# Patient Record
Sex: Female | Born: 1955 | Race: White | Hispanic: No | Smoking: Former smoker
Health system: Southern US, Community
[De-identification: ages and names within clinical notes are randomized; demographics above are authoritative.]

## PROBLEM LIST (undated history)

## (undated) DIAGNOSIS — E559 Vitamin D deficiency, unspecified: Secondary | ICD-10-CM

## (undated) DIAGNOSIS — Z9889 Other specified postprocedural states: Secondary | ICD-10-CM

## (undated) DIAGNOSIS — I447 Left bundle-branch block, unspecified: Secondary | ICD-10-CM

## (undated) DIAGNOSIS — Z95810 Presence of automatic (implantable) cardiac defibrillator: Secondary | ICD-10-CM

## (undated) DIAGNOSIS — I4891 Unspecified atrial fibrillation: Secondary | ICD-10-CM

## (undated) DIAGNOSIS — F419 Anxiety disorder, unspecified: Secondary | ICD-10-CM

## (undated) DIAGNOSIS — C73 Malignant neoplasm of thyroid gland: Secondary | ICD-10-CM

## (undated) DIAGNOSIS — G4710 Hypersomnia, unspecified: Secondary | ICD-10-CM

## (undated) DIAGNOSIS — R112 Nausea with vomiting, unspecified: Secondary | ICD-10-CM

## (undated) DIAGNOSIS — J449 Chronic obstructive pulmonary disease, unspecified: Secondary | ICD-10-CM

## (undated) DIAGNOSIS — E079 Disorder of thyroid, unspecified: Secondary | ICD-10-CM

## (undated) DIAGNOSIS — F32A Depression, unspecified: Secondary | ICD-10-CM

## (undated) DIAGNOSIS — E119 Type 2 diabetes mellitus without complications: Secondary | ICD-10-CM

## (undated) DIAGNOSIS — R7309 Other abnormal glucose: Secondary | ICD-10-CM

## (undated) DIAGNOSIS — E785 Hyperlipidemia, unspecified: Secondary | ICD-10-CM

## (undated) DIAGNOSIS — I10 Essential (primary) hypertension: Secondary | ICD-10-CM

## (undated) DIAGNOSIS — F329 Major depressive disorder, single episode, unspecified: Secondary | ICD-10-CM

## (undated) DIAGNOSIS — R569 Unspecified convulsions: Secondary | ICD-10-CM

## (undated) DIAGNOSIS — N63 Unspecified lump in unspecified breast: Secondary | ICD-10-CM

## (undated) DIAGNOSIS — I428 Other cardiomyopathies: Secondary | ICD-10-CM

## (undated) DIAGNOSIS — J45909 Unspecified asthma, uncomplicated: Secondary | ICD-10-CM

## (undated) DIAGNOSIS — Z950 Presence of cardiac pacemaker: Secondary | ICD-10-CM

## (undated) DIAGNOSIS — I456 Pre-excitation syndrome: Secondary | ICD-10-CM

## (undated) HISTORY — PX: ABDOMINAL HYSTERECTOMY: SHX81

## (undated) HISTORY — PX: CHOLECYSTECTOMY: SHX55

## (undated) HISTORY — DX: Anxiety disorder, unspecified: F41.9

## (undated) HISTORY — DX: Disorder of thyroid, unspecified: E07.9

## (undated) HISTORY — PX: COLONOSCOPY: SHX174

## (undated) HISTORY — DX: Depression, unspecified: F32.A

## (undated) HISTORY — DX: Other cardiomyopathies: I42.8

## (undated) HISTORY — DX: Left bundle-branch block, unspecified: I44.7

## (undated) HISTORY — DX: Unspecified lump in unspecified breast: N63.0

## (undated) HISTORY — DX: Major depressive disorder, single episode, unspecified: F32.9

## (undated) HISTORY — DX: Essential (primary) hypertension: I10

## (undated) HISTORY — DX: Hyperlipidemia, unspecified: E78.5

## (undated) HISTORY — DX: Vitamin D deficiency, unspecified: E55.9

## (undated) HISTORY — DX: Presence of automatic (implantable) cardiac defibrillator: Z95.810

## (undated) HISTORY — PX: JOINT REPLACEMENT: SHX530

## (undated) HISTORY — DX: Hypersomnia, unspecified: G47.10

## (undated) HISTORY — DX: Other abnormal glucose: R73.09

## (undated) HISTORY — PX: SHOULDER SURGERY: SHX246

## (undated) HISTORY — PX: CARDIAC VALVE REPLACEMENT: SHX585

## (undated) HISTORY — DX: Malignant neoplasm of thyroid gland: C73

## (undated) HISTORY — PX: PACEMAKER PLACEMENT: SHX43

## (undated) HISTORY — DX: Unspecified atrial fibrillation: I48.91

---

## 2011-04-23 ENCOUNTER — Encounter: Payer: Self-pay | Admitting: Cardiothoracic Surgery

## 2011-04-23 ENCOUNTER — Institutional Professional Consult (permissible substitution) (INDEPENDENT_AMBULATORY_CARE_PROVIDER_SITE_OTHER): Payer: Medicare Other | Admitting: Cardiothoracic Surgery

## 2011-04-23 VITALS — BP 110/70 | HR 70 | Resp 18 | Ht 68.0 in | Wt 224.0 lb

## 2011-04-23 DIAGNOSIS — F411 Generalized anxiety disorder: Secondary | ICD-10-CM

## 2011-04-23 DIAGNOSIS — F419 Anxiety disorder, unspecified: Secondary | ICD-10-CM | POA: Insufficient documentation

## 2011-04-23 DIAGNOSIS — C73 Malignant neoplasm of thyroid gland: Secondary | ICD-10-CM

## 2011-04-23 DIAGNOSIS — N63 Unspecified lump in unspecified breast: Secondary | ICD-10-CM

## 2011-04-23 DIAGNOSIS — G4710 Hypersomnia, unspecified: Secondary | ICD-10-CM

## 2011-04-23 DIAGNOSIS — I428 Other cardiomyopathies: Secondary | ICD-10-CM

## 2011-04-23 DIAGNOSIS — I4891 Unspecified atrial fibrillation: Secondary | ICD-10-CM | POA: Insufficient documentation

## 2011-04-23 DIAGNOSIS — F329 Major depressive disorder, single episode, unspecified: Secondary | ICD-10-CM

## 2011-04-23 DIAGNOSIS — E785 Hyperlipidemia, unspecified: Secondary | ICD-10-CM

## 2011-04-23 DIAGNOSIS — R7309 Other abnormal glucose: Secondary | ICD-10-CM | POA: Insufficient documentation

## 2011-04-23 DIAGNOSIS — Z95810 Presence of automatic (implantable) cardiac defibrillator: Secondary | ICD-10-CM | POA: Insufficient documentation

## 2011-04-23 DIAGNOSIS — I10 Essential (primary) hypertension: Secondary | ICD-10-CM

## 2011-04-23 DIAGNOSIS — F32A Depression, unspecified: Secondary | ICD-10-CM | POA: Insufficient documentation

## 2011-04-23 DIAGNOSIS — E559 Vitamin D deficiency, unspecified: Secondary | ICD-10-CM | POA: Insufficient documentation

## 2011-04-23 DIAGNOSIS — E079 Disorder of thyroid, unspecified: Secondary | ICD-10-CM

## 2011-04-23 DIAGNOSIS — I447 Left bundle-branch block, unspecified: Secondary | ICD-10-CM | POA: Insufficient documentation

## 2011-04-23 NOTE — Progress Notes (Signed)
[address]            [place] [address]          [phone]      SHIMIKA AMES [facility] Medical Record #[id number] Date of Birth: December 31, 1955  Referring: Sandy Salaam, MD Primary Care: No primary provider on file.  Chief Complaint:    Chief Complaint  Patient presents with  . 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 Subsequently in 1999 underwent mitral valve replacement with a 27 MS-601 St. Jude's mechanical valve serial #[id number]. She has developed a cardiomyopathy with ejection fraction 25-30%.  4 years ago and AICD pacer was placed, attempt to cannulate the coronary sinus was unsuccessful. Because of the patient's increasing evidence of heart failure and decrease in LV function cardiology had recommended ointment of epicardial left ventricular pacing lead for biventricular pacing.         Past Medical History  Diagnosis Date  . Anxiety   . Depression   . Hypertension   . Hyperlipidemia   . Thyroid disease   . Other abnormal glucose   . Automatic implantable cardiac defibrillator in situ     Lumax DR-T 09-20-2007 Dr.Akbary  . Atrial fibrillation   . Other left bundle branch block   . Other primary cardiomyopathies   . 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 Lump or mass in breast     at the 7 o'clock position of right breast(non-tender)  . Unspecified vitamin D deficiency   . Thyroid cancer     Past Surgical History  Procedure Date  . Cardiac valve replacement     st jude silzone mitray mechanical 24ms-601 ss# [id number]  . Colonoscopy     April 12, 2008  . Pacemaker placement     Family History  Problem Relation Age of Onset  . Lung cancer Father     expired 58  . Sarcoidosis Mother    espired 2013    History   Social History  . Marital Status: Legally Separated    Spouse Name: N/A    Number of Children: N/A  . Years of Education: N/A   Occupational History  . Disabled for past 4 years   Social History Main Topics  . Smoking status: Former Smoker -- 1.0 packs/day for 25 years    Types: Cigarettes    Quit date: 10/17/2009  . Smokeless tobacco: Not on file  . Alcohol Use: No  . Drug Use: No  . Sexually Active: Not on file   Other Topics Concern  . Not on file   Social History Narrative  . No narrative on file    History  Smoking status  . Former Smoker -- 1.0 packs/day for 25 years  . Types: Cigarettes  . Quit date: 10/17/2009  Smokeless tobacco  . Not on file    History  Alcohol Use No     Allergies  Allergen Reactions  . Codeine     Current Outpatient Prescriptions  Medication Sig Dispense Refill  . ALPRAZolam (XANAX) 1 MG tablet Take 1 mg by mouth daily.      Marland Kitchen  amiodarone (PACERONE) 200 MG tablet Take 200 mg by mouth daily.      . carvedilol (COREG) 25 MG tablet Take 1/2 tablet q am and one tablet q pm      . CLOPIDOGREL BISULFATE PO Take 75 mg by mouth daily.      . fenofibrate 160 MG tablet Take 160 mg by mouth daily.      Marland Kitchen FLUOXETINE HCL PO Take 40 mg by mouth daily.      . furosemide (LASIX) 40 MG tablet Take 40 mg by mouth daily as needed.      Marland Kitchen LEVOTHYROXINE SODIUM PO Take 200 mcg by mouth daily.      Marland Kitchen lisinopril (PRINIVIL,ZESTRIL) 40 MG tablet Take 40 mg by mouth daily.      Marland Kitchen oxyCODONE-acetaminophen (PERCOCET) 7.5-325 MG per tablet Take 1 tablet by mouth every 6 (six) hours as needed.      . Potassium Chloride Crys CR (POTASSIUM CHLORIDE CRYS ER PO) Take 20 mcg by mouth daily as needed.      . simvastatin (ZOCOR) 40 MG tablet Take 40 mg by mouth every evening.      . warfarin (COUMADIN) 2 MG tablet 1 tablet daily except 1/2 tablet on Sunday & Wednesday           Review of Systems:     Cardiac Review of Systems: Y or  N  Chest Pain [ n   ]  Resting SOB [ n  ] Exertional SOB  [ y ]  Pollyann Kennedy Milo.Brash  ]   Pedal Edema [ n  ]    Palpitations [place].Etienne  ] Syncope  [ n ]   Presyncope [n  ]  General Review of Systems: [Y] = yes [  ]=no Constitional: recent weight change [y gain  25 lbs  ]; anorexia [  ]; fatigue [  ]; nausea [  ]; night sweats [  ]; fever [  ]; or chills [  ];                                                                                                                                          Dental: poor dentition[ n ];   Eye : blurred vision [  ]; diplopia [   ]; vision changes [  ];  Amaurosis fugax[  ]; Resp: cough [  ];  wheezing[  ];  hemoptysis[  ]; shortness of breath[  ]; paroxysmal nocturnal dyspnea[  ]; dyspnea on exertion[  ]; or orthopnea[  ];  GI:  gallstones[  ], vomiting[  ];  dysphagia[  ]; melena[  ];  hematochezia [  ]; heartburn[  ];   Hx of  Colonoscopy[  ]; GU: kidney stones [  ]; hematuria[  ];   dysuria [  ];  nocturia[  ];  history of     obstruction [  ];  Skin: rash, swelling[  ];, hair loss[  ];  peripheral edema[n];  or itching[  ]; Musculosketetal: myalgias[  ];  joint swelling[  ];  joint erythema[  ];  joint pain[  ];  back pain[  ];  Heme/Lymph: bruising[  ];  bleeding[  ];  anemia[  ];  Neuro: TIA[  ];  headaches[  ];  stroke[n  ];  vertigo[n  ];  seizures[n  ];   paresthesias[  ];  difficulty walking[  ];  Psych:depression[  ]; anxiety[  ];  Endocrine: diabetes[  ];  thyroid dysfunction[  ];  Immunizations: Flu [ n ]; Pneumococcal[ n ];  Other:  Physical Exam: BP 110/70  Pulse 70  Resp 18  Ht 5\' 8"  (1.727 m)  Wt 224 lb (101.606 kg)  BMI 34.06 kg/m2  General appearance: alert, cooperative and appears older than stated age Neurologic: intact Heart: irregularly irregular rhythm Lungs: clear to auscultation bilaterally Abdomen: soft, non-tender; bowel sounds normal; no masses,  no organomegaly Extremities: extremities normal, atraumatic, no cyanosis or  edema and Homans sign is negative, no sign of DVT AICD device well-healed in the left infraclavicular subcutaneous pocket  Diagnostic Studies & Laboratory data:     Recent Radiology Findings:   No results found.    Recent Lab Findings: No results found for this basename: WBC, HGB, HCT, PLT, GLUCOSE, CHOL, TRIG, HDL, LDLDIRECT, LDLCALC, ALT, AST, NA, K, CL, CREATININE, BUN, CO2, TSH, INR, GLUF, HGBA1C      Assessment / Plan:     The patient is seen preoperatively to discuss open placement of left ventricular epicardial lead for biventricular pacing. She is discussed with cardiology the need to revise the AICD device and put an additional lead to achieve biventricular pacing after failure to place a coronary sinus lead. I discussed in detail the risks and options of this procedure with the patient and her sisters.With her mechanical valve she will need bridging with Lovenox prior to surgery. She will stop her Plavix today, already has a scheduled appointment in the Coumadin clinic tomorrow depending her pro time will decide when to start Lovenox. I have coordinated with cardiology to proceed with surgery on March 26 Tuesday.  The goals risks and alternatives of the planned surgical procedure left video-assisted thoracoscopy minithoracotomy placement of epicardial pacing leads and revision of AICD device  have been discussed with the patient in detail. The risks of the procedure including death, infection, stroke, myocardial infarction, bleeding, blood transfusion have all been discussed specifically.  I have quoted Fabian November a 2 % of perioperative mortality and a complication rate as high as 15 %. The patient's questions have been answered.AKIAH BAUCH is willing  to proceed with the planned procedure.       Delight Ovens MD  Beeper [phone] Office [phone] 04/23/2011 6:02 PM

## 2011-04-28 DIAGNOSIS — I428 Other cardiomyopathies: Secondary | ICD-10-CM

## 2011-05-14 ENCOUNTER — Ambulatory Visit: Payer: Managed Care, Other (non HMO) | Admitting: Cardiothoracic Surgery

## 2011-05-15 ENCOUNTER — Institutional Professional Consult (permissible substitution) (INDEPENDENT_AMBULATORY_CARE_PROVIDER_SITE_OTHER): Payer: Medicare Other | Admitting: Cardiothoracic Surgery

## 2011-05-15 DIAGNOSIS — Z789 Other specified health status: Secondary | ICD-10-CM

## 2011-05-15 DIAGNOSIS — Z87898 Personal history of other specified conditions: Secondary | ICD-10-CM

## 2011-05-15 NOTE — Progress Notes (Signed)
[address]            [place] [address]          [phone]      Ana Martin [facility] Medical Record #[id number] Date of Birth: [birth year]  Referring: Dr Karren Burly Primary Care: No primary provider on file.  Chief Complaint:   POST OP FOLLOW UP  History of Present Illness:     Patient returns today after after left mini thoracotomy for placement of epicardial pacing leads and revision of the AICD. The patient has been on Coumadin and Plavix chronically with an implanted mechanical St. Jude valve. She notes she saw Dr. Karren Burly, 2 days ago and her AICD device and thresholds were reported as excellent. Just since discharge she has felt fine without any symptoms of congestive heart failure currently under current treatment regimen. She comes to the office today after going to the Coumadin clinic. She was told her INR/PT was "perfect".     Past Medical History  Diagnosis Date  . Anxiety   . Depression   . Hypertension   . Hyperlipidemia   . Thyroid disease   . Other abnormal glucose   . Automatic implantable cardiac defibrillator in situ     Lumax DR-T 09-20-2007 Dr.Akbary  . Atrial fibrillation   . Other left bundle branch block   . Other primary cardiomyopathies   . 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 Lump or mass in breast     at the 7 o'clock position of right breast(non-tender)  . Unspecified vitamin D deficiency   . Thyroid cancer      History  Smoking status  . Former Smoker -- 1.0 packs/day for 25 years  . Types: Cigarettes  . Quit date: 10/17/2009  Smokeless tobacco  . Not on file    History  Alcohol Use No     Allergies  Allergen Reactions  . Codeine     Current Outpatient Prescriptions  Medication Sig Dispense Refill  . ALPRAZolam (XANAX) 1 MG tablet Take 1 mg by mouth daily.      Marland Kitchen amiodarone (PACERONE) 200 MG tablet Take 200 mg by mouth daily.      . carvedilol (COREG) 25  MG tablet Take 1/2 tablet q am and one tablet q pm      . CLOPIDOGREL BISULFATE PO Take 75 mg by mouth daily.      . fenofibrate 160 MG tablet Take 160 mg by mouth daily.      Marland Kitchen FLUOXETINE HCL PO Take 40 mg by mouth daily.      . furosemide (LASIX) 40 MG tablet Take 40 mg by mouth daily as needed.      Marland Kitchen LEVOTHYROXINE SODIUM PO Take 200 mcg by mouth daily.      Marland Kitchen lisinopril (PRINIVIL,ZESTRIL) 40 MG tablet Take 40 mg by mouth daily.      Marland Kitchen oxyCODONE-acetaminophen (PERCOCET) 7.5-325 MG per tablet Take 1 tablet by mouth every 6 (six) hours as needed.      . Potassium Chloride Crys CR (POTASSIUM CHLORIDE CRYS ER PO) Take 20 mcg by mouth daily as needed.      . simvastatin (ZOCOR) 40 MG tablet Take 40 mg by mouth every evening.      . warfarin (COUMADIN) 2 MG tablet 1 tablet daily except 1/2 tablet on Sunday & Wednesday  Physical Exam: There were no vitals taken for this visit.  General appearance: alert, cooperative, appears older than stated age and no distress Neurologic: intact Heart: Valve sounds were crisp, no murmur of mitral insufficiency. With irregular heart rhythm Lungs: clear to auscultation bilaterally and normal percussion bilaterally Abdomen: soft, non-tender; bowel sounds normal; no masses,  no organomegaly Wound: The left infraclavicular wound is with some bruising, the wound is intact without evidence of infection or drainage the swelling and edema around the device present initially postop is almost resolved the left thoracotomy incision   Diagnostic Studies & Laboratory data:     Recent Radiology Findings:   chest x-[facility] was done in [facility] and shows clear lung fields and good position of leads there is no evidence of effusion,    Recent Lab Findings: No results found for this basename: WBC,  HGB,  HCT,  PLT,  GLUCOSE,  CHOL,  TRIG,  HDL,  LDLDIRECT,  LDLCALC,  ALT,  AST,  NA,  K,  CL,  CREATININE,  BUN,  CO2,  TSH,  INR,  GLUF,  HGBA1C       Assessment / Plan:    Patient is stable status post mini thoracotomy and placement of epicardial raising leads and revision of AICD device She will return to see [place] when necessary as requested by Dr. Karren Burly.        Delight Ovens MD  Beeper [phone] Office [phone] 05/15/2011 4:22 PM

## 2012-12-23 DIAGNOSIS — R569 Unspecified convulsions: Secondary | ICD-10-CM | POA: Insufficient documentation

## 2012-12-23 DIAGNOSIS — Z9889 Other specified postprocedural states: Secondary | ICD-10-CM | POA: Insufficient documentation

## 2012-12-23 DIAGNOSIS — Z952 Presence of prosthetic heart valve: Secondary | ICD-10-CM | POA: Insufficient documentation

## 2012-12-23 DIAGNOSIS — S42309A Unspecified fracture of shaft of humerus, unspecified arm, initial encounter for closed fracture: Secondary | ICD-10-CM | POA: Insufficient documentation

## 2012-12-29 DIAGNOSIS — D62 Acute posthemorrhagic anemia: Secondary | ICD-10-CM | POA: Insufficient documentation

## 2015-01-24 DIAGNOSIS — Z7901 Long term (current) use of anticoagulants: Secondary | ICD-10-CM | POA: Insufficient documentation

## 2015-02-06 DIAGNOSIS — R918 Other nonspecific abnormal finding of lung field: Secondary | ICD-10-CM | POA: Insufficient documentation

## 2015-02-06 DIAGNOSIS — R748 Abnormal levels of other serum enzymes: Secondary | ICD-10-CM | POA: Insufficient documentation

## 2015-02-06 DIAGNOSIS — G4733 Obstructive sleep apnea (adult) (pediatric): Secondary | ICD-10-CM | POA: Insufficient documentation

## 2015-02-06 DIAGNOSIS — R740 Nonspecific elevation of levels of transaminase and lactic acid dehydrogenase [LDH]: Secondary | ICD-10-CM

## 2015-02-06 DIAGNOSIS — R7989 Other specified abnormal findings of blood chemistry: Secondary | ICD-10-CM | POA: Insufficient documentation

## 2015-02-06 DIAGNOSIS — J45909 Unspecified asthma, uncomplicated: Secondary | ICD-10-CM | POA: Insufficient documentation

## 2015-02-06 DIAGNOSIS — R7401 Elevation of levels of liver transaminase levels: Secondary | ICD-10-CM | POA: Insufficient documentation

## 2015-02-06 DIAGNOSIS — I059 Rheumatic mitral valve disease, unspecified: Secondary | ICD-10-CM | POA: Insufficient documentation

## 2015-02-20 DIAGNOSIS — E890 Postprocedural hypothyroidism: Secondary | ICD-10-CM | POA: Insufficient documentation

## 2015-04-28 DIAGNOSIS — F339 Major depressive disorder, recurrent, unspecified: Secondary | ICD-10-CM | POA: Insufficient documentation

## 2015-04-28 DIAGNOSIS — I272 Pulmonary hypertension, unspecified: Secondary | ICD-10-CM | POA: Insufficient documentation

## 2015-04-28 DIAGNOSIS — I509 Heart failure, unspecified: Secondary | ICD-10-CM | POA: Insufficient documentation

## 2015-08-20 DIAGNOSIS — Z8679 Personal history of other diseases of the circulatory system: Secondary | ICD-10-CM | POA: Insufficient documentation

## 2016-01-16 DIAGNOSIS — Z85850 Personal history of malignant neoplasm of thyroid: Secondary | ICD-10-CM | POA: Insufficient documentation

## 2016-01-16 DIAGNOSIS — Z135 Encounter for screening for eye and ear disorders: Secondary | ICD-10-CM | POA: Insufficient documentation

## 2016-01-16 DIAGNOSIS — R7303 Prediabetes: Secondary | ICD-10-CM | POA: Insufficient documentation

## 2016-02-07 DIAGNOSIS — Z5181 Encounter for therapeutic drug level monitoring: Secondary | ICD-10-CM | POA: Diagnosis not present

## 2016-02-07 DIAGNOSIS — I272 Pulmonary hypertension, unspecified: Secondary | ICD-10-CM | POA: Diagnosis not present

## 2016-02-07 DIAGNOSIS — Z952 Presence of prosthetic heart valve: Secondary | ICD-10-CM | POA: Diagnosis not present

## 2016-02-07 DIAGNOSIS — Z9889 Other specified postprocedural states: Secondary | ICD-10-CM | POA: Diagnosis not present

## 2016-02-07 DIAGNOSIS — R791 Abnormal coagulation profile: Secondary | ICD-10-CM | POA: Diagnosis not present

## 2016-02-07 DIAGNOSIS — I4892 Unspecified atrial flutter: Secondary | ICD-10-CM | POA: Diagnosis not present

## 2016-02-07 DIAGNOSIS — Z8679 Personal history of other diseases of the circulatory system: Secondary | ICD-10-CM | POA: Diagnosis not present

## 2016-02-07 DIAGNOSIS — I5022 Chronic systolic (congestive) heart failure: Secondary | ICD-10-CM | POA: Diagnosis not present

## 2016-02-07 DIAGNOSIS — Z95810 Presence of automatic (implantable) cardiac defibrillator: Secondary | ICD-10-CM | POA: Diagnosis not present

## 2016-02-07 DIAGNOSIS — G4733 Obstructive sleep apnea (adult) (pediatric): Secondary | ICD-10-CM | POA: Diagnosis not present

## 2016-02-07 DIAGNOSIS — I10 Essential (primary) hypertension: Secondary | ICD-10-CM | POA: Diagnosis not present

## 2016-02-07 DIAGNOSIS — Z7901 Long term (current) use of anticoagulants: Secondary | ICD-10-CM | POA: Diagnosis not present

## 2016-02-07 DIAGNOSIS — I059 Rheumatic mitral valve disease, unspecified: Secondary | ICD-10-CM | POA: Diagnosis not present

## 2016-02-07 DIAGNOSIS — Z4502 Encounter for adjustment and management of automatic implantable cardiac defibrillator: Secondary | ICD-10-CM | POA: Diagnosis not present

## 2016-02-11 DIAGNOSIS — Z5181 Encounter for therapeutic drug level monitoring: Secondary | ICD-10-CM | POA: Diagnosis not present

## 2016-02-11 DIAGNOSIS — Z952 Presence of prosthetic heart valve: Secondary | ICD-10-CM | POA: Diagnosis not present

## 2016-02-11 DIAGNOSIS — Z7901 Long term (current) use of anticoagulants: Secondary | ICD-10-CM | POA: Diagnosis not present

## 2016-02-11 DIAGNOSIS — I059 Rheumatic mitral valve disease, unspecified: Secondary | ICD-10-CM | POA: Diagnosis not present

## 2016-02-27 DIAGNOSIS — I059 Rheumatic mitral valve disease, unspecified: Secondary | ICD-10-CM | POA: Diagnosis not present

## 2016-02-27 DIAGNOSIS — Z952 Presence of prosthetic heart valve: Secondary | ICD-10-CM | POA: Diagnosis not present

## 2016-02-27 DIAGNOSIS — Z5181 Encounter for therapeutic drug level monitoring: Secondary | ICD-10-CM | POA: Diagnosis not present

## 2016-03-17 DIAGNOSIS — I059 Rheumatic mitral valve disease, unspecified: Secondary | ICD-10-CM | POA: Diagnosis not present

## 2016-03-17 DIAGNOSIS — Z5181 Encounter for therapeutic drug level monitoring: Secondary | ICD-10-CM | POA: Diagnosis not present

## 2016-03-17 DIAGNOSIS — Z952 Presence of prosthetic heart valve: Secondary | ICD-10-CM | POA: Diagnosis not present

## 2016-04-17 DIAGNOSIS — Z952 Presence of prosthetic heart valve: Secondary | ICD-10-CM | POA: Diagnosis not present

## 2016-04-17 DIAGNOSIS — Z5181 Encounter for therapeutic drug level monitoring: Secondary | ICD-10-CM | POA: Diagnosis not present

## 2016-04-17 DIAGNOSIS — Z7901 Long term (current) use of anticoagulants: Secondary | ICD-10-CM | POA: Diagnosis not present

## 2016-04-18 DIAGNOSIS — Z8679 Personal history of other diseases of the circulatory system: Secondary | ICD-10-CM | POA: Diagnosis not present

## 2016-04-18 DIAGNOSIS — I5022 Chronic systolic (congestive) heart failure: Secondary | ICD-10-CM | POA: Diagnosis not present

## 2016-04-18 DIAGNOSIS — I059 Rheumatic mitral valve disease, unspecified: Secondary | ICD-10-CM | POA: Diagnosis not present

## 2016-04-18 DIAGNOSIS — I471 Supraventricular tachycardia: Secondary | ICD-10-CM | POA: Diagnosis not present

## 2016-04-18 DIAGNOSIS — Z95810 Presence of automatic (implantable) cardiac defibrillator: Secondary | ICD-10-CM | POA: Diagnosis not present

## 2016-04-18 DIAGNOSIS — Z9889 Other specified postprocedural states: Secondary | ICD-10-CM | POA: Diagnosis not present

## 2016-04-18 DIAGNOSIS — Z7901 Long term (current) use of anticoagulants: Secondary | ICD-10-CM | POA: Diagnosis not present

## 2016-04-18 DIAGNOSIS — I4892 Unspecified atrial flutter: Secondary | ICD-10-CM | POA: Diagnosis not present

## 2016-04-18 DIAGNOSIS — I10 Essential (primary) hypertension: Secondary | ICD-10-CM | POA: Diagnosis not present

## 2016-04-24 DIAGNOSIS — I4892 Unspecified atrial flutter: Secondary | ICD-10-CM | POA: Diagnosis not present

## 2016-04-24 DIAGNOSIS — Z9889 Other specified postprocedural states: Secondary | ICD-10-CM | POA: Diagnosis not present

## 2016-04-24 DIAGNOSIS — Z95810 Presence of automatic (implantable) cardiac defibrillator: Secondary | ICD-10-CM | POA: Diagnosis not present

## 2016-04-24 DIAGNOSIS — I10 Essential (primary) hypertension: Secondary | ICD-10-CM | POA: Diagnosis not present

## 2016-04-24 DIAGNOSIS — Z7901 Long term (current) use of anticoagulants: Secondary | ICD-10-CM | POA: Diagnosis not present

## 2016-04-24 DIAGNOSIS — I5022 Chronic systolic (congestive) heart failure: Secondary | ICD-10-CM | POA: Diagnosis not present

## 2016-04-24 DIAGNOSIS — I4891 Unspecified atrial fibrillation: Secondary | ICD-10-CM | POA: Diagnosis not present

## 2016-04-24 DIAGNOSIS — Z8679 Personal history of other diseases of the circulatory system: Secondary | ICD-10-CM | POA: Diagnosis not present

## 2016-05-08 DIAGNOSIS — I10 Essential (primary) hypertension: Secondary | ICD-10-CM | POA: Diagnosis not present

## 2016-05-08 DIAGNOSIS — I059 Rheumatic mitral valve disease, unspecified: Secondary | ICD-10-CM | POA: Diagnosis not present

## 2016-05-08 DIAGNOSIS — Z5181 Encounter for therapeutic drug level monitoring: Secondary | ICD-10-CM | POA: Diagnosis not present

## 2016-05-08 DIAGNOSIS — I4891 Unspecified atrial fibrillation: Secondary | ICD-10-CM | POA: Diagnosis not present

## 2016-05-08 DIAGNOSIS — Z952 Presence of prosthetic heart valve: Secondary | ICD-10-CM | POA: Diagnosis not present

## 2016-05-08 DIAGNOSIS — I4892 Unspecified atrial flutter: Secondary | ICD-10-CM | POA: Diagnosis not present

## 2016-05-08 DIAGNOSIS — Z9889 Other specified postprocedural states: Secondary | ICD-10-CM | POA: Diagnosis not present

## 2016-05-08 DIAGNOSIS — Z7901 Long term (current) use of anticoagulants: Secondary | ICD-10-CM | POA: Diagnosis not present

## 2016-05-08 DIAGNOSIS — I5022 Chronic systolic (congestive) heart failure: Secondary | ICD-10-CM | POA: Diagnosis not present

## 2016-05-08 DIAGNOSIS — Z8679 Personal history of other diseases of the circulatory system: Secondary | ICD-10-CM | POA: Diagnosis not present

## 2016-05-08 DIAGNOSIS — Z95810 Presence of automatic (implantable) cardiac defibrillator: Secondary | ICD-10-CM | POA: Diagnosis not present

## 2016-06-19 DIAGNOSIS — H5213 Myopia, bilateral: Secondary | ICD-10-CM | POA: Diagnosis not present

## 2016-06-19 DIAGNOSIS — H52223 Regular astigmatism, bilateral: Secondary | ICD-10-CM | POA: Diagnosis not present

## 2016-06-19 DIAGNOSIS — H524 Presbyopia: Secondary | ICD-10-CM | POA: Diagnosis not present

## 2016-06-25 DIAGNOSIS — I059 Rheumatic mitral valve disease, unspecified: Secondary | ICD-10-CM | POA: Diagnosis not present

## 2016-06-25 DIAGNOSIS — Z952 Presence of prosthetic heart valve: Secondary | ICD-10-CM | POA: Diagnosis not present

## 2016-06-25 DIAGNOSIS — Z5181 Encounter for therapeutic drug level monitoring: Secondary | ICD-10-CM | POA: Diagnosis not present

## 2016-06-25 DIAGNOSIS — Z7901 Long term (current) use of anticoagulants: Secondary | ICD-10-CM | POA: Diagnosis not present

## 2016-07-16 DIAGNOSIS — F324 Major depressive disorder, single episode, in partial remission: Secondary | ICD-10-CM | POA: Diagnosis not present

## 2016-07-16 DIAGNOSIS — E669 Obesity, unspecified: Secondary | ICD-10-CM | POA: Insufficient documentation

## 2016-07-16 DIAGNOSIS — E785 Hyperlipidemia, unspecified: Secondary | ICD-10-CM | POA: Diagnosis not present

## 2016-07-16 DIAGNOSIS — Z1159 Encounter for screening for other viral diseases: Secondary | ICD-10-CM | POA: Diagnosis not present

## 2016-07-16 DIAGNOSIS — R7303 Prediabetes: Secondary | ICD-10-CM | POA: Diagnosis not present

## 2016-07-16 DIAGNOSIS — F411 Generalized anxiety disorder: Secondary | ICD-10-CM | POA: Diagnosis not present

## 2016-07-16 DIAGNOSIS — R41840 Attention and concentration deficit: Secondary | ICD-10-CM | POA: Diagnosis not present

## 2016-07-16 DIAGNOSIS — I10 Essential (primary) hypertension: Secondary | ICD-10-CM | POA: Diagnosis not present

## 2016-07-16 DIAGNOSIS — Z23 Encounter for immunization: Secondary | ICD-10-CM | POA: Diagnosis not present

## 2016-07-16 DIAGNOSIS — E039 Hypothyroidism, unspecified: Secondary | ICD-10-CM | POA: Diagnosis not present

## 2016-07-30 DIAGNOSIS — Z5181 Encounter for therapeutic drug level monitoring: Secondary | ICD-10-CM | POA: Diagnosis not present

## 2016-07-30 DIAGNOSIS — I059 Rheumatic mitral valve disease, unspecified: Secondary | ICD-10-CM | POA: Diagnosis not present

## 2016-07-30 DIAGNOSIS — Z952 Presence of prosthetic heart valve: Secondary | ICD-10-CM | POA: Diagnosis not present

## 2016-07-30 DIAGNOSIS — Z7901 Long term (current) use of anticoagulants: Secondary | ICD-10-CM | POA: Diagnosis not present

## 2016-08-29 DIAGNOSIS — I059 Rheumatic mitral valve disease, unspecified: Secondary | ICD-10-CM | POA: Diagnosis not present

## 2016-08-29 DIAGNOSIS — R791 Abnormal coagulation profile: Secondary | ICD-10-CM | POA: Diagnosis not present

## 2016-08-29 DIAGNOSIS — Z7901 Long term (current) use of anticoagulants: Secondary | ICD-10-CM | POA: Diagnosis not present

## 2016-08-29 DIAGNOSIS — Z5181 Encounter for therapeutic drug level monitoring: Secondary | ICD-10-CM | POA: Diagnosis not present

## 2016-09-17 DIAGNOSIS — I059 Rheumatic mitral valve disease, unspecified: Secondary | ICD-10-CM | POA: Diagnosis not present

## 2016-09-17 DIAGNOSIS — Z7901 Long term (current) use of anticoagulants: Secondary | ICD-10-CM | POA: Diagnosis not present

## 2016-09-17 DIAGNOSIS — Z952 Presence of prosthetic heart valve: Secondary | ICD-10-CM | POA: Diagnosis not present

## 2016-09-17 DIAGNOSIS — Z5181 Encounter for therapeutic drug level monitoring: Secondary | ICD-10-CM | POA: Diagnosis not present

## 2016-10-31 DIAGNOSIS — Z952 Presence of prosthetic heart valve: Secondary | ICD-10-CM | POA: Diagnosis not present

## 2016-10-31 DIAGNOSIS — Z5181 Encounter for therapeutic drug level monitoring: Secondary | ICD-10-CM | POA: Diagnosis not present

## 2016-10-31 DIAGNOSIS — I059 Rheumatic mitral valve disease, unspecified: Secondary | ICD-10-CM | POA: Diagnosis not present

## 2016-10-31 DIAGNOSIS — R791 Abnormal coagulation profile: Secondary | ICD-10-CM | POA: Diagnosis not present

## 2016-11-03 DIAGNOSIS — I10 Essential (primary) hypertension: Secondary | ICD-10-CM | POA: Diagnosis not present

## 2016-11-03 DIAGNOSIS — F411 Generalized anxiety disorder: Secondary | ICD-10-CM | POA: Diagnosis not present

## 2016-11-03 DIAGNOSIS — Z23 Encounter for immunization: Secondary | ICD-10-CM | POA: Diagnosis not present

## 2016-11-03 DIAGNOSIS — E039 Hypothyroidism, unspecified: Secondary | ICD-10-CM | POA: Diagnosis not present

## 2016-11-03 DIAGNOSIS — E785 Hyperlipidemia, unspecified: Secondary | ICD-10-CM | POA: Diagnosis not present

## 2016-12-02 DIAGNOSIS — Z7901 Long term (current) use of anticoagulants: Secondary | ICD-10-CM | POA: Diagnosis not present

## 2016-12-02 DIAGNOSIS — Z952 Presence of prosthetic heart valve: Secondary | ICD-10-CM | POA: Diagnosis not present

## 2016-12-02 DIAGNOSIS — I059 Rheumatic mitral valve disease, unspecified: Secondary | ICD-10-CM | POA: Diagnosis not present

## 2016-12-02 DIAGNOSIS — Z5181 Encounter for therapeutic drug level monitoring: Secondary | ICD-10-CM | POA: Diagnosis not present

## 2017-01-09 DIAGNOSIS — Z5181 Encounter for therapeutic drug level monitoring: Secondary | ICD-10-CM | POA: Diagnosis not present

## 2017-01-09 DIAGNOSIS — I059 Rheumatic mitral valve disease, unspecified: Secondary | ICD-10-CM | POA: Diagnosis not present

## 2017-01-09 DIAGNOSIS — Z952 Presence of prosthetic heart valve: Secondary | ICD-10-CM | POA: Diagnosis not present

## 2017-01-09 DIAGNOSIS — Z7901 Long term (current) use of anticoagulants: Secondary | ICD-10-CM | POA: Diagnosis not present

## 2017-01-29 ENCOUNTER — Other Ambulatory Visit: Payer: Self-pay | Admitting: Pharmacy Technician

## 2017-01-29 NOTE — Patient Outreach (Signed)
[place] [facility]) Care Management  01/29/2017  KAMYRAH FEESER 1955-07-13 [id number]  Incoming [employer] EMMI call in reference to medication adherence. HIPAA identifier's verified and verbal consent received. Patient states she takes all of her medications daily as prescribed and does not have any barriers that would affect her adherence. She has an appointment scheduled on 1/11 and will speak to her provider at that time about getting new prescriptions for 3 month supplies sent in to her pharmacy.   Doreene Burke, [place] [phone]

## 2017-02-06 DIAGNOSIS — Z5181 Encounter for therapeutic drug level monitoring: Secondary | ICD-10-CM | POA: Diagnosis not present

## 2017-02-06 DIAGNOSIS — Z7901 Long term (current) use of anticoagulants: Secondary | ICD-10-CM | POA: Diagnosis not present

## 2017-02-06 DIAGNOSIS — I059 Rheumatic mitral valve disease, unspecified: Secondary | ICD-10-CM | POA: Diagnosis not present

## 2017-02-06 DIAGNOSIS — Z952 Presence of prosthetic heart valve: Secondary | ICD-10-CM | POA: Diagnosis not present

## 2017-02-13 DIAGNOSIS — C44712 Basal cell carcinoma of skin of right lower limb, including hip: Secondary | ICD-10-CM | POA: Diagnosis not present

## 2017-02-13 DIAGNOSIS — I10 Essential (primary) hypertension: Secondary | ICD-10-CM | POA: Diagnosis not present

## 2017-02-13 DIAGNOSIS — C44792 Other specified malignant neoplasm of skin of right lower limb, including hip: Secondary | ICD-10-CM | POA: Diagnosis not present

## 2017-02-13 DIAGNOSIS — R7303 Prediabetes: Secondary | ICD-10-CM | POA: Diagnosis not present

## 2017-02-13 DIAGNOSIS — F324 Major depressive disorder, single episode, in partial remission: Secondary | ICD-10-CM | POA: Diagnosis not present

## 2017-02-13 DIAGNOSIS — E785 Hyperlipidemia, unspecified: Secondary | ICD-10-CM | POA: Diagnosis not present

## 2017-02-26 DIAGNOSIS — C44712 Basal cell carcinoma of skin of right lower limb, including hip: Secondary | ICD-10-CM | POA: Diagnosis not present

## 2017-02-26 DIAGNOSIS — I10 Essential (primary) hypertension: Secondary | ICD-10-CM | POA: Diagnosis not present

## 2017-02-26 DIAGNOSIS — N289 Disorder of kidney and ureter, unspecified: Secondary | ICD-10-CM | POA: Diagnosis not present

## 2017-02-26 DIAGNOSIS — J069 Acute upper respiratory infection, unspecified: Secondary | ICD-10-CM | POA: Diagnosis not present

## 2017-02-26 DIAGNOSIS — R748 Abnormal levels of other serum enzymes: Secondary | ICD-10-CM | POA: Diagnosis not present

## 2017-03-13 DIAGNOSIS — I5022 Chronic systolic (congestive) heart failure: Secondary | ICD-10-CM | POA: Diagnosis not present

## 2017-03-13 DIAGNOSIS — I059 Rheumatic mitral valve disease, unspecified: Secondary | ICD-10-CM | POA: Diagnosis not present

## 2017-03-13 DIAGNOSIS — Z7901 Long term (current) use of anticoagulants: Secondary | ICD-10-CM | POA: Diagnosis not present

## 2017-03-13 DIAGNOSIS — Z4502 Encounter for adjustment and management of automatic implantable cardiac defibrillator: Secondary | ICD-10-CM | POA: Diagnosis not present

## 2017-03-13 DIAGNOSIS — Z952 Presence of prosthetic heart valve: Secondary | ICD-10-CM | POA: Diagnosis not present

## 2017-03-13 DIAGNOSIS — Z5181 Encounter for therapeutic drug level monitoring: Secondary | ICD-10-CM | POA: Diagnosis not present

## 2017-04-14 DIAGNOSIS — Z7901 Long term (current) use of anticoagulants: Secondary | ICD-10-CM | POA: Diagnosis not present

## 2017-04-14 DIAGNOSIS — I110 Hypertensive heart disease with heart failure: Secondary | ICD-10-CM | POA: Diagnosis not present

## 2017-04-14 DIAGNOSIS — Z5181 Encounter for therapeutic drug level monitoring: Secondary | ICD-10-CM | POA: Diagnosis not present

## 2017-04-14 DIAGNOSIS — I5022 Chronic systolic (congestive) heart failure: Secondary | ICD-10-CM | POA: Diagnosis not present

## 2017-04-14 DIAGNOSIS — I059 Rheumatic mitral valve disease, unspecified: Secondary | ICD-10-CM | POA: Diagnosis not present

## 2017-04-14 DIAGNOSIS — Z952 Presence of prosthetic heart valve: Secondary | ICD-10-CM | POA: Diagnosis not present

## 2017-04-14 DIAGNOSIS — I480 Paroxysmal atrial fibrillation: Secondary | ICD-10-CM | POA: Diagnosis not present

## 2017-05-20 DIAGNOSIS — Z7901 Long term (current) use of anticoagulants: Secondary | ICD-10-CM | POA: Diagnosis not present

## 2017-05-20 DIAGNOSIS — Z952 Presence of prosthetic heart valve: Secondary | ICD-10-CM | POA: Diagnosis not present

## 2017-05-20 DIAGNOSIS — I059 Rheumatic mitral valve disease, unspecified: Secondary | ICD-10-CM | POA: Diagnosis not present

## 2017-05-20 DIAGNOSIS — Z5181 Encounter for therapeutic drug level monitoring: Secondary | ICD-10-CM | POA: Diagnosis not present

## 2017-06-11 DIAGNOSIS — I509 Heart failure, unspecified: Secondary | ICD-10-CM | POA: Diagnosis not present

## 2017-06-11 DIAGNOSIS — Z95810 Presence of automatic (implantable) cardiac defibrillator: Secondary | ICD-10-CM | POA: Diagnosis not present

## 2017-07-02 DIAGNOSIS — I059 Rheumatic mitral valve disease, unspecified: Secondary | ICD-10-CM | POA: Diagnosis not present

## 2017-07-02 DIAGNOSIS — Z952 Presence of prosthetic heart valve: Secondary | ICD-10-CM | POA: Diagnosis not present

## 2017-07-02 DIAGNOSIS — Z7901 Long term (current) use of anticoagulants: Secondary | ICD-10-CM | POA: Diagnosis not present

## 2017-07-02 DIAGNOSIS — I480 Paroxysmal atrial fibrillation: Secondary | ICD-10-CM | POA: Diagnosis not present

## 2017-07-21 DIAGNOSIS — R7303 Prediabetes: Secondary | ICD-10-CM | POA: Diagnosis not present

## 2017-07-21 DIAGNOSIS — I10 Essential (primary) hypertension: Secondary | ICD-10-CM | POA: Diagnosis not present

## 2017-07-21 DIAGNOSIS — F411 Generalized anxiety disorder: Secondary | ICD-10-CM | POA: Diagnosis not present

## 2017-07-21 DIAGNOSIS — F324 Major depressive disorder, single episode, in partial remission: Secondary | ICD-10-CM | POA: Diagnosis not present

## 2017-07-21 DIAGNOSIS — E039 Hypothyroidism, unspecified: Secondary | ICD-10-CM | POA: Diagnosis not present

## 2017-08-30 ENCOUNTER — Other Ambulatory Visit: Payer: Self-pay

## 2017-08-30 ENCOUNTER — Emergency Department (HOSPITAL_COMMUNITY): Payer: PPO

## 2017-08-30 ENCOUNTER — Encounter (HOSPITAL_COMMUNITY): Payer: Self-pay | Admitting: Emergency Medicine

## 2017-08-30 ENCOUNTER — Inpatient Hospital Stay (HOSPITAL_COMMUNITY)
Admission: EM | Admit: 2017-08-30 | Discharge: 2017-09-04 | DRG: 917 | Disposition: A | Discharge status: Other Acute Inpatient Hospital | Payer: PPO | Attending: Internal Medicine | Admitting: Internal Medicine

## 2017-08-30 DIAGNOSIS — Z885 Allergy status to narcotic agent status: Secondary | ICD-10-CM

## 2017-08-30 DIAGNOSIS — F419 Anxiety disorder, unspecified: Secondary | ICD-10-CM | POA: Diagnosis present

## 2017-08-30 DIAGNOSIS — T424X2D Poisoning by benzodiazepines, intentional self-harm, subsequent encounter: Secondary | ICD-10-CM | POA: Diagnosis not present

## 2017-08-30 DIAGNOSIS — G92 Toxic encephalopathy: Secondary | ICD-10-CM | POA: Diagnosis present

## 2017-08-30 DIAGNOSIS — M542 Cervicalgia: Secondary | ICD-10-CM | POA: Diagnosis not present

## 2017-08-30 DIAGNOSIS — I429 Cardiomyopathy, unspecified: Secondary | ICD-10-CM | POA: Diagnosis present

## 2017-08-30 DIAGNOSIS — T45511A Poisoning by anticoagulants, accidental (unintentional), initial encounter: Secondary | ICD-10-CM | POA: Diagnosis not present

## 2017-08-30 DIAGNOSIS — Z801 Family history of malignant neoplasm of trachea, bronchus and lung: Secondary | ICD-10-CM

## 2017-08-30 DIAGNOSIS — E039 Hypothyroidism, unspecified: Secondary | ICD-10-CM | POA: Diagnosis present

## 2017-08-30 DIAGNOSIS — I10 Essential (primary) hypertension: Secondary | ICD-10-CM | POA: Diagnosis present

## 2017-08-30 DIAGNOSIS — I447 Left bundle-branch block, unspecified: Secondary | ICD-10-CM | POA: Diagnosis present

## 2017-08-30 DIAGNOSIS — G4710 Hypersomnia, unspecified: Secondary | ICD-10-CM | POA: Diagnosis present

## 2017-08-30 DIAGNOSIS — T45515A Adverse effect of anticoagulants, initial encounter: Secondary | ICD-10-CM | POA: Diagnosis present

## 2017-08-30 DIAGNOSIS — Z7901 Long term (current) use of anticoagulants: Secondary | ICD-10-CM | POA: Diagnosis not present

## 2017-08-30 DIAGNOSIS — E559 Vitamin D deficiency, unspecified: Secondary | ICD-10-CM | POA: Diagnosis present

## 2017-08-30 DIAGNOSIS — I4891 Unspecified atrial fibrillation: Secondary | ICD-10-CM | POA: Diagnosis present

## 2017-08-30 DIAGNOSIS — S3991XA Unspecified injury of abdomen, initial encounter: Secondary | ICD-10-CM | POA: Diagnosis not present

## 2017-08-30 DIAGNOSIS — E785 Hyperlipidemia, unspecified: Secondary | ICD-10-CM | POA: Diagnosis present

## 2017-08-30 DIAGNOSIS — Z23 Encounter for immunization: Secondary | ICD-10-CM

## 2017-08-30 DIAGNOSIS — I361 Nonrheumatic tricuspid (valve) insufficiency: Secondary | ICD-10-CM | POA: Diagnosis not present

## 2017-08-30 DIAGNOSIS — Z6379 Other stressful life events affecting family and household: Secondary | ICD-10-CM | POA: Diagnosis not present

## 2017-08-30 DIAGNOSIS — I428 Other cardiomyopathies: Secondary | ICD-10-CM | POA: Diagnosis not present

## 2017-08-30 DIAGNOSIS — T424X4A Poisoning by benzodiazepines, undetermined, initial encounter: Secondary | ICD-10-CM | POA: Diagnosis not present

## 2017-08-30 DIAGNOSIS — Z915 Personal history of self-harm: Secondary | ICD-10-CM | POA: Diagnosis not present

## 2017-08-30 DIAGNOSIS — Z79890 Hormone replacement therapy (postmenopausal): Secondary | ICD-10-CM

## 2017-08-30 DIAGNOSIS — Z85850 Personal history of malignant neoplasm of thyroid: Secondary | ICD-10-CM | POA: Diagnosis not present

## 2017-08-30 DIAGNOSIS — D689 Coagulation defect, unspecified: Secondary | ICD-10-CM | POA: Diagnosis present

## 2017-08-30 DIAGNOSIS — J441 Chronic obstructive pulmonary disease with (acute) exacerbation: Secondary | ICD-10-CM | POA: Diagnosis present

## 2017-08-30 DIAGNOSIS — Z95810 Presence of automatic (implantable) cardiac defibrillator: Secondary | ICD-10-CM

## 2017-08-30 DIAGNOSIS — R791 Abnormal coagulation profile: Secondary | ICD-10-CM | POA: Diagnosis not present

## 2017-08-30 DIAGNOSIS — S20212A Contusion of left front wall of thorax, initial encounter: Secondary | ICD-10-CM | POA: Diagnosis not present

## 2017-08-30 DIAGNOSIS — F329 Major depressive disorder, single episode, unspecified: Secondary | ICD-10-CM | POA: Diagnosis present

## 2017-08-30 DIAGNOSIS — Z952 Presence of prosthetic heart valve: Secondary | ICD-10-CM | POA: Diagnosis not present

## 2017-08-30 DIAGNOSIS — R52 Pain, unspecified: Secondary | ICD-10-CM | POA: Diagnosis not present

## 2017-08-30 DIAGNOSIS — W19XXXA Unspecified fall, initial encounter: Secondary | ICD-10-CM | POA: Diagnosis not present

## 2017-08-30 DIAGNOSIS — R404 Transient alteration of awareness: Secondary | ICD-10-CM | POA: Diagnosis not present

## 2017-08-30 DIAGNOSIS — I456 Pre-excitation syndrome: Secondary | ICD-10-CM | POA: Diagnosis present

## 2017-08-30 DIAGNOSIS — J9601 Acute respiratory failure with hypoxia: Secondary | ICD-10-CM | POA: Diagnosis present

## 2017-08-30 DIAGNOSIS — G4700 Insomnia, unspecified: Secondary | ICD-10-CM | POA: Diagnosis not present

## 2017-08-30 DIAGNOSIS — W1830XA Fall on same level, unspecified, initial encounter: Secondary | ICD-10-CM | POA: Diagnosis present

## 2017-08-30 DIAGNOSIS — Y92009 Unspecified place in unspecified non-institutional (private) residence as the place of occurrence of the external cause: Secondary | ICD-10-CM

## 2017-08-30 DIAGNOSIS — T424X1A Poisoning by benzodiazepines, accidental (unintentional), initial encounter: Secondary | ICD-10-CM | POA: Diagnosis not present

## 2017-08-30 DIAGNOSIS — J449 Chronic obstructive pulmonary disease, unspecified: Secondary | ICD-10-CM | POA: Diagnosis present

## 2017-08-30 DIAGNOSIS — S20219A Contusion of unspecified front wall of thorax, initial encounter: Secondary | ICD-10-CM | POA: Diagnosis present

## 2017-08-30 DIAGNOSIS — M25512 Pain in left shoulder: Secondary | ICD-10-CM | POA: Diagnosis not present

## 2017-08-30 DIAGNOSIS — Z90710 Acquired absence of both cervix and uterus: Secondary | ICD-10-CM | POA: Diagnosis not present

## 2017-08-30 DIAGNOSIS — R4020 Unspecified coma: Secondary | ICD-10-CM | POA: Diagnosis not present

## 2017-08-30 DIAGNOSIS — F332 Major depressive disorder, recurrent severe without psychotic features: Secondary | ICD-10-CM | POA: Diagnosis not present

## 2017-08-30 DIAGNOSIS — S40022A Contusion of left upper arm, initial encounter: Secondary | ICD-10-CM | POA: Diagnosis present

## 2017-08-30 DIAGNOSIS — Z87891 Personal history of nicotine dependence: Secondary | ICD-10-CM | POA: Diagnosis not present

## 2017-08-30 DIAGNOSIS — R55 Syncope and collapse: Secondary | ICD-10-CM | POA: Diagnosis present

## 2017-08-30 DIAGNOSIS — Z79899 Other long term (current) drug therapy: Secondary | ICD-10-CM

## 2017-08-30 DIAGNOSIS — S42302D Unspecified fracture of shaft of humerus, left arm, subsequent encounter for fracture with routine healing: Secondary | ICD-10-CM | POA: Diagnosis not present

## 2017-08-30 DIAGNOSIS — T424X2A Poisoning by benzodiazepines, intentional self-harm, initial encounter: Secondary | ICD-10-CM | POA: Diagnosis present

## 2017-08-30 DIAGNOSIS — R4182 Altered mental status, unspecified: Secondary | ICD-10-CM | POA: Diagnosis present

## 2017-08-30 DIAGNOSIS — R0781 Pleurodynia: Secondary | ICD-10-CM | POA: Diagnosis present

## 2017-08-30 DIAGNOSIS — T1491XA Suicide attempt, initial encounter: Secondary | ICD-10-CM | POA: Diagnosis not present

## 2017-08-30 HISTORY — DX: Pre-excitation syndrome: I45.6

## 2017-08-30 LAB — COMPREHENSIVE METABOLIC PANEL
ALBUMIN: 3.8 g/dL (ref 3.5–5.0)
ALT: 22 U/L (ref 0–44)
AST: 51 U/L — AB (ref 15–41)
Alkaline Phosphatase: 27 U/L — ABNORMAL LOW (ref 38–126)
Anion gap: 8 (ref 5–15)
BUN: 11 mg/dL (ref 8–23)
CALCIUM: 9 mg/dL (ref 8.9–10.3)
CHLORIDE: 101 mmol/L (ref 98–111)
CO2: 29 mmol/L (ref 22–32)
Creatinine, Ser: 0.84 mg/dL (ref 0.44–1.00)
GFR calc Af Amer: >60 mL/min (ref >60)
GLUCOSE: 82 mg/dL (ref 70–99)
POTASSIUM: 4.1 mmol/L (ref 3.5–5.1)
SODIUM: 138 mmol/L (ref 135–145)
TOTAL PROTEIN: 7.2 g/dL (ref 6.5–8.1)
Total Bilirubin: 1.2 mg/dL (ref 0.3–1.2)

## 2017-08-30 LAB — URINALYSIS, ROUTINE W REFLEX MICROSCOPIC
Bilirubin Urine: NEGATIVE
GLUCOSE, UA: NEGATIVE mg/dL
Hgb urine dipstick: NEGATIVE
Ketones, ur: NEGATIVE mg/dL
Leukocytes, UA: NEGATIVE
NITRITE: NEGATIVE
PROTEIN: NEGATIVE mg/dL
Specific Gravity, Urine: 1.005 (ref 1.005–1.030)
pH: 6 (ref 5.0–8.0)

## 2017-08-30 LAB — CBC
HCT: 36 % (ref 36.0–46.0)
Hemoglobin: 11.1 g/dL — ABNORMAL LOW (ref 12.0–15.0)
MCH: 27.7 pg (ref 26.0–34.0)
MCHC: 30.8 g/dL (ref 30.0–36.0)
MCV: 89.8 fL (ref 78.0–100.0)
PLATELETS: 166 10*3/uL (ref 150–400)
RBC: 4.01 MIL/uL (ref 3.87–5.11)
RDW: 15.4 % (ref 11.5–15.5)
WBC: 5.4 10*3/uL (ref 4.0–10.5)

## 2017-08-30 LAB — BLOOD GAS, VENOUS
ACID-BASE EXCESS: 4.1 mmol/L — AB (ref 0.0–2.0)
Bicarbonate: 26.4 mmol/L (ref 20.0–28.0)
Drawn by: 105551
FIO2: 21
O2 SAT: 55 %
pCO2, Ven: 58.8 mmHg (ref 44.0–60.0)
pH, Ven: 7.324 (ref 7.250–7.430)
pO2, Ven: 34.7 mmHg (ref 32.0–45.0)

## 2017-08-30 LAB — RAPID URINE DRUG SCREEN, HOSP PERFORMED
Amphetamines: POSITIVE — AB
BARBITURATES: NOT DETECTED
Benzodiazepines: POSITIVE — AB
COCAINE: NOT DETECTED
OPIATES: NOT DETECTED
Tetrahydrocannabinol: NOT DETECTED

## 2017-08-30 LAB — ETHANOL

## 2017-08-30 LAB — PROTIME-INR: Prothrombin Time: >90 seconds — ABNORMAL HIGH (ref 11.4–15.2)

## 2017-08-30 MED ORDER — VITAMIN K1 10 MG/ML IJ SOLN
5.0000 mg | Freq: Once | INTRAMUSCULAR | Status: AC
  Administered 2017-08-31: 5 mg via SUBCUTANEOUS
  Filled 2017-08-30: qty 1

## 2017-08-30 MED ORDER — SODIUM CHLORIDE 0.9 % IV SOLN
10.0000 mL/h | Freq: Once | INTRAVENOUS | Status: AC
  Administered 2017-08-31: 10 mL/h via INTRAVENOUS

## 2017-08-30 MED ORDER — PHYTONADIONE 5 MG PO TABS: 2.5000 mg | ORAL_TABLET | Freq: Once | ORAL | Status: DC

## 2017-08-30 NOTE — ED Notes (Signed)
Date and time results received: 08/30/17 2332   Test: PT Critical Value: >90  Name of Provider Notified: Rancour, MD

## 2017-08-30 NOTE — ED Provider Notes (Signed)
[facility] EMERGENCY DEPARTMENT Provider Note   CSN: [id number] Arrival date & time: 08/30/17  2200     History   Chief Complaint Chief Complaint  Patient presents with  . Altered Mental Status    HPI Ana Martin is a 62 y.o. female.  HPI   Ana Martin is a 62 y.o. female with past medical history of atrial fibrillation, thyroid cancer, WPW, mechanical heart valve, hypertension and anxiety who presents to the Emergency Department by EMS for fall earlier today.  Patient states that she fell forward into her closet earlier today and complains of pain and bruising to her left arm, neck pain , upper chest wall and right groin pain.  Per EMS, they were contacted by family members who states that she took an unknown amount of Xanax today.  Upon arrival,  EMS states patient was arousable.  Patient currently takes Plavix and Coumadin due to a history of atrial fibrillation. Pt does have an ICD.      Past Medical History:  Diagnosis Date  . Anxiety   . Atrial fibrillation ([place])   . Automatic implantable cardiac defibrillator in situ    [place] DR-T 09-20-2007 Dr.Akbary  . Depression   . Hyperlipidemia   . 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 Hypertension   . Lump or mass in breast    at the 7 o'clock position of right breast(non-tender)  . Other abnormal glucose   . Other left bundle branch block   . Other primary cardiomyopathies   . Thyroid cancer ([place])   . Thyroid disease   . Unspecified vitamin D deficiency   . Wolff-Parkinson-White syndrome     Patient Active Problem List   Diagnosis Date Noted  . Anxiety   . Depression   . Hypertension   . Hyperlipidemia   . Thyroid disease   . Other abnormal glucose   . Automatic implantable cardiac defibrillator in situ   . Atrial fibrillation ([place])   . Other left bundle branch block   . Other primary cardiomyopathies   . Hypersomnia, unspecified   . Lump or mass in breast   .  Unspecified vitamin D deficiency   . Thyroid cancer [facility])     Past Surgical History:  Procedure Laterality Date  . ABDOMINAL HYSTERECTOMY    . CARDIAC VALVE REPLACEMENT     st jude silzone mitray mechanical [id number] ss# [id number]  . COLONOSCOPY     April 12, 2008  . PACEMAKER PLACEMENT       OB History   None      Home Medications    Prior to Admission medications   Medication Sig Start Date End Date Taking? Authorizing Provider  ALPRAZolam Duanne Moron) 1 MG tablet Take 1 mg by mouth daily.    [provider]  amiodarone (PACERONE) 200 MG tablet Take 200 mg by mouth daily.    [provider]  carvedilol (COREG) 25 MG tablet Take 1/2 tablet q am and one tablet q pm    [provider]  CLOPIDOGREL BISULFATE PO Take 75 mg by mouth daily.    [provider]  fenofibrate 160 MG tablet Take 160 mg by mouth daily.    [provider]  FLUOXETINE HCL PO Take 40 mg by mouth daily.    [provider]  furosemide (LASIX) 40 MG tablet Take 40 mg by mouth daily as needed.    [provider]  LEVOTHYROXINE SODIUM PO  Take 200 mcg by mouth daily.    [provider]  lisinopril (PRINIVIL,ZESTRIL) 40 MG tablet Take 40 mg by mouth daily.    [provider]  oxyCODONE-acetaminophen (PERCOCET) 7.5-325 MG per tablet Take 1 tablet by mouth every 6 (six) hours as needed.    [provider]  Potassium Chloride Crys CR (POTASSIUM CHLORIDE CRYS ER PO) Take 20 mcg by mouth daily as needed.    [provider]  simvastatin (ZOCOR) 40 MG tablet Take 40 mg by mouth every evening.    [provider]  warfarin (COUMADIN) 2 MG tablet 1 tablet daily except 1/2 tablet on Sunday & Wednesday    [provider]    Family History Family History  Problem Relation Age of Onset  . Lung cancer Father        expired 47  . Sarcoidosis Mother        espired 2013    Social History Social History   Tobacco  Use  . Smoking status: Former Smoker    Packs/day: 1.00    Years: 25.00    Pack years: 25.00    Types: Cigarettes    Last attempt to quit: 10/17/2009    Years since quitting: 7.8  Substance Use Topics  . Alcohol use: No  . Drug use: No     Allergies   Codeine   Review of Systems Review of Systems  Constitutional: Negative for chills and fever.  Eyes: Negative for visual disturbance.  Respiratory: Negative for chest tightness and shortness of breath.   Cardiovascular: Positive for chest pain. Negative for leg swelling.       Bruising to bilateral upper chest wall  Gastrointestinal: Negative for abdominal pain, nausea and vomiting.  Musculoskeletal: Positive for arthralgias and neck pain. Negative for back pain and joint swelling.  Skin: Positive for wound. Negative for color change.       Skin tear left forearm  Neurological: Negative for dizziness and headaches.  All other systems reviewed and are negative.    Physical Exam Updated Vital Signs BP 122/65 (BP Location: Left Arm)   Pulse (!) 50   Temp (!) 97.3 F (36.3 C) (Oral)   Resp 18   Wt 90.7 kg (200 lb)   SpO2 97%   BMI 30.41 kg/m   Physical Exam  Constitutional: She appears well-developed. No distress.  HENT:  Head: Atraumatic.  Mouth/Throat: Oropharynx is clear and moist.  Eyes: Pupils are equal, round, and reactive to light. Conjunctivae and EOM are normal.  Neck:  C-collar applied by EMS  Cardiovascular: Normal rate and intact distal pulses.  Paced rhythm with mechanical heart sounds.  Pulmonary/Chest: Effort normal and breath sounds normal. No respiratory distress. She exhibits tenderness (ttp and marked ecchymosis of the entire upper chest wall.  No hematoma or crepitus).  Abdominal: Soft. She exhibits no distension and no mass. There is no tenderness. There is no guarding.  Musculoskeletal: She exhibits tenderness. She exhibits no edema.  ttp of midline lumbar spine, ecchymosis present.  No bony  deformity.  Tenderness of the right groin, hip flexors and extensors intact.  Neg SLR bilaterally.    Neurological: She is alert. She has normal strength. No sensory deficit. GCS eye subscore is 4. GCS verbal subscore is 5. GCS motor subscore is 6.  Speech slurred and slow.  Follows commands well.  No pronator drift.  Oriented to person, place, but not time.  CN III-XII grossly intact  Skin: Skin is warm. Capillary  refill takes less than 2 seconds.  Skin tear of the mid left forearm.  Bleeding controlled.  No hematoma  Nursing note and vitals reviewed.    ED Treatments / Results  Labs (all labs ordered are listed, but only abnormal results are displayed) Labs Reviewed  RAPID URINE DRUG SCREEN, HOSP PERFORMED - Abnormal; Notable for the following components:      Result Value   Benzodiazepines POSITIVE (*)    Amphetamines POSITIVE (*)    All other components within normal limits  PROTIME-INR - Abnormal; Notable for the following components:   Prothrombin Time >90.0 (*)    INR >10.00 (*)    All other components within normal limits  CBC - Abnormal; Notable for the following components:   Hemoglobin 11.1 (*)    All other components within normal limits  COMPREHENSIVE METABOLIC PANEL - Abnormal; Notable for the following components:   AST 51 (*)    Alkaline Phosphatase 27 (*)    All other components within normal limits  BLOOD GAS, VENOUS - Abnormal; Notable for the following components:   Acid-Base Excess 4.1 (*)    All other components within normal limits  URINALYSIS, ROUTINE W REFLEX MICROSCOPIC  ETHANOL  PREPARE FRESH FROZEN PLASMA  TYPE AND SCREEN    EKG EKG Interpretation  Date/Time:  Sunday August 30 2017 22:12:55 EDT Ventricular Rate:  70 PR Interval:    QRS Duration: 149 QT Interval:  499 QTC Calculation: 539 R Axis:   -149 Text Interpretation:  Atrial-ventricular dual-paced rhythm No further analysis attempted due to paced rhythm Baseline wander in lead(s) V6  Confirmed by Pattricia Boss [phone]) on 08/30/2017 10:17:40 PM   Radiology Ct Abdomen Pelvis Wo Contrast  Result Date: 08/31/2017 CLINICAL DATA:  Found down, bruising to chest EXAM: CT CHEST, ABDOMEN AND PELVIS WITHOUT CONTRAST TECHNIQUE: Multidetector CT imaging of the chest, abdomen and pelvis was performed following the standard protocol without IV contrast. COMPARISON:  None. FINDINGS: CT CHEST FINDINGS Cardiovascular: Cardiomegaly.  No pericardial effusion. Prosthetic mitral valve.  Left chest ICD. No evidence of thoracic aortic aneurysm. Atherosclerotic calcifications of the aortic arch. Three vessel coronary atherosclerosis. Mediastinum/Nodes: No suspicious mediastinal or axillary lymphadenopathy. Thyroid is absent. Lungs/Pleura: Evaluation of the lung parenchyma is constrained by respiratory motion. Numerous pulmonary nodules bilaterally, most of which reflect calcified granulomata, benign. However, a dominant 7 mm noncalcified nodule is present in the medial left upper lobe (series 4/image 40). Additional 4 mm noncalcified nodule in the posterior left upper lobe (series 4/image 42). 8 x 5 mm nodule in the superior segment left lower lobe (series 4/image 61). Faint ground-glass centrilobular nodularity in the bilateral upper lobes. No focal consolidation. Trace right pleural effusion. No pneumothorax. Musculoskeletal: No fracture is seen.  Median sternotomy. CT ABDOMEN PELVIS FINDINGS Motion degraded images. Hepatobiliary: Unenhanced liver is grossly unremarkable. Status post cholecystectomy. No intrahepatic or extrahepatic ductal dilatation. Pancreas: Within normal limits. Spleen: Within normal limits. Adrenals/Urinary Tract: Adrenal glands are within normal limits. Kidneys are within normal limits. No renal, ureteral, or bladder calculi. No hydronephrosis. Bladder is within normal limits. Stomach/Bowel: Stomach is within normal limits. No evidence of bowel obstruction. Normal appendix (series 3/image  102). Mild sigmoid diverticulosis, without evidence of diverticulitis. Vascular/Lymphatic: No evidence of abdominal aortic aneurysm. Atherosclerotic calcifications of the abdominal aorta and branch vessels. No suspicious abdominopelvic lymphadenopathy. Reproductive: Status post hysterectomy. No adnexal masses. Other: No abdominopelvic ascites. No hemoperitoneum or free air. Musculoskeletal: Mild superior endplate changes at L1. No fracture is seen.  IMPRESSION: Motion degraded images. No evidence of traumatic injury to the abdomen/pelvis. Numerous bilateral pulmonary nodules, some of which reflect benign calcified granulomata, although dominant noncalcified nodules in the left lung measure up to 7 mm, poorly evaluated on the current motion-degraded study. Non-contrast chest CT at 3-6 months is recommended. If the nodules are stable at time of repeat CT, then future CT at 18-24 months (from today's scan) is considered optional for low-risk patients, but is recommended for high-risk patients. This recommendation follows the consensus statement: Guidelines for Management of Incidental Pulmonary Nodules Detected on CT Images: From the Fleischner Society 2017; Radiology 2017; 284:228-243. Electronically Signed   By: Julian Hy M.D.   On: 08/31/2017 00:23   Ct Head Wo Contrast  Result Date: 08/31/2017 CLINICAL DATA:  Found down EXAM: CT HEAD WITHOUT CONTRAST CT CERVICAL SPINE WITHOUT CONTRAST TECHNIQUE: Multidetector CT imaging of the head and cervical spine was performed following the standard protocol without intravenous contrast. Multiplanar CT image reconstructions of the cervical spine were also generated. COMPARISON:  None. FINDINGS: CT HEAD FINDINGS Brain: No evidence of acute infarction, hemorrhage, hydrocephalus, extra-axial collection or mass lesion/mass effect. Mild cortical atrophy. Vascular: Intracranial atherosclerosis. Skull: Normal. Negative for fracture or focal lesion. Sinuses/Orbits: The  visualized paranasal sinuses are essentially clear. The mastoid air cells are unopacified. Other: None. CT CERVICAL SPINE FINDINGS Alignment: Normal cervical lordosis. Skull base and vertebrae: No acute fracture. No primary bone lesion or focal pathologic process. Soft tissues and spinal canal: No prevertebral fluid or swelling. No visible canal hematoma. Disc levels: Vertebral body heights and intervertebral disc spaces are maintained. Spinal canal is patent. Upper chest: Visualized lung apices are clear. Other: Thyroid is absent. IMPRESSION: No evidence of acute intracranial abnormality. No evidence of traumatic injury to the cervical spine. Electronically Signed   By: Julian Hy M.D.   On: 08/31/2017 00:13   Ct Chest Wo Contrast  Result Date: 08/31/2017 CLINICAL DATA:  Found down, bruising to chest EXAM: CT CHEST, ABDOMEN AND PELVIS WITHOUT CONTRAST TECHNIQUE: Multidetector CT imaging of the chest, abdomen and pelvis was performed following the standard protocol without IV contrast. COMPARISON:  None. FINDINGS: CT CHEST FINDINGS Cardiovascular: Cardiomegaly.  No pericardial effusion. Prosthetic mitral valve.  Left chest ICD. No evidence of thoracic aortic aneurysm. Atherosclerotic calcifications of the aortic arch. Three vessel coronary atherosclerosis. Mediastinum/Nodes: No suspicious mediastinal or axillary lymphadenopathy. Thyroid is absent. Lungs/Pleura: Evaluation of the lung parenchyma is constrained by respiratory motion. Numerous pulmonary nodules bilaterally, most of which reflect calcified granulomata, benign. However, a dominant 7 mm noncalcified nodule is present in the medial left upper lobe (series 4/image 40). Additional 4 mm noncalcified nodule in the posterior left upper lobe (series 4/image 42). 8 x 5 mm nodule in the superior segment left lower lobe (series 4/image 61). Faint ground-glass centrilobular nodularity in the bilateral upper lobes. No focal consolidation. Trace right  pleural effusion. No pneumothorax. Musculoskeletal: No fracture is seen.  Median sternotomy. CT ABDOMEN PELVIS FINDINGS Motion degraded images. Hepatobiliary: Unenhanced liver is grossly unremarkable. Status post cholecystectomy. No intrahepatic or extrahepatic ductal dilatation. Pancreas: Within normal limits. Spleen: Within normal limits. Adrenals/Urinary Tract: Adrenal glands are within normal limits. Kidneys are within normal limits. No renal, ureteral, or bladder calculi. No hydronephrosis. Bladder is within normal limits. Stomach/Bowel: Stomach is within normal limits. No evidence of bowel obstruction. Normal appendix (series 3/image 102). Mild sigmoid diverticulosis, without evidence of diverticulitis. Vascular/Lymphatic: No evidence of abdominal aortic aneurysm. Atherosclerotic calcifications of the abdominal aorta  and branch vessels. No suspicious abdominopelvic lymphadenopathy. Reproductive: Status post hysterectomy. No adnexal masses. Other: No abdominopelvic ascites. No hemoperitoneum or free air. Musculoskeletal: Mild superior endplate changes at L1. No fracture is seen. IMPRESSION: Motion degraded images. No evidence of traumatic injury to the abdomen/pelvis. Numerous bilateral pulmonary nodules, some of which reflect benign calcified granulomata, although dominant noncalcified nodules in the left lung measure up to 7 mm, poorly evaluated on the current motion-degraded study. Non-contrast chest CT at 3-6 months is recommended. If the nodules are stable at time of repeat CT, then future CT at 18-24 months (from today's scan) is considered optional for low-risk patients, but is recommended for high-risk patients. This recommendation follows the consensus statement: Guidelines for Management of Incidental Pulmonary Nodules Detected on CT Images: From the Fleischner Society 2017; Radiology 2017; 284:228-243. Electronically Signed   By: Julian Hy M.D.   On: 08/31/2017 00:23   Ct Cervical Spine Wo  Contrast  Result Date: 08/31/2017 CLINICAL DATA:  Found down EXAM: CT HEAD WITHOUT CONTRAST CT CERVICAL SPINE WITHOUT CONTRAST TECHNIQUE: Multidetector CT imaging of the head and cervical spine was performed following the standard protocol without intravenous contrast. Multiplanar CT image reconstructions of the cervical spine were also generated. COMPARISON:  None. FINDINGS: CT HEAD FINDINGS Brain: No evidence of acute infarction, hemorrhage, hydrocephalus, extra-axial collection or mass lesion/mass effect. Mild cortical atrophy. Vascular: Intracranial atherosclerosis. Skull: Normal. Negative for fracture or focal lesion. Sinuses/Orbits: The visualized paranasal sinuses are essentially clear. The mastoid air cells are unopacified. Other: None. CT CERVICAL SPINE FINDINGS Alignment: Normal cervical lordosis. Skull base and vertebrae: No acute fracture. No primary bone lesion or focal pathologic process. Soft tissues and spinal canal: No prevertebral fluid or swelling. No visible canal hematoma. Disc levels: Vertebral body heights and intervertebral disc spaces are maintained. Spinal canal is patent. Upper chest: Visualized lung apices are clear. Other: Thyroid is absent. IMPRESSION: No evidence of acute intracranial abnormality. No evidence of traumatic injury to the cervical spine. Electronically Signed   By: Julian Hy M.D.   On: 08/31/2017 00:13   Dg Chest Portable 1 View  Result Date: 08/31/2017 CLINICAL DATA:  Found down EXAM: PORTABLE CHEST 1 VIEW COMPARISON:  CT chest dated 08/30/2016 FINDINGS: Increased interstitial markings. No frank interstitial edema. Known bilateral pulmonary nodules (most of which reflect calcified granulomata) are better evaluated on recent CT. No pleural effusion or pneumothorax. Cardiomegaly.  Prosthetic mitral valve.  Left chest ICD. Median sternotomy. IMPRESSION: No evidence of acute cardiopulmonary disease. Known bilateral pulmonary nodules are better visualized on  recent CT. Electronically Signed   By: Julian Hy M.D.   On: 08/31/2017 00:58   Dg Shoulder Left Portable  Result Date: 08/31/2017 CLINICAL DATA:  Fall EXAM: LEFT SHOULDER - 1 VIEW COMPARISON:  05/09/2015 FINDINGS: Old compression plate and screw fixation of a proximal humeral fracture. No evidence of hardware complication. No evidence of acute fracture or dislocation. Mild degenerative changes of the acromioclavicular joint. Visualized soft tissues are within normal limits. IMPRESSION: Status post ORIF of a left proximal humerus fracture, without evidence of complication. Electronically Signed   By: Julian Hy M.D.   On: 08/31/2017 00:59    Procedures Procedures (including critical care time)  LACERATION REPAIR Performed by: Ahmari Duerson Authorized by: Riely Baskett Consent: Verbal consent obtained. Risks and benefits: risks, benefits and alternatives were discussed Consent given by: patient Patient identity confirmed: provided demographic data Prepped and Draped in normal sterile fashion Wound explored  Laceration Location: left  forearm  Laceration Length: 5 cm  No Foreign Bodies seen or palpated  Anesthesia: none  Irrigation method: syringe Amount of cleaning: standard  Skin closure: steri-strips  Technique: topical application  Patient tolerance: Patient tolerated the procedure well with no immediate complications.   Medications Ordered in ED Medications  0.9 %  sodium chloride infusion (10 mL/hr Intravenous New Bag/Given 08/31/17 0025)  phytonadione (VITAMIN K) SQ injection 5 mg (5 mg Subcutaneous Given 08/31/17 0025)     Initial Impression / Assessment and Plan / ED Course  I have reviewed the triage vital signs and the nursing notes.  Pertinent labs & imaging results that were available during my care of the patient were reviewed by me and considered in my medical decision making (see chart for details).      Pt seen with Dr. Jeanell Sparrow.   EMS  contacted by family member, pt with unwitnessed fall and altered level of consciousness.  Pt admits to taking Xanax today.  Alert to person and place here, answers questions appropriately.  Will obtain labs and CT of head, C-spine, chest, abdomen and pelvis.   CRITICAL CARE Performed by: Penny Arrambide Total critical care time: 30 minutes Critical care time was exclusive of separately billable procedures and treating other patients. Critical care was necessary to treat or prevent imminent or life-threatening deterioration. Critical care was time spent personally by me on the following activities: development of treatment plan with patient and/or surrogate as well as nursing, discussions with consultants, evaluation of patient's response to treatment, examination of patient, obtaining history from patient or surrogate, ordering and performing treatments and interventions, ordering and review of laboratory studies, ordering and review of radiographic studies, pulse oximetry and re-evaluation of patient's condition.   CT scans reviewed, no fx's.  INR > 10 .  FFP and vit K ordered.  Will consult Dr. Darrick Meigs for admit.   Pt remains alert, continues to answer questions appropriately, vitals stable.    0110  Consulted Dr. Darrick Meigs who agrees to see pt in ED and admit.    Final Clinical Impressions(s) / ED Diagnoses   Final diagnoses:  Fall, initial encounter  Hematoma of chest wall, unspecified laterality, initial encounter  Coagulopathy ([place])  Benzodiazepine overdose of undetermined intent, initial encounter    ED Discharge Orders    None       Kem Parkinson, PA-C 08/31/17 0140    Pattricia Boss, MD 08/31/17 1325

## 2017-08-30 NOTE — ED Triage Notes (Signed)
Per EMS pt was found in the floor with an unknown down time, bruising to chest and arms. Altered per EMS, unknown amount of Xanax taken. Pt is able to state name, location, and DOB correctly. Thinks it is 08/29/17 and Monday. EDP at bedside.

## 2017-08-30 NOTE — ED Notes (Signed)
Per Pts family member present in the room, the Pts daughter and grandson recently moved in with her and have caused the Pt to be under extreme stress lately. The Pt's family member admitted the daughter and grandson have a prescription for Adderall. Also, Pt admitted to family member she took (X4) Xanax today.

## 2017-08-30 NOTE — ED Notes (Signed)
Patient transported to CT 

## 2017-08-30 NOTE — ED Notes (Signed)
Date and time results received: 08/30/17 2333   Test: INR Critical Value: >10  Name of Provider Notified: Rancour, MD

## 2017-08-31 ENCOUNTER — Emergency Department (HOSPITAL_COMMUNITY): Payer: PPO

## 2017-08-31 ENCOUNTER — Inpatient Hospital Stay (HOSPITAL_COMMUNITY): Payer: PPO

## 2017-08-31 ENCOUNTER — Encounter (HOSPITAL_COMMUNITY): Payer: Self-pay | Admitting: Family Medicine

## 2017-08-31 ENCOUNTER — Inpatient Hospital Stay (HOSPITAL_COMMUNITY)
Admit: 2017-08-31 | Discharge: 2017-08-31 | Disposition: A | Discharge status: Home / Self Care | Payer: PPO | Attending: Family Medicine | Admitting: Family Medicine

## 2017-08-31 DIAGNOSIS — I361 Nonrheumatic tricuspid (valve) insufficiency: Secondary | ICD-10-CM

## 2017-08-31 DIAGNOSIS — T424X2A Poisoning by benzodiazepines, intentional self-harm, initial encounter: Secondary | ICD-10-CM | POA: Diagnosis present

## 2017-08-31 DIAGNOSIS — Y92009 Unspecified place in unspecified non-institutional (private) residence as the place of occurrence of the external cause: Secondary | ICD-10-CM | POA: Diagnosis not present

## 2017-08-31 DIAGNOSIS — I429 Cardiomyopathy, unspecified: Secondary | ICD-10-CM | POA: Diagnosis present

## 2017-08-31 DIAGNOSIS — I456 Pre-excitation syndrome: Secondary | ICD-10-CM | POA: Diagnosis present

## 2017-08-31 DIAGNOSIS — T45511A Poisoning by anticoagulants, accidental (unintentional), initial encounter: Secondary | ICD-10-CM | POA: Diagnosis present

## 2017-08-31 DIAGNOSIS — R791 Abnormal coagulation profile: Secondary | ICD-10-CM | POA: Diagnosis present

## 2017-08-31 DIAGNOSIS — I447 Left bundle-branch block, unspecified: Secondary | ICD-10-CM | POA: Diagnosis present

## 2017-08-31 DIAGNOSIS — F329 Major depressive disorder, single episode, unspecified: Secondary | ICD-10-CM | POA: Diagnosis present

## 2017-08-31 DIAGNOSIS — W1830XA Fall on same level, unspecified, initial encounter: Secondary | ICD-10-CM | POA: Diagnosis present

## 2017-08-31 DIAGNOSIS — I4891 Unspecified atrial fibrillation: Secondary | ICD-10-CM | POA: Diagnosis present

## 2017-08-31 DIAGNOSIS — E559 Vitamin D deficiency, unspecified: Secondary | ICD-10-CM | POA: Diagnosis present

## 2017-08-31 DIAGNOSIS — Z90710 Acquired absence of both cervix and uterus: Secondary | ICD-10-CM | POA: Diagnosis not present

## 2017-08-31 DIAGNOSIS — D689 Coagulation defect, unspecified: Secondary | ICD-10-CM

## 2017-08-31 DIAGNOSIS — Z23 Encounter for immunization: Secondary | ICD-10-CM | POA: Diagnosis not present

## 2017-08-31 DIAGNOSIS — E785 Hyperlipidemia, unspecified: Secondary | ICD-10-CM | POA: Diagnosis present

## 2017-08-31 DIAGNOSIS — E039 Hypothyroidism, unspecified: Secondary | ICD-10-CM | POA: Diagnosis present

## 2017-08-31 DIAGNOSIS — Z952 Presence of prosthetic heart valve: Secondary | ICD-10-CM | POA: Diagnosis not present

## 2017-08-31 DIAGNOSIS — S40022A Contusion of left upper arm, initial encounter: Secondary | ICD-10-CM | POA: Diagnosis present

## 2017-08-31 DIAGNOSIS — R0781 Pleurodynia: Secondary | ICD-10-CM | POA: Diagnosis present

## 2017-08-31 DIAGNOSIS — Z87891 Personal history of nicotine dependence: Secondary | ICD-10-CM

## 2017-08-31 DIAGNOSIS — R4182 Altered mental status, unspecified: Secondary | ICD-10-CM | POA: Diagnosis present

## 2017-08-31 DIAGNOSIS — W19XXXA Unspecified fall, initial encounter: Secondary | ICD-10-CM

## 2017-08-31 DIAGNOSIS — I10 Essential (primary) hypertension: Secondary | ICD-10-CM | POA: Diagnosis present

## 2017-08-31 DIAGNOSIS — T45515A Adverse effect of anticoagulants, initial encounter: Secondary | ICD-10-CM | POA: Diagnosis present

## 2017-08-31 DIAGNOSIS — J9601 Acute respiratory failure with hypoxia: Secondary | ICD-10-CM | POA: Diagnosis present

## 2017-08-31 DIAGNOSIS — F419 Anxiety disorder, unspecified: Secondary | ICD-10-CM | POA: Diagnosis present

## 2017-08-31 DIAGNOSIS — R55 Syncope and collapse: Secondary | ICD-10-CM | POA: Diagnosis present

## 2017-08-31 DIAGNOSIS — J449 Chronic obstructive pulmonary disease, unspecified: Secondary | ICD-10-CM | POA: Diagnosis present

## 2017-08-31 DIAGNOSIS — G4710 Hypersomnia, unspecified: Secondary | ICD-10-CM | POA: Diagnosis present

## 2017-08-31 DIAGNOSIS — Z95810 Presence of automatic (implantable) cardiac defibrillator: Secondary | ICD-10-CM | POA: Diagnosis not present

## 2017-08-31 DIAGNOSIS — S20219A Contusion of unspecified front wall of thorax, initial encounter: Secondary | ICD-10-CM | POA: Diagnosis present

## 2017-08-31 DIAGNOSIS — J441 Chronic obstructive pulmonary disease with (acute) exacerbation: Secondary | ICD-10-CM | POA: Diagnosis present

## 2017-08-31 DIAGNOSIS — G92 Toxic encephalopathy: Secondary | ICD-10-CM | POA: Diagnosis present

## 2017-08-31 LAB — COMPREHENSIVE METABOLIC PANEL
ALK PHOS: 29 U/L — AB (ref 38–126)
ALT: 19 U/L (ref 0–44)
ANION GAP: 6 (ref 5–15)
AST: 42 U/L — ABNORMAL HIGH (ref 15–41)
Albumin: 3.7 g/dL (ref 3.5–5.0)
BUN: 10 mg/dL (ref 8–23)
CALCIUM: 8.8 mg/dL — AB (ref 8.9–10.3)
CO2: 30 mmol/L (ref 22–32)
CREATININE: 0.79 mg/dL (ref 0.44–1.00)
Chloride: 102 mmol/L (ref 98–111)
Glucose, Bld: 80 mg/dL (ref 70–99)
Potassium: 3.7 mmol/L (ref 3.5–5.1)
SODIUM: 138 mmol/L (ref 135–145)
Total Bilirubin: 1.1 mg/dL (ref 0.3–1.2)
Total Protein: 6.8 g/dL (ref 6.5–8.1)

## 2017-08-31 LAB — CBC
HCT: 32.9 % — ABNORMAL LOW (ref 36.0–46.0)
HEMOGLOBIN: 10 g/dL — AB (ref 12.0–15.0)
MCH: 27.3 pg (ref 26.0–34.0)
MCHC: 30.4 g/dL (ref 30.0–36.0)
MCV: 89.9 fL (ref 78.0–100.0)
PLATELETS: 155 10*3/uL (ref 150–400)
RBC: 3.66 MIL/uL — ABNORMAL LOW (ref 3.87–5.11)
RDW: 15.4 % (ref 11.5–15.5)
WBC: 5.3 10*3/uL (ref 4.0–10.5)

## 2017-08-31 LAB — PROTIME-INR
INR: 3.82
PROTHROMBIN TIME: 37.3 s — AB (ref 11.4–15.2)

## 2017-08-31 LAB — TYPE AND SCREEN
ABO/RH(D): O NEG
ANTIBODY SCREEN: NEGATIVE

## 2017-08-31 LAB — ECHOCARDIOGRAM COMPLETE
HEIGHTINCHES: 67 in
WEIGHTICAEL: 3264 [oz_av]

## 2017-08-31 MED ORDER — SODIUM CHLORIDE 0.9% FLUSH: 3.0000 mL | INTRAVENOUS | Status: DC | PRN

## 2017-08-31 MED ORDER — ALPRAZOLAM 1 MG PO TABS: 1.0000 mg | ORAL_TABLET | Freq: Every day | ORAL | Status: DC

## 2017-08-31 MED ORDER — FUROSEMIDE 40 MG PO TABS
40.0000 mg | ORAL_TABLET | Freq: Every day | ORAL | Status: DC
  Administered 2017-09-01 – 2017-09-04 (×4): 40 mg via ORAL
  Filled 2017-08-31 (×4): qty 1

## 2017-08-31 MED ORDER — LISINOPRIL 10 MG PO TABS
20.0000 mg | ORAL_TABLET | Freq: Every day | ORAL | Status: DC
  Administered 2017-09-01 – 2017-09-04 (×4): 20 mg via ORAL
  Filled 2017-08-31 (×4): qty 2

## 2017-08-31 MED ORDER — CARVEDILOL 12.5 MG PO TABS
12.5000 mg | ORAL_TABLET | Freq: Every day | ORAL | Status: DC
  Administered 2017-08-31 – 2017-09-04 (×5): 12.5 mg via ORAL
  Filled 2017-08-31 (×5): qty 1

## 2017-08-31 MED ORDER — AMIODARONE HCL 200 MG PO TABS
200.0000 mg | ORAL_TABLET | Freq: Every day | ORAL | Status: DC
  Administered 2017-08-31 – 2017-09-04 (×5): 200 mg via ORAL
  Filled 2017-08-31 (×5): qty 1

## 2017-08-31 MED ORDER — OXYCODONE HCL 5 MG PO TABS
5.0000 mg | ORAL_TABLET | ORAL | Status: DC | PRN
  Administered 2017-09-04 (×4): 5 mg via ORAL
  Filled 2017-08-31 (×5): qty 1

## 2017-08-31 MED ORDER — ALPRAZOLAM 0.5 MG PO TABS
0.5000 mg | ORAL_TABLET | Freq: Every day | ORAL | Status: DC
  Administered 2017-08-31 – 2017-09-02 (×3): 0.5 mg via ORAL
  Filled 2017-08-31 (×4): qty 1

## 2017-08-31 MED ORDER — LEVOTHYROXINE SODIUM 100 MCG PO TABS
200.0000 ug | ORAL_TABLET | Freq: Every day | ORAL | Status: DC
  Administered 2017-08-31 – 2017-09-04 (×5): 200 ug via ORAL
  Filled 2017-08-31 (×5): qty 2

## 2017-08-31 MED ORDER — SODIUM CHLORIDE 0.9% FLUSH
3.0000 mL | Freq: Two times a day (BID) | INTRAVENOUS | Status: DC
  Administered 2017-08-31 – 2017-09-04 (×10): 3 mL via INTRAVENOUS

## 2017-08-31 MED ORDER — ONDANSETRON HCL 4 MG PO TABS: 4.0000 mg | ORAL_TABLET | Freq: Four times a day (QID) | ORAL | Status: DC | PRN

## 2017-08-31 MED ORDER — ACETAMINOPHEN 650 MG RE SUPP: 650.0000 mg | Freq: Four times a day (QID) | RECTAL | Status: DC | PRN

## 2017-08-31 MED ORDER — POTASSIUM CHLORIDE CRYS ER 20 MEQ PO TBCR
20.0000 meq | EXTENDED_RELEASE_TABLET | Freq: Every day | ORAL | Status: DC
  Administered 2017-09-01 – 2017-09-04 (×4): 20 meq via ORAL
  Filled 2017-08-31 (×4): qty 1

## 2017-08-31 MED ORDER — CLOPIDOGREL BISULFATE 75 MG PO TABS
75.0000 mg | ORAL_TABLET | Freq: Every day | ORAL | Status: DC
  Administered 2017-08-31 – 2017-09-04 (×5): 75 mg via ORAL
  Filled 2017-08-31 (×5): qty 1

## 2017-08-31 MED ORDER — ACETAMINOPHEN 325 MG PO TABS
650.0000 mg | ORAL_TABLET | Freq: Four times a day (QID) | ORAL | Status: DC | PRN
  Administered 2017-08-31 – 2017-09-03 (×4): 650 mg via ORAL
  Filled 2017-08-31 (×4): qty 2

## 2017-08-31 MED ORDER — IPRATROPIUM-ALBUTEROL 0.5-2.5 (3) MG/3ML IN SOLN
3.0000 mL | Freq: Four times a day (QID) | RESPIRATORY_TRACT | Status: DC
  Administered 2017-08-31 – 2017-09-02 (×11): 3 mL via RESPIRATORY_TRACT
  Filled 2017-08-31 (×11): qty 3

## 2017-08-31 MED ORDER — ACETAMINOPHEN 325 MG PO TABS
650.0000 mg | ORAL_TABLET | Freq: Once | ORAL | Status: AC
  Administered 2017-08-31: 650 mg via ORAL
  Filled 2017-08-31: qty 2

## 2017-08-31 MED ORDER — MOMETASONE FURO-FORMOTEROL FUM 200-5 MCG/ACT IN AERO
INHALATION_SPRAY | RESPIRATORY_TRACT | Status: AC
  Filled 2017-08-31: qty 8.8

## 2017-08-31 MED ORDER — CARVEDILOL 12.5 MG PO TABS
25.0000 mg | ORAL_TABLET | Freq: Every day | ORAL | Status: DC
  Administered 2017-08-31 – 2017-09-04 (×5): 25 mg via ORAL
  Filled 2017-08-31 (×6): qty 2

## 2017-08-31 MED ORDER — FENOFIBRATE 160 MG PO TABS
160.0000 mg | ORAL_TABLET | Freq: Every day | ORAL | Status: DC
  Administered 2017-08-31 – 2017-09-04 (×5): 160 mg via ORAL
  Filled 2017-08-31 (×5): qty 1

## 2017-08-31 MED ORDER — SIMVASTATIN 20 MG PO TABS
40.0000 mg | ORAL_TABLET | Freq: Every evening | ORAL | Status: DC
  Administered 2017-08-31 – 2017-09-01 (×2): 40 mg via ORAL
  Filled 2017-08-31 (×2): qty 2

## 2017-08-31 MED ORDER — FLUOXETINE HCL 20 MG PO CAPS
60.0000 mg | ORAL_CAPSULE | Freq: Every day | ORAL | Status: DC
  Administered 2017-08-31 – 2017-09-03 (×4): 60 mg via ORAL
  Filled 2017-08-31 (×4): qty 3

## 2017-08-31 MED ORDER — ONDANSETRON HCL 4 MG/2ML IJ SOLN
4.0000 mg | Freq: Four times a day (QID) | INTRAMUSCULAR | Status: DC | PRN
  Administered 2017-09-04: 4 mg via INTRAVENOUS
  Filled 2017-08-31: qty 2

## 2017-08-31 MED ORDER — MOMETASONE FURO-FORMOTEROL FUM 200-5 MCG/ACT IN AERO
2.0000 | INHALATION_SPRAY | Freq: Two times a day (BID) | RESPIRATORY_TRACT | Status: DC
  Administered 2017-09-01 – 2017-09-04 (×7): 2 via RESPIRATORY_TRACT
  Filled 2017-08-31: qty 8.8

## 2017-08-31 MED ORDER — SODIUM CHLORIDE 0.9 % IV SOLN: 250.0000 mL | INTRAVENOUS | Status: DC | PRN

## 2017-08-31 NOTE — ED Provider Notes (Signed)
Patient presents after fall and syncope.  She has large hematoma to her chest.  She admits to taking about 4 Xanax at home today.  ABCs are intact.  GCS is 14.  Obtunded but arousable, protecting airway. PPM in place. Breath sounds diminished bilaterally. Extensive bruising to chest wall and breasts No C spine tenderness.  Traumatic imaging is negative.  Patient does have coagulopathy with INR greater than 10.  Vitamin K and FFP ordered.  Patient does have mechanical heart valve.  She is hemodynamic Lee stable.  There is no indication for emergent reversal with [facility].  We will hold Coumadin and give FFP and vitamin K to reverse INR slowly.  CRITICAL CARE Performed by: Ezequiel Essex Total critical care time: 35 minutes Critical care time was exclusive of separately billable procedures and treating other patients. Critical care was necessary to treat or prevent imminent or life-threatening deterioration. Critical care was time spent personally by me on the following activities: development of treatment plan with patient and/or surrogate as well as nursing, discussions with consultants, evaluation of patient's response to treatment, examination of patient, obtaining history from patient or surrogate, ordering and performing treatments and interventions, ordering and review of laboratory studies, ordering and review of radiographic studies, pulse oximetry and re-evaluation of patient's condition.    Ezequiel Essex, MD 08/31/17 [phone]

## 2017-08-31 NOTE — Progress Notes (Signed)
*  PRELIMINARY RESULTS* Echocardiogram 2D Echocardiogram has been performed.  Ana Martin 08/31/2017, 12:59 PM

## 2017-08-31 NOTE — Plan of Care (Signed)
  Problem: Acute Rehab PT Goals(only PT should resolve) Goal: Pt Will Go Supine/Side To Sit Outcome: Progressing Flowsheets (Taken 08/31/2017 1230) Pt will go Supine/Side to Sit: with min guard assist Goal: Patient Will Transfer Sit To/From Stand Outcome: Progressing Flowsheets (Taken 08/31/2017 1230) Patient will transfer sit to/from stand: with min guard assist Goal: Pt Will Transfer Bed To Chair/Chair To Bed Outcome: Progressing Flowsheets (Taken 08/31/2017 1230) Pt will Transfer Bed to Chair/Chair to Bed: min guard assist Goal: Pt Will Ambulate Outcome: Progressing Flowsheets (Taken 08/31/2017 1230) Pt will Ambulate: 25 feet;with minimal assist;with rolling walker   12:31 PM, 08/31/17 [name], MPT Physical Therapist with [facility] [phone] office [phone] mobile phone

## 2017-08-31 NOTE — Procedures (Signed)
ELECTROENCEPHALOGRAM REPORT   Patient: Ana Martin       Room #: A328 EEG No. ID: [id number] Age: 62 y.o.        Sex: female Referring Physician: Wynetta Emery Report Date:  08/31/2017        Interpreting Physician: Alexis Goodell  History: Ana Martin is an 62 y.o. female with a history of seizure admitted after a fall with question of syncope  Medications:  Xanax, Pacerone, Coreg, Plavix, Fenofibrate, Synthroid  Conditions of Recording:  This is a 21 channel routine scalp EEG performed with bipolar and monopolar montages arranged in accordance to the international 10/20 system of electrode placement. One channel was dedicated to EKG recording.  The patient is in the drowsy and asleep states.  Description:  Despite attempts to alert the patient, she did not achieve full wakefulness during the recording therefore a waking background activity could not be evaluated.   With drowse the background is slow and poorly organized.  It consists of irregular, low voltage theta and beta activity.   The patient goes in to a light sleep with symmetrical sleep spindles, vertex central sharp transients and irregular slow activity.  No epileptiform activity is noted.   Hyperventilation and intermittent photic stimulation were not performed.   IMPRESSION: This is a normal drowsy and asleep electroencephalogram.  No epileptiform activity is noted.     Alexis Goodell, MD Neurology [phone] 08/31/2017, 3:32 PM

## 2017-08-31 NOTE — Evaluation (Addendum)
Physical Therapy Evaluation Patient Details Name: Ana Martin MRN: [id number] DOB: [birth year] Today's Date: 08/31/2017   History of Present Illness  Ana Martin  is a 62 y.o. female, with history of atrial fibrillation, WPW syndrome, AICD, hypertension on chronic anticoagulation with Coumadin, mechanical heart valve, hypertension, thyroid cancer, anxiety who came to ED after patient fell at home.  Patient says that she fell into her closet and complains of pain in the arm chest groin.  She denies passing out but does not remember falling.  Patient says that she woke up on the floor.    Clinical Impression  Patient demonstrates slow labored movement for sitting up, requires frequent verbal/tactile cueing for safety due to lethargy, limited to a few steps to transfer to [facility] to urinate, unable to ambulate beyond bedside due to weakness, poor standing balance and lethargy.  Patient put back to bed.  Patient will benefit from continued physical therapy in hospital and recommended venue below to increase strength, balance, endurance for safe ADLs and gait.    Follow Up Recommendations SNF;Supervision/Assistance - 24 hour;Supervision for mobility/OOB    Equipment Recommendations  Rolling walker with 5" wheels    Recommendations for Other Services       Precautions / Restrictions Precautions Precautions: Fall Restrictions Weight Bearing Restrictions: No      Mobility  Bed Mobility Overal bed mobility: Needs Assistance Bed Mobility: Supine to Sit     Supine to sit: Min assist;Mod assist Sit to supine: Min assist      Transfers Overall transfer level: Needs assistance Equipment used: Rolling walker (2 wheeled) Transfers: Sit to/from [facility] Sit to Stand: Min assist;Mod assist Stand pivot transfers: Mod assist          Ambulation/Gait Ambulation/Gait assistance: Mod assist;Max assist Gait Distance (Feet): 3 Feet Assistive device: Rolling walker  (2 wheeled) Gait Pattern/deviations: Decreased step length - right;Decreased step length - left;Decreased stride length Gait velocity: slow   General Gait Details: limited to 3-4 unsteady labored steps at bedside due to weakness and lethargy  Stairs            Wheelchair Mobility    Modified Rankin (Stroke Patients Only)       Balance Overall balance assessment: Needs assistance Sitting-balance support: Feet supported;No upper extremity supported Sitting balance-Leahy Scale: Fair     Standing balance support: No upper extremity supported;During functional activity Standing balance-Leahy Scale: Poor Standing balance comment: fair/poor with RW                             Pertinent Vitals/Pain Pain Assessment: Faces Faces Pain Scale: Hurts little more Pain Location: all over body, mostly left shoulder Pain Descriptors / Indicators: Aching;Sore;Discomfort Pain Intervention(s): Limited activity within patient's tolerance;Monitored during session    Home Living Family/patient expects to be discharged to:: Private residence Living Arrangements: Children Available Help at Discharge: Family Type of Home: Apartment Home Access: Level entry     Home Layout: Multi-level Home Equipment: Cane - single point;Bedside commode      Prior Function Level of Independence: Independent               Hand Dominance        Extremity/Trunk Assessment   Upper Extremity Assessment Upper Extremity Assessment: Generalized weakness    Lower Extremity Assessment Lower Extremity Assessment: Generalized weakness    Cervical / Trunk Assessment Cervical / Trunk Assessment: Normal  Communication   Communication: No  difficulties  Cognition Arousal/Alertness: Awake/alert;Lethargic Behavior During Therapy: WFL for tasks assessed/performed Overall Cognitive Status: No family/caregiver present to determine baseline cognitive functioning                                  General Comments: slightly lethargic      General Comments      Exercises     Assessment/Plan    PT Assessment Patient needs continued PT services  PT Problem List Decreased strength;Decreased activity tolerance;Decreased balance;Decreased mobility       PT Treatment Interventions Gait training;Stair training;Functional mobility training;Therapeutic activities;Therapeutic exercise;Patient/family education    PT Goals (Current goals can be found in the Care Plan section)  Acute Rehab PT Goals Patient Stated Goal: return home PT Goal Formulation: With patient Time For Goal Achievement: 09/17/17 Potential to Achieve Goals: Good    Frequency Min 3X/week   Barriers to discharge        Co-evaluation               AM-PAC PT "6 Clicks" Daily Activity  Outcome Measure Difficulty turning over in bed (including adjusting bedclothes, sheets and blankets)?: A Little Difficulty moving from lying on back to sitting on the side of the bed? : A Lot Difficulty sitting down on and standing up from a chair with arms (e.g., wheelchair, bedside commode, etc,.)?: A Lot Help needed moving to and from a bed to chair (including a wheelchair)?: A Lot Help needed walking in hospital room?: A Lot Help needed climbing 3-5 steps with a railing? : A Lot 6 Click Score: 13    End of Session Equipment Utilized During Treatment: Oxygen Activity Tolerance: Patient limited by fatigue;Patient limited by lethargy;Patient limited by pain Patient left: in bed;with call bell/phone within reach;with bed alarm set Nurse Communication: Mobility status PT Visit Diagnosis: Unsteadiness on feet (R26.81);Other abnormalities of gait and mobility (R26.89);Muscle weakness (generalized) (M62.81)    Time: 0930-1004 PT Time Calculation (min) (ACUTE ONLY): 34 min   Charges:   PT Evaluation $PT Eval Moderate Complexity: 1 Mod PT Treatments $Therapeutic Activity: 23-37 mins        12:29  PM, 08/31/17 [name], MPT Physical Therapist with [facility] 336 [phone] office [phone] mobile phone

## 2017-08-31 NOTE — Progress Notes (Signed)
Patient unable to answer admission questions will have day shift follow up with patient's family in am.

## 2017-08-31 NOTE — H&P (Addendum)
TRH H&P    Patient Demographics:    Ana Martin, is a 62 y.o. female  MRN: [id number]  DOB - Oct 29, 1955  Admit Date - 08/30/2017  Referring MD/NP/PA: Dr. Wyvonnia Dusky  Outpatient Primary MD for the patient is Kristopher Glee., MD  Patient coming from: Home  Chief complaint-    HPI:    Ana Martin  is a 62 y.o. female, with history of atrial fibrillation, WPW syndrome, AICD, hypertension on chronic anticoagulation with Coumadin, mechanical heart valve, hypertension, thyroid cancer, anxiety who came to ED after patient fell at home.  Patient says that she fell into her closet and complains of pain in the arm chest groin.  She denies passing out but does not remember falling.  Patient says that she woke up on the floor. She denies nausea vomiting or diarrhea. Denies shortness of breath.  In the ED, uri drug screen was positive for amphetamines and benzodiazepines.  Patient says that she does not take amphetamines and her son is on Adderall.  INR was elevated greater than 10.  2 units of FFP and vitamin K 5 mg IV x1 were ordered.  She denies dysuria Denies abdominal pain.   Review of systems:      All other systems reviewed and are negative.   With Past History of the following :    Past Medical History:  Diagnosis Date  . Anxiety   . Atrial fibrillation ([place])   . Automatic implantable cardiac defibrillator in situ    Lumax DR-T 09-20-2007 Dr.Akbary  . Depression   . Hyperlipidemia   . 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 Hypertension   . Lump or mass in breast    at the 7 o'clock position of right breast(non-tender)  . Other abnormal glucose   . Other left bundle branch block   . Other primary cardiomyopathies   . Thyroid cancer ([place])   . Thyroid disease   . Unspecified vitamin D deficiency   . Wolff-Parkinson-White syndrome       Past Surgical History:    Procedure Laterality Date  . ABDOMINAL HYSTERECTOMY    . CARDIAC VALVE REPLACEMENT     st jude silzone mitray mechanical [id number] ss# [id number]  . COLONOSCOPY     April 12, 2008  . PACEMAKER PLACEMENT        Social History:      Social History   Tobacco Use  . Smoking status: Former Smoker    Packs/day: 1.00    Years: 25.00    Pack years: 25.00    Types: Cigarettes    Last attempt to quit: 10/17/2009    Years since quitting: 7.8  Substance Use Topics  . Alcohol use: No       Family History :     Family History  Problem Relation Age of Onset  . Lung cancer Father        expired 2  . Sarcoidosis Mother        espired 2013  Home Medications:   Prior to Admission medications   Medication Sig Start Date End Date Taking? Authorizing Provider  ALPRAZolam Duanne Moron) 1 MG tablet Take 1 mg by mouth daily.    [provider]  amiodarone (PACERONE) 200 MG tablet Take 200 mg by mouth daily.    [provider]  carvedilol (COREG) 25 MG tablet Take 1/2 tablet q am and one tablet q pm    [provider]  CLOPIDOGREL BISULFATE PO Take 75 mg by mouth daily.    [provider]  fenofibrate 160 MG tablet Take 160 mg by mouth daily.    [provider]  FLUOXETINE HCL PO Take 40 mg by mouth daily.    [provider]  furosemide (LASIX) 40 MG tablet Take 40 mg by mouth daily as needed.    [provider]  LEVOTHYROXINE SODIUM PO Take 200 mcg by mouth daily.    [provider]  lisinopril (PRINIVIL,ZESTRIL) 40 MG tablet Take 40 mg by mouth daily.    [provider]  oxyCODONE-acetaminophen (PERCOCET) 7.5-325 MG per tablet Take 1 tablet by mouth every 6 (six) hours as needed.    [provider]  Potassium Chloride Crys CR (POTASSIUM CHLORIDE CRYS ER PO) Take 20 mcg by mouth daily as needed.    [provider]  simvastatin (ZOCOR) 40 MG tablet Take 40 mg by mouth every evening.     [provider]  warfarin (COUMADIN) 2 MG tablet 1 tablet daily except 1/2 tablet on Sunday & Wednesday    [provider]     Allergies:     Allergies  Allergen Reactions  . Codeine      Physical Exam:   Vitals  Blood pressure 126/61, pulse 70, temperature 97.6 F (36.4 C), temperature source Oral, resp. rate 14, weight 90.7 kg (200 lb), SpO2 100 %.  1.  General: Appears in no acute distress  2. Psychiatric:  Intact judgement and  insight, awake alert, oriented x 3.  3. Neurologic: No focal neurological deficits, all cranial nerves intact.Strength 5/5 all 4 extremities, sensation intact all 4 extremities, plantars down going.  4. Eyes :  anicteric sclerae, moist conjunctivae with no lid lag. PERRLA.  5. ENMT:  Oropharynx clear with moist mucous membranes and good dentition  6. Neck:  supple, no cervical lymphadenopathy appriciated, No thyromegaly  7. Respiratory : Normal respiratory effort, good air movement bilaterally,clear to  auscultation bilaterally  8. Cardiovascular : RRR, no gallops, rubs or murmurs, no leg edema  9. Gastrointestinal:  Positive bowel sounds, abdomen soft, non-tender to palpation,no hepatosplenomegaly, no rigidity or guarding       10 . Skin:  Multiple areas of ecchymosis noted on the chest wall  11.Musculoskeletal:  Good muscle tone,  joints appear normal , no effusions,  normal range of motion    Data Review:    CBC Recent Labs  Lab 08/30/17 2245  WBC 5.4  HGB 11.1*  HCT 36.0  PLT 166  MCV 89.8  MCH 27.7  MCHC 30.8  RDW 15.4   ------------------------------------------------------------------------------------------------------------------  Chemistries  Recent Labs  Lab 08/30/17 2245  NA 138  K 4.1  CL 101  CO2 29  GLUCOSE 82  BUN 11  CREATININE 0.84  CALCIUM 9.0  AST 51*  ALT 22  ALKPHOS 27*  BILITOT 1.2    ------------------------------------------------------------------------------------------------------------------  ------------------------------------------------------------------------------------------------------------------ GFR: CrCl cannot be calculated (Unknown ideal weight.). Liver Function Tests: Recent Labs  Lab 08/30/17 2245  AST 51*  ALT 22  ALKPHOS 27*  BILITOT 1.2  PROT 7.2  ALBUMIN 3.8   No results for input(s): LIPASE, AMYLASE in the last 168 hours. No results for input(s): AMMONIA in the last 168 hours. Coagulation Profile: Recent Labs  Lab 08/30/17 2245  INR >10.00*    --------------------------------------------------------------------------------------------------------------- Urine analysis:    Component Value Date/Time   COLORURINE YELLOW 08/30/2017 2245   APPEARANCEUR CLEAR 08/30/2017 2245   LABSPEC 1.005 08/30/2017 2245   PHURINE 6.0 08/30/2017 2245   GLUCOSEU NEGATIVE 08/30/2017 2245   HGBUR NEGATIVE 08/30/2017 2245   BILIRUBINUR NEGATIVE 08/30/2017 2245   KETONESUR NEGATIVE 08/30/2017 2245   PROTEINUR NEGATIVE 08/30/2017 2245   NITRITE NEGATIVE 08/30/2017 2245   LEUKOCYTESUR NEGATIVE 08/30/2017 2245      Imaging Results:    Ct Abdomen Pelvis Wo Contrast  Result Date: 08/31/2017 CLINICAL DATA:  Found down, bruising to chest EXAM: CT CHEST, ABDOMEN AND PELVIS WITHOUT CONTRAST TECHNIQUE: Multidetector CT imaging of the chest, abdomen and pelvis was performed following the standard protocol without IV contrast. COMPARISON:  None. FINDINGS: CT CHEST FINDINGS Cardiovascular: Cardiomegaly.  No pericardial effusion. Prosthetic mitral valve.  Left chest ICD. No evidence of thoracic aortic aneurysm. Atherosclerotic calcifications of the aortic arch. Three vessel coronary atherosclerosis. Mediastinum/Nodes: No suspicious mediastinal or axillary lymphadenopathy. Thyroid is absent. Lungs/Pleura: Evaluation of the lung parenchyma is constrained by  respiratory motion. Numerous pulmonary nodules bilaterally, most of which reflect calcified granulomata, benign. However, a dominant 7 mm noncalcified nodule is present in the medial left upper lobe (series 4/image 40). Additional 4 mm noncalcified nodule in the posterior left upper lobe (series 4/image 42). 8 x 5 mm nodule in the superior segment left lower lobe (series 4/image 61). Faint ground-glass centrilobular nodularity in the bilateral upper lobes. No focal consolidation. Trace right pleural effusion. No pneumothorax. Musculoskeletal: No fracture is seen.  Median sternotomy. CT ABDOMEN PELVIS FINDINGS Motion degraded images. Hepatobiliary: Unenhanced liver is grossly unremarkable. Status post cholecystectomy. No intrahepatic or extrahepatic ductal dilatation. Pancreas: Within normal limits. Spleen: Within normal limits. Adrenals/Urinary Tract: Adrenal glands are within normal limits. Kidneys are within normal limits. No renal, ureteral, or bladder calculi. No hydronephrosis. Bladder is within normal limits. Stomach/Bowel: Stomach is within normal limits. No evidence of bowel obstruction. Normal appendix (series 3/image 102). Mild sigmoid diverticulosis, without evidence of diverticulitis. Vascular/Lymphatic: No evidence of abdominal aortic aneurysm. Atherosclerotic calcifications of the abdominal aorta and branch vessels. No suspicious abdominopelvic lymphadenopathy. Reproductive: Status post hysterectomy. No adnexal masses. Other: No abdominopelvic ascites. No hemoperitoneum or free air. Musculoskeletal: Mild superior endplate changes at L1. No fracture is seen. IMPRESSION: Motion degraded images. No evidence of traumatic injury to the abdomen/pelvis. Numerous bilateral pulmonary nodules, some of which reflect benign calcified granulomata, although dominant noncalcified nodules in the left lung measure up to 7 mm, poorly evaluated on the current motion-degraded study. Non-contrast chest CT at 3-6 months is  recommended. If the nodules are stable at time of repeat CT, then future CT at 18-24 months (from today's scan) is considered optional for low-risk patients, but is recommended for high-risk patients. This recommendation follows the consensus statement: Guidelines for Management of Incidental Pulmonary Nodules Detected on CT Images: From the Fleischner Society 2017; Radiology 2017; 284:228-243. Electronically Signed   By: Julian Hy M.D.   On: 08/31/2017 00:23   Ct Head Wo Contrast  Result Date: 08/31/2017 CLINICAL DATA:  Found down EXAM: CT HEAD WITHOUT CONTRAST CT CERVICAL SPINE WITHOUT CONTRAST TECHNIQUE: Multidetector CT imaging of  the head and cervical spine was performed following the standard protocol without intravenous contrast. Multiplanar CT image reconstructions of the cervical spine were also generated. COMPARISON:  None. FINDINGS: CT HEAD FINDINGS Brain: No evidence of acute infarction, hemorrhage, hydrocephalus, extra-axial collection or mass lesion/mass effect. Mild cortical atrophy. Vascular: Intracranial atherosclerosis. Skull: Normal. Negative for fracture or focal lesion. Sinuses/Orbits: The visualized paranasal sinuses are essentially clear. The mastoid air cells are unopacified. Other: None. CT CERVICAL SPINE FINDINGS Alignment: Normal cervical lordosis. Skull base and vertebrae: No acute fracture. No primary bone lesion or focal pathologic process. Soft tissues and spinal canal: No prevertebral fluid or swelling. No visible canal hematoma. Disc levels: Vertebral body heights and intervertebral disc spaces are maintained. Spinal canal is patent. Upper chest: Visualized lung apices are clear. Other: Thyroid is absent. IMPRESSION: No evidence of acute intracranial abnormality. No evidence of traumatic injury to the cervical spine. Electronically Signed   By: Julian Hy M.D.   On: 08/31/2017 00:13   Ct Chest Wo Contrast  Result Date: 08/31/2017 CLINICAL DATA:  Found down,  bruising to chest EXAM: CT CHEST, ABDOMEN AND PELVIS WITHOUT CONTRAST TECHNIQUE: Multidetector CT imaging of the chest, abdomen and pelvis was performed following the standard protocol without IV contrast. COMPARISON:  None. FINDINGS: CT CHEST FINDINGS Cardiovascular: Cardiomegaly.  No pericardial effusion. Prosthetic mitral valve.  Left chest ICD. No evidence of thoracic aortic aneurysm. Atherosclerotic calcifications of the aortic arch. Three vessel coronary atherosclerosis. Mediastinum/Nodes: No suspicious mediastinal or axillary lymphadenopathy. Thyroid is absent. Lungs/Pleura: Evaluation of the lung parenchyma is constrained by respiratory motion. Numerous pulmonary nodules bilaterally, most of which reflect calcified granulomata, benign. However, a dominant 7 mm noncalcified nodule is present in the medial left upper lobe (series 4/image 40). Additional 4 mm noncalcified nodule in the posterior left upper lobe (series 4/image 42). 8 x 5 mm nodule in the superior segment left lower lobe (series 4/image 61). Faint ground-glass centrilobular nodularity in the bilateral upper lobes. No focal consolidation. Trace right pleural effusion. No pneumothorax. Musculoskeletal: No fracture is seen.  Median sternotomy. CT ABDOMEN PELVIS FINDINGS Motion degraded images. Hepatobiliary: Unenhanced liver is grossly unremarkable. Status post cholecystectomy. No intrahepatic or extrahepatic ductal dilatation. Pancreas: Within normal limits. Spleen: Within normal limits. Adrenals/Urinary Tract: Adrenal glands are within normal limits. Kidneys are within normal limits. No renal, ureteral, or bladder calculi. No hydronephrosis. Bladder is within normal limits. Stomach/Bowel: Stomach is within normal limits. No evidence of bowel obstruction. Normal appendix (series 3/image 102). Mild sigmoid diverticulosis, without evidence of diverticulitis. Vascular/Lymphatic: No evidence of abdominal aortic aneurysm. Atherosclerotic calcifications  of the abdominal aorta and branch vessels. No suspicious abdominopelvic lymphadenopathy. Reproductive: Status post hysterectomy. No adnexal masses. Other: No abdominopelvic ascites. No hemoperitoneum or free air. Musculoskeletal: Mild superior endplate changes at L1. No fracture is seen. IMPRESSION: Motion degraded images. No evidence of traumatic injury to the abdomen/pelvis. Numerous bilateral pulmonary nodules, some of which reflect benign calcified granulomata, although dominant noncalcified nodules in the left lung measure up to 7 mm, poorly evaluated on the current motion-degraded study. Non-contrast chest CT at 3-6 months is recommended. If the nodules are stable at time of repeat CT, then future CT at 18-24 months (from today's scan) is considered optional for low-risk patients, but is recommended for high-risk patients. This recommendation follows the consensus statement: Guidelines for Management of Incidental Pulmonary Nodules Detected on CT Images: From the Fleischner Society 2017; Radiology 2017; 284:228-243. Electronically Signed   By: Henderson Newcomer.D.  On: 08/31/2017 00:23   Ct Cervical Spine Wo Contrast  Result Date: 08/31/2017 CLINICAL DATA:  Found down EXAM: CT HEAD WITHOUT CONTRAST CT CERVICAL SPINE WITHOUT CONTRAST TECHNIQUE: Multidetector CT imaging of the head and cervical spine was performed following the standard protocol without intravenous contrast. Multiplanar CT image reconstructions of the cervical spine were also generated. COMPARISON:  None. FINDINGS: CT HEAD FINDINGS Brain: No evidence of acute infarction, hemorrhage, hydrocephalus, extra-axial collection or mass lesion/mass effect. Mild cortical atrophy. Vascular: Intracranial atherosclerosis. Skull: Normal. Negative for fracture or focal lesion. Sinuses/Orbits: The visualized paranasal sinuses are essentially clear. The mastoid air cells are unopacified. Other: None. CT CERVICAL SPINE FINDINGS Alignment: Normal cervical  lordosis. Skull base and vertebrae: No acute fracture. No primary bone lesion or focal pathologic process. Soft tissues and spinal canal: No prevertebral fluid or swelling. No visible canal hematoma. Disc levels: Vertebral body heights and intervertebral disc spaces are maintained. Spinal canal is patent. Upper chest: Visualized lung apices are clear. Other: Thyroid is absent. IMPRESSION: No evidence of acute intracranial abnormality. No evidence of traumatic injury to the cervical spine. Electronically Signed   By: Julian Hy M.D.   On: 08/31/2017 00:13   Dg Chest Portable 1 View  Result Date: 08/31/2017 CLINICAL DATA:  Found down EXAM: PORTABLE CHEST 1 VIEW COMPARISON:  CT chest dated 08/30/2016 FINDINGS: Increased interstitial markings. No frank interstitial edema. Known bilateral pulmonary nodules (most of which reflect calcified granulomata) are better evaluated on recent CT. No pleural effusion or pneumothorax. Cardiomegaly.  Prosthetic mitral valve.  Left chest ICD. Median sternotomy. IMPRESSION: No evidence of acute cardiopulmonary disease. Known bilateral pulmonary nodules are better visualized on recent CT. Electronically Signed   By: Julian Hy M.D.   On: 08/31/2017 00:58   Dg Shoulder Left Portable  Result Date: 08/31/2017 CLINICAL DATA:  Fall EXAM: LEFT SHOULDER - 1 VIEW COMPARISON:  05/09/2015 FINDINGS: Old compression plate and screw fixation of a proximal humeral fracture. No evidence of hardware complication. No evidence of acute fracture or dislocation. Mild degenerative changes of the acromioclavicular joint. Visualized soft tissues are within normal limits. IMPRESSION: Status post ORIF of a left proximal humerus fracture, without evidence of complication. Electronically Signed   By: Julian Hy M.D.   On: 08/31/2017 00:59    My personal review of EKG: Paced rhythm   Assessment & Plan:    Active Problems:   Elevated INR   1. Coagulopathy/elevated INR-patient  takes Coumadin, INR significantly elevated.  Will hold Coumadin.  Patient received 2 units of FFP in the ED, vitamin K 5 mg IV x1.  Will recheck INR in a.m. 2. Somnolence-patient was very somnolent in the ED, urine toxin was positive for amphetamines and benzodiazepines.  Patient says that she does not take amphetamines her son is on Adderall.  Will monitor closely. 3. ?  Syncope-will monitor patient on telemetry, EKG shows paced rhythm.  Will obtain echocardiogram in a.m. 4. Hypertension-continue Coreg, will hold lisinopril at this time 5. Hypothyroidism-continue Synthroid 6. Mechanical heart valve-patient has St. Jude's mechanical heart valve, Coumadin is on hold.  Continue Plavix. 7. Bilateral pulmonary nodules-seen on CT chest, will need outpatient CT chest in 3 to 6 months   DVT Prophylaxis-SCDs  AM Labs Ordered, also please review Full Orders  Family Communication: Admission, patients condition and plan of care including tests being ordered have been discussed with the patient  who indicate understanding and agree with the plan and Code Status.  Code Status: Full code  Admission status: Observation  Time spent in minutes : 60 minutes   Oswald Hillock M.D on 08/31/2017 at 2:01 AM  Between 7am to 7pm - Pager - [phone]. After 7pm go to [email or web address] - password [facility]  [facility] - Office  [phone]

## 2017-08-31 NOTE — Progress Notes (Signed)
ANTICOAGULATION CONSULT NOTE - Initial Consult  Pharmacy Consult for Coumadin Indication: mechanical mitral valve  Allergies  Allergen Reactions  . Codeine     Patient Measurements: Height: 5\' 7"  (170.2 cm) Weight: 204 lb (92.5 kg) IBW/kg (Calculated) : 61.6   Vital Signs: Temp: 98 F (36.7 C) (07/29 0515) Temp Source: Oral (07/29 0515) BP: 121/67 (07/29 0515) Pulse Rate: 69 (07/29 0515)  Labs: Recent Labs    08/30/17 2245 08/31/17 0605  HGB 11.1* 10.0*  HCT 36.0 32.9*  PLT 166 155  LABPROT >90.0* 37.3*  INR >10.00* 3.82  CREATININE 0.84 0.79    Estimated Creatinine Clearance: 84.1 mL/min (by C-G formula based on SCr of 0.79 mg/dL).   Medical History: Past Medical History:  Diagnosis Date  . Anxiety   . Atrial fibrillation ([place])   . Automatic implantable cardiac defibrillator in situ    Lumax DR-T 09-20-2007 Dr.Akbary  . Depression   . Hyperlipidemia   . 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 Hypertension   . Lump or mass in breast    at the 7 o'clock position of right breast(non-tender)  . Other abnormal glucose   . Other left bundle branch block   . Other primary cardiomyopathies   . Thyroid cancer ([place])   . Thyroid disease   . Unspecified vitamin D deficiency   . Wolff-Parkinson-White syndrome     Medications:  Medications Prior to Admission  Medication Sig Dispense Refill Last Dose  . budesonide-formoterol (SYMBICORT) 160-4.5 MCG/ACT inhaler Inhale 2 puffs into the lungs 2 (two) times daily.     Marland Kitchen albuterol (PROAIR HFA) 108 (90 Base) MCG/ACT inhaler Inhale 1 puff into the lungs every 4 (four) hours as needed.     . ALPRAZolam (XANAX) 1 MG tablet Take 1 mg by mouth daily.   Taking  . amiodarone (PACERONE) 200 MG tablet Take 200 mg by mouth daily.   Taking  . carvedilol (COREG) 25 MG tablet Take 12.5-25 mg by mouth 2 (two) times daily with a meal. Take 12.5mg  in the morning and 25mg  in the evening   Taking  . clopidogrel  (PLAVIX) 75 MG tablet Take 75 mg by mouth daily.    Taking  . fenofibrate 160 MG tablet Take 160 mg by mouth daily.   Taking  . FLUoxetine (PROZAC) 20 MG capsule Take 60 mg by mouth daily.    Taking  . furosemide (LASIX) 40 MG tablet Take 40 mg by mouth daily as needed.   Taking  . levothyroxine (SYNTHROID, LEVOTHROID) 200 MCG tablet Take 200 mcg by mouth daily.    Taking  . lisinopril (PRINIVIL,ZESTRIL) 40 MG tablet Take 40 mg by mouth daily.   Taking  . oxyCODONE-acetaminophen (PERCOCET) 7.5-325 MG per tablet Take 1 tablet by mouth every 6 (six) hours as needed.   Taking  . potassium chloride SA (K-DUR,KLOR-CON) 20 MEQ tablet Take 20 mEq by mouth daily as needed.    Taking  . simvastatin (ZOCOR) 40 MG tablet Take 40 mg by mouth every evening.   Taking  . warfarin (COUMADIN) 2 MG tablet Take 2 mg by mouth as directed. 1 tablet daily except 1/2 tablet on Sunday & Wednesday    Taking    Assessment: 62 y.o. female, with history of atrial fibrillation, WPW syndrome, AICD, hypertension on chronic anticoagulation with Coumadin, mechanical heart valve, hypertension, thyroid cancer, anxiety who came to ED after patient fell at home. INR significantly elevated in ED  And  Vit. K 5mg  IV given. Patients INR is slowly coming down. Still slightly elevated at 3.8. Will still continue to see effect of vitamin K and INR will come down to therapeutic range.  Goal of Therapy:  INR 2.5-3.5 Monitor platelets by anticoagulation protocol: Yes   Plan:  No Coumadin today Daily PT-INR Monitor for S/S of bleeding  Isac Sarna, BS Vena [place], BCPS Clinical Pharmacist Pager [phone] 08/31/2017,10:24 AM

## 2017-08-31 NOTE — Progress Notes (Signed)
EEG completed, results pending. 
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
## 2017-09-01 DIAGNOSIS — R791 Abnormal coagulation profile: Secondary | ICD-10-CM

## 2017-09-01 DIAGNOSIS — T45511A Poisoning by anticoagulants, accidental (unintentional), initial encounter: Secondary | ICD-10-CM

## 2017-09-01 DIAGNOSIS — I456 Pre-excitation syndrome: Secondary | ICD-10-CM

## 2017-09-01 DIAGNOSIS — J441 Chronic obstructive pulmonary disease with (acute) exacerbation: Secondary | ICD-10-CM

## 2017-09-01 LAB — HIV ANTIBODY (ROUTINE TESTING W REFLEX): HIV SCREEN 4TH GENERATION: NONREACTIVE

## 2017-09-01 LAB — CBC
HEMATOCRIT: 32 % — AB (ref 36.0–46.0)
HEMOGLOBIN: 9.8 g/dL — AB (ref 12.0–15.0)
MCH: 27.5 pg (ref 26.0–34.0)
MCHC: 30.6 g/dL (ref 30.0–36.0)
MCV: 89.6 fL (ref 78.0–100.0)
Platelets: 160 10*3/uL (ref 150–400)
RBC: 3.57 MIL/uL — AB (ref 3.87–5.11)
RDW: 15.2 % (ref 11.5–15.5)
WBC: 6.4 10*3/uL (ref 4.0–10.5)

## 2017-09-01 LAB — COMPREHENSIVE METABOLIC PANEL
ALK PHOS: 31 U/L — AB (ref 38–126)
ALT: 17 U/L (ref 0–44)
ANION GAP: 7 (ref 5–15)
AST: 32 U/L (ref 15–41)
Albumin: 3.8 g/dL (ref 3.5–5.0)
BILIRUBIN TOTAL: 1.2 mg/dL (ref 0.3–1.2)
BUN: 13 mg/dL (ref 8–23)
CALCIUM: 8.7 mg/dL — AB (ref 8.9–10.3)
CO2: 29 mmol/L (ref 22–32)
CREATININE: 0.86 mg/dL (ref 0.44–1.00)
Chloride: 101 mmol/L (ref 98–111)
GFR calc Af Amer: >60 mL/min (ref >60)
GFR calc non Af Amer: >60 mL/min (ref >60)
Glucose, Bld: 101 mg/dL — ABNORMAL HIGH (ref 70–99)
Potassium: 4.1 mmol/L (ref 3.5–5.1)
Sodium: 137 mmol/L (ref 135–145)
TOTAL PROTEIN: 7.1 g/dL (ref 6.5–8.1)

## 2017-09-01 LAB — BPAM FFP
BLOOD PRODUCT EXPIRATION DATE: 201908032359
Blood Product Expiration Date: 201908032359
ISSUE DATE / TIME: 201907290133
ISSUE DATE / TIME: 201907290327
UNIT TYPE AND RH: 5100
Unit Type and Rh: 5100

## 2017-09-01 LAB — PROTIME-INR
INR: 2.03
Prothrombin Time: 22.8 seconds — ABNORMAL HIGH (ref 11.4–15.2)

## 2017-09-01 LAB — PREPARE FRESH FROZEN PLASMA
UNIT DIVISION: 0
Unit division: 0

## 2017-09-01 LAB — MAGNESIUM: Magnesium: 1.7 mg/dL (ref 1.7–2.4)

## 2017-09-01 MED ORDER — WARFARIN - PHARMACIST DOSING INPATIENT
Freq: Every day | Status: DC
  Administered 2017-09-02 – 2017-09-04 (×3)

## 2017-09-01 MED ORDER — ORAL CARE MOUTH RINSE
15.0000 mL | Freq: Two times a day (BID) | OROMUCOSAL | Status: DC
  Administered 2017-09-01 – 2017-09-04 (×6): 15 mL via OROMUCOSAL

## 2017-09-01 MED ORDER — GUAIFENESIN ER 600 MG PO TB12
1200.0000 mg | ORAL_TABLET | Freq: Two times a day (BID) | ORAL | Status: DC
  Administered 2017-09-01 – 2017-09-04 (×6): 1200 mg via ORAL
  Filled 2017-09-01 (×6): qty 2

## 2017-09-01 MED ORDER — WARFARIN SODIUM 2 MG PO TABS
2.0000 mg | ORAL_TABLET | Freq: Once | ORAL | Status: AC
Start: 2017-09-01 — End: 2017-09-01
  Administered 2017-09-01: 2 mg via ORAL
  Filled 2017-09-01: qty 1

## 2017-09-01 MED ORDER — METHYLPREDNISOLONE SODIUM SUCC 125 MG IJ SOLR
60.0000 mg | Freq: Two times a day (BID) | INTRAMUSCULAR | Status: DC
  Administered 2017-09-01 – 2017-09-04 (×6): 60 mg via INTRAVENOUS
  Filled 2017-09-01 (×6): qty 2

## 2017-09-01 MED ORDER — PNEUMOCOCCAL VAC POLYVALENT 25 MCG/0.5ML IJ INJ
0.5000 mL | INJECTION | INTRAMUSCULAR | Status: AC
  Administered 2017-09-02: 0.5 mL via INTRAMUSCULAR
  Filled 2017-09-01: qty 0.5

## 2017-09-01 MED ORDER — ALBUTEROL SULFATE (2.5 MG/3ML) 0.083% IN NEBU: 2.5000 mg | INHALATION_SOLUTION | RESPIRATORY_TRACT | Status: DC | PRN

## 2017-09-01 NOTE — Clinical Social Work Note (Signed)
Clinical Social Work Assessment  Patient Details  Name: Ana Martin MRN: [id number] Date of Birth: 1955/07/19  Date of referral:  09/01/17               Reason for consult:  Facility Placement, Abuse/Neglect                Permission sought to share information with:    Permission granted to share information::     Name::        Agency::     Relationship::  Sister Ana Martin [phone] was at bedside  Contact Information:     Housing/Transportation Living arrangements for the past 2 months:  Apartment Source of Information:  Patient, Siblings Patient Interpreter Needed:  None Criminal Activity/Legal Involvement Pertinent to Current Situation/Hospitalization:  No - Comment as needed Significant Relationships:  Adult Children, Significant Other, Other Family Members Lives with:  Minor Children(Grandchildren ages 52 and 71) Do you feel safe going back to the place where you live?  Yes Need for family participation in patient care:  Yes (Comment)  Care giving concerns:  Patient is fully independent at baseline.    Social Worker assessment / plan:  Patient states that she will be going to her sisters home at [address] [place], [place] for rehab. Patient stated that she took too many Xanax due to being depressed for the past month. Her depression was triggered by her daughter moving her from [facility] and creating issues for the family. She stated that she has had kinship care of her 31 year old grandson for a year now and her 62 year old grandson since the first week in July. Both children were placed with their grandmother by [facility] DSS/CPS due to biological mother's substance abuse issues.  Ana Martin stated that she attempted to have the children stay with her until patient is able to return home but they have refused and are staying with their biological mother at [place].  Patient stated that her daughter showed up at her home this weekend stating that she was  homeless and this created an even more stressful environment. Patient denied any form of physical abuse that caused the bruising. Patient denied taking amphetaphines. She states that she does not know why she would test positive for them. Ana Martin stated that patient may have accidentally taken the pills due to being under the influence of her own medications.  She stated that patient is meticulous in giving her grandson his medication daily and keeping up with the medication for him.   LCSW discussed that if the children did not go with patient and her sister, CPS would have to be contacted in regards to them being with their biological mother.   LCSW left a message requesting return contact for [facility] CPS to make a report.  LCSW notified Case management that patient would be going home with her sister for a few weeks and requested HHPT.   LCSW will follow up with CPS to make report if contact is not returned.   Employment status:  Disabled (Comment on whether or not currently receiving Disability) Insurance information:  Programmer, applications PT Recommendations:  24 Hour Supervision, [place] / Referral to community resources:  CPS (Comment Required: [place], Name & Number of worker spoken with)  Patient/Family's Response to care:  Patient plans on going home with HHP at her sister's home.   Patient/Family's Understanding of and Emotional Response to Diagnosis, Current Treatment, and Prognosis:  Patient understands her diagnosis, treatment and prognosis and plans on going home with her sister with HHPT.   Emotional Assessment Appearance:  Appears stated age Attitude/Demeanor/Rapport:    Affect (typically observed):  Accepting Orientation:  Oriented to Self, Oriented to Place, Oriented to  Time, Oriented to Situation Alcohol / Substance use:  Not Applicable Psych involvement (Current and /or in the community):  No (Comment)  Discharge Needs  Concerns to be  addressed:  Discharge Planning Concerns Readmission within the last 30 days:  No Current discharge risk:  None Barriers to Discharge:  No Barriers Identified   Ana Gully, LCSW 09/01/2017, 4:17 PM

## 2017-09-01 NOTE — ACP (Advance Care Planning) (Signed)
Aided Ana Martin with her Advance Directives. I gave her requested copies and placed a copy in her chart to be scanned in.

## 2017-09-01 NOTE — Progress Notes (Signed)
**Note De-Identified Capricia Serda Obfuscation** Inspiratory flow measured for adequate MDI medication delivery.  Test WNL

## 2017-09-01 NOTE — Progress Notes (Signed)
PROGRESS NOTE    Ana Martin  HYW:[id number] DOB: 1955-04-22 DOA: 08/30/2017 PCP: Kristopher Glee., MD    Brief Narrative:  62 year old female with a history of WPW syndrome, AICD, mechanical mitral valve, on chronic anticoagulation, presents to the emergency room after having a fall.  Patient was noted to be somnolent.  She is chronically on benzodiazepines.  Urine drug screen positive for benzodiazepines and amphetamines.  She was admitted for further management.   Assessment & Plan:   Active Problems:   Elevated INR   Syncope   Anticoagulant overdosage   Wolff-Parkinson-White syndrome   COPD (chronic obstructive pulmonary disease) (HCC)   Pleuritic chest pain   Former smoker   1. Somnolence.  Urine tox screen was positive for amphetamines and benzodiazepines.  Patient reports that she may have taken extra Xanax due to social stressors.  She should likely have behavioral health follow-up.  Denies any suicidal ideations at this time. 2. Coagulopathy/elevated INR.  Related to Coumadin.  She received vitamin K and FFP.  Coumadin is since trending down.  Pharmacy is monitoring. 3. History of mitral mechanical heart valve.  She is anticoagulated with Coumadin. 4. Hypertension.  Continue on Coreg.  Blood pressure currently stable.  Lisinopril has been held. 5. Hypothyroidism.  Continue on Synthroid 6. Acute respiratory failure.  Currently requiring 4 L of oxygen.  Initial chest x-ray/chest CT did not show any acute findings.  She does have a history of COPD.  Currently on bronchodilators.  Wean off oxygen as tolerated. 7. COPD with exacerbation.  Continue on bronchodilators.  We will add low-dose steroids.  Continue pulmonary hygiene 8. Cardiomyopathy.  Echo shows ejection fraction of 30 to 35%.  Patient reports that this may be a chronic finding.  She follows with a cardiologist in [facility].  Will request records. 9. History of atrial fibrillation.  She is anticoagulated with  Coumadin.  She is status post AICD and EKG shows atrial paced rhythm. 10. Bilateral pulmonary nodules.  Seen on chest CT.  Will need follow-up CT chest in 3 to 6 months.   DVT prophylaxis: Coumadin Code Status: Full code Family Communication: No family present Disposition Plan: She is refusing nursing facility placement and will likely return home with family with home health services   Consultants:     Procedures:  Echo:- Left ventricle: The cavity size was normal. Wall thickness was   normal. Systolic function was moderately to severely reduced. The   estimated ejection fraction was in the range of 30% to 35%.   Cannot evaluate diastolic function in setting of MVR. - Aortic valve: Valve area (VTI): 1.54 cm^2. - Mitral valve: There is a mechanical MV in the MV position,   unknown type and size. There was no evidence for stenosis. There   was no significant regurgitation. Mean gradient (D): 5 mm Hg. - Left atrium: The atrium was mildly dilated. - Right ventricle: The cavity size was mildly dilated. - Right atrium: The atrium was mildly dilated.  - Technically difficult study.  SWN:IOEV is a normal drowsy and asleep electroencephalogram.  No epileptiform activity is noted.  Antimicrobials:      Subjective: Feels more short of breath today.  Has not had any coughing, but feels she is wheezing.  Continues to have diffuse pain  Objective: Vitals:   09/01/17 0737 09/01/17 1531 09/01/17 1938 09/01/17 1939  BP:  (!) 109/58    Pulse:  70    Resp:  18    Temp:  98.4 F (36.9 C)    TempSrc:  Oral    SpO2: 98% 99% 96% 96%  Weight:      Height:        Intake/Output Summary (Last 24 hours) at 09/01/2017 2026 Last data filed at 09/01/2017 1800 Gross per 24 hour  Intake 600 ml  Output -  Net 600 ml   Filed Weights   08/30/17 2206 08/31/17 0301  Weight: 90.7 kg (200 lb) 92.5 kg (204 lb)    Examination:  General exam: Appears calm and comfortable  Respiratory system:  Diminished breath sounds, faint wheezing and upper airways. Respiratory effort normal. Cardiovascular system: S1 & S2 heard, RRR. No JVD, murmurs, rubs, gallops or clicks. No pedal edema. Gastrointestinal system: Abdomen is nondistended, soft and nontender. No organomegaly or masses felt. Normal bowel sounds heard. Central nervous system: Alert and oriented. No focal neurological deficits. Extremities: Symmetric 5 x 5 power. Skin: Bruising noted over her back, extremities, chin Psychiatry: Judgement and insight appear normal. Mood & affect appropriate.     Data Reviewed: I have personally reviewed following labs and imaging studies  CBC: Recent Labs  Lab 08/30/17 2245 08/31/17 0605 09/01/17 0608  WBC 5.4 5.3 6.4  HGB 11.1* 10.0* 9.8*  HCT 36.0 32.9* 32.0*  MCV 89.8 89.9 89.6  PLT 166 155 277   Basic Metabolic Panel: Recent Labs  Lab 08/30/17 2245 08/31/17 0605 09/01/17 0608  NA 138 138 137  K 4.1 3.7 4.1  CL 101 102 101  CO2 29 30 29   GLUCOSE 82 80 101*  BUN 11 10 13   CREATININE 0.84 0.79 0.86  CALCIUM 9.0 8.8* 8.7*  MG  --   --  1.7   GFR: Estimated Creatinine Clearance: 78.2 mL/min (by C-G formula based on SCr of 0.86 mg/dL). Liver Function Tests: Recent Labs  Lab 08/30/17 2245 08/31/17 0605 09/01/17 0608  AST 51* 42* 32  ALT 22 19 17   ALKPHOS 27* 29* 31*  BILITOT 1.2 1.1 1.2  PROT 7.2 6.8 7.1  ALBUMIN 3.8 3.7 3.8   No results for input(s): LIPASE, AMYLASE in the last 168 hours. No results for input(s): AMMONIA in the last 168 hours. Coagulation Profile: Recent Labs  Lab 08/30/17 2245 08/31/17 0605 09/01/17 0608  INR >10.00* 3.82 2.03   Cardiac Enzymes: No results for input(s): CKTOTAL, CKMB, CKMBINDEX, TROPONINI in the last 168 hours. BNP (last 3 results) No results for input(s): PROBNP in the last 8760 hours. HbA1C: No results for input(s): HGBA1C in the last 72 hours. CBG: No results for input(s): GLUCAP in the last 168 hours. Lipid  Profile: No results for input(s): CHOL, HDL, LDLCALC, TRIG, CHOLHDL, LDLDIRECT in the last 72 hours. Thyroid Function Tests: No results for input(s): TSH, T4TOTAL, FREET4, T3FREE, THYROIDAB in the last 72 hours. Anemia Panel: No results for input(s): VITAMINB12, FOLATE, FERRITIN, TIBC, IRON, RETICCTPCT in the last 72 hours. Sepsis Labs: No results for input(s): PROCALCITON, LATICACIDVEN in the last 168 hours.  No results found for this or any previous visit (from the past 240 hour(s)).       Radiology Studies: Ct Abdomen Pelvis Wo Contrast  Result Date: 08/31/2017 CLINICAL DATA:  Found down, bruising to chest EXAM: CT CHEST, ABDOMEN AND PELVIS WITHOUT CONTRAST TECHNIQUE: Multidetector CT imaging of the chest, abdomen and pelvis was performed following the standard protocol without IV contrast. COMPARISON:  None. FINDINGS: CT CHEST FINDINGS Cardiovascular: Cardiomegaly.  No pericardial effusion. Prosthetic mitral valve.  Left chest ICD. No evidence of  thoracic aortic aneurysm. Atherosclerotic calcifications of the aortic arch. Three vessel coronary atherosclerosis. Mediastinum/Nodes: No suspicious mediastinal or axillary lymphadenopathy. Thyroid is absent. Lungs/Pleura: Evaluation of the lung parenchyma is constrained by respiratory motion. Numerous pulmonary nodules bilaterally, most of which reflect calcified granulomata, benign. However, a dominant 7 mm noncalcified nodule is present in the medial left upper lobe (series 4/image 40). Additional 4 mm noncalcified nodule in the posterior left upper lobe (series 4/image 42). 8 x 5 mm nodule in the superior segment left lower lobe (series 4/image 61). Faint ground-glass centrilobular nodularity in the bilateral upper lobes. No focal consolidation. Trace right pleural effusion. No pneumothorax. Musculoskeletal: No fracture is seen.  Median sternotomy. CT ABDOMEN PELVIS FINDINGS Motion degraded images. Hepatobiliary: Unenhanced liver is grossly  unremarkable. Status post cholecystectomy. No intrahepatic or extrahepatic ductal dilatation. Pancreas: Within normal limits. Spleen: Within normal limits. Adrenals/Urinary Tract: Adrenal glands are within normal limits. Kidneys are within normal limits. No renal, ureteral, or bladder calculi. No hydronephrosis. Bladder is within normal limits. Stomach/Bowel: Stomach is within normal limits. No evidence of bowel obstruction. Normal appendix (series 3/image 102). Mild sigmoid diverticulosis, without evidence of diverticulitis. Vascular/Lymphatic: No evidence of abdominal aortic aneurysm. Atherosclerotic calcifications of the abdominal aorta and branch vessels. No suspicious abdominopelvic lymphadenopathy. Reproductive: Status post hysterectomy. No adnexal masses. Other: No abdominopelvic ascites. No hemoperitoneum or free air. Musculoskeletal: Mild superior endplate changes at L1. No fracture is seen. IMPRESSION: Motion degraded images. No evidence of traumatic injury to the abdomen/pelvis. Numerous bilateral pulmonary nodules, some of which reflect benign calcified granulomata, although dominant noncalcified nodules in the left lung measure up to 7 mm, poorly evaluated on the current motion-degraded study. Non-contrast chest CT at 3-6 months is recommended. If the nodules are stable at time of repeat CT, then future CT at 18-24 months (from today's scan) is considered optional for low-risk patients, but is recommended for high-risk patients. This recommendation follows the consensus statement: Guidelines for Management of Incidental Pulmonary Nodules Detected on CT Images: From the Fleischner Society 2017; Radiology 2017; 284:228-243. Electronically Signed   By: Julian Hy M.D.   On: 08/31/2017 00:23   Ct Head Wo Contrast  Result Date: 08/31/2017 CLINICAL DATA:  Found down EXAM: CT HEAD WITHOUT CONTRAST CT CERVICAL SPINE WITHOUT CONTRAST TECHNIQUE: Multidetector CT imaging of the head and cervical spine  was performed following the standard protocol without intravenous contrast. Multiplanar CT image reconstructions of the cervical spine were also generated. COMPARISON:  None. FINDINGS: CT HEAD FINDINGS Brain: No evidence of acute infarction, hemorrhage, hydrocephalus, extra-axial collection or mass lesion/mass effect. Mild cortical atrophy. Vascular: Intracranial atherosclerosis. Skull: Normal. Negative for fracture or focal lesion. Sinuses/Orbits: The visualized paranasal sinuses are essentially clear. The mastoid air cells are unopacified. Other: None. CT CERVICAL SPINE FINDINGS Alignment: Normal cervical lordosis. Skull base and vertebrae: No acute fracture. No primary bone lesion or focal pathologic process. Soft tissues and spinal canal: No prevertebral fluid or swelling. No visible canal hematoma. Disc levels: Vertebral body heights and intervertebral disc spaces are maintained. Spinal canal is patent. Upper chest: Visualized lung apices are clear. Other: Thyroid is absent. IMPRESSION: No evidence of acute intracranial abnormality. No evidence of traumatic injury to the cervical spine. Electronically Signed   By: Julian Hy M.D.   On: 08/31/2017 00:13   Ct Chest Wo Contrast  Result Date: 08/31/2017 CLINICAL DATA:  Found down, bruising to chest EXAM: CT CHEST, ABDOMEN AND PELVIS WITHOUT CONTRAST TECHNIQUE: Multidetector CT imaging of the chest, abdomen  and pelvis was performed following the standard protocol without IV contrast. COMPARISON:  None. FINDINGS: CT CHEST FINDINGS Cardiovascular: Cardiomegaly.  No pericardial effusion. Prosthetic mitral valve.  Left chest ICD. No evidence of thoracic aortic aneurysm. Atherosclerotic calcifications of the aortic arch. Three vessel coronary atherosclerosis. Mediastinum/Nodes: No suspicious mediastinal or axillary lymphadenopathy. Thyroid is absent. Lungs/Pleura: Evaluation of the lung parenchyma is constrained by respiratory motion. Numerous pulmonary  nodules bilaterally, most of which reflect calcified granulomata, benign. However, a dominant 7 mm noncalcified nodule is present in the medial left upper lobe (series 4/image 40). Additional 4 mm noncalcified nodule in the posterior left upper lobe (series 4/image 42). 8 x 5 mm nodule in the superior segment left lower lobe (series 4/image 61). Faint ground-glass centrilobular nodularity in the bilateral upper lobes. No focal consolidation. Trace right pleural effusion. No pneumothorax. Musculoskeletal: No fracture is seen.  Median sternotomy. CT ABDOMEN PELVIS FINDINGS Motion degraded images. Hepatobiliary: Unenhanced liver is grossly unremarkable. Status post cholecystectomy. No intrahepatic or extrahepatic ductal dilatation. Pancreas: Within normal limits. Spleen: Within normal limits. Adrenals/Urinary Tract: Adrenal glands are within normal limits. Kidneys are within normal limits. No renal, ureteral, or bladder calculi. No hydronephrosis. Bladder is within normal limits. Stomach/Bowel: Stomach is within normal limits. No evidence of bowel obstruction. Normal appendix (series 3/image 102). Mild sigmoid diverticulosis, without evidence of diverticulitis. Vascular/Lymphatic: No evidence of abdominal aortic aneurysm. Atherosclerotic calcifications of the abdominal aorta and branch vessels. No suspicious abdominopelvic lymphadenopathy. Reproductive: Status post hysterectomy. No adnexal masses. Other: No abdominopelvic ascites. No hemoperitoneum or free air. Musculoskeletal: Mild superior endplate changes at L1. No fracture is seen. IMPRESSION: Motion degraded images. No evidence of traumatic injury to the abdomen/pelvis. Numerous bilateral pulmonary nodules, some of which reflect benign calcified granulomata, although dominant noncalcified nodules in the left lung measure up to 7 mm, poorly evaluated on the current motion-degraded study. Non-contrast chest CT at 3-6 months is recommended. If the nodules are stable  at time of repeat CT, then future CT at 18-24 months (from today's scan) is considered optional for low-risk patients, but is recommended for high-risk patients. This recommendation follows the consensus statement: Guidelines for Management of Incidental Pulmonary Nodules Detected on CT Images: From the Fleischner Society 2017; Radiology 2017; 284:228-243. Electronically Signed   By: Julian Hy M.D.   On: 08/31/2017 00:23   Ct Cervical Spine Wo Contrast  Result Date: 08/31/2017 CLINICAL DATA:  Found down EXAM: CT HEAD WITHOUT CONTRAST CT CERVICAL SPINE WITHOUT CONTRAST TECHNIQUE: Multidetector CT imaging of the head and cervical spine was performed following the standard protocol without intravenous contrast. Multiplanar CT image reconstructions of the cervical spine were also generated. COMPARISON:  None. FINDINGS: CT HEAD FINDINGS Brain: No evidence of acute infarction, hemorrhage, hydrocephalus, extra-axial collection or mass lesion/mass effect. Mild cortical atrophy. Vascular: Intracranial atherosclerosis. Skull: Normal. Negative for fracture or focal lesion. Sinuses/Orbits: The visualized paranasal sinuses are essentially clear. The mastoid air cells are unopacified. Other: None. CT CERVICAL SPINE FINDINGS Alignment: Normal cervical lordosis. Skull base and vertebrae: No acute fracture. No primary bone lesion or focal pathologic process. Soft tissues and spinal canal: No prevertebral fluid or swelling. No visible canal hematoma. Disc levels: Vertebral body heights and intervertebral disc spaces are maintained. Spinal canal is patent. Upper chest: Visualized lung apices are clear. Other: Thyroid is absent. IMPRESSION: No evidence of acute intracranial abnormality. No evidence of traumatic injury to the cervical spine. Electronically Signed   By: Julian Hy M.D.   On: 08/31/2017  00:13   Dg Chest Portable 1 View  Result Date: 08/31/2017 CLINICAL DATA:  Found down EXAM: PORTABLE CHEST 1 VIEW  COMPARISON:  CT chest dated 08/30/2016 FINDINGS: Increased interstitial markings. No frank interstitial edema. Known bilateral pulmonary nodules (most of which reflect calcified granulomata) are better evaluated on recent CT. No pleural effusion or pneumothorax. Cardiomegaly.  Prosthetic mitral valve.  Left chest ICD. Median sternotomy. IMPRESSION: No evidence of acute cardiopulmonary disease. Known bilateral pulmonary nodules are better visualized on recent CT. Electronically Signed   By: Julian Hy M.D.   On: 08/31/2017 00:58   Dg Shoulder Left Portable  Result Date: 08/31/2017 CLINICAL DATA:  Fall EXAM: LEFT SHOULDER - 1 VIEW COMPARISON:  05/09/2015 FINDINGS: Old compression plate and screw fixation of a proximal humeral fracture. No evidence of hardware complication. No evidence of acute fracture or dislocation. Mild degenerative changes of the acromioclavicular joint. Visualized soft tissues are within normal limits. IMPRESSION: Status post ORIF of a left proximal humerus fracture, without evidence of complication. Electronically Signed   By: Julian Hy M.D.   On: 08/31/2017 00:59        Scheduled Meds: . ALPRAZolam  0.5 mg Oral Daily  . amiodarone  200 mg Oral Daily  . carvedilol  12.5 mg Oral Q breakfast  . carvedilol  25 mg Oral Q supper  . clopidogrel  75 mg Oral Daily  . fenofibrate  160 mg Oral Daily  . FLUoxetine  60 mg Oral QHS  . furosemide  40 mg Oral Daily  . ipratropium-albuterol  3 mL Nebulization Q6H  . levothyroxine  200 mcg Oral QAC breakfast  . lisinopril  20 mg Oral Daily  . mouth rinse  15 mL Mouth Rinse BID  . mometasone-formoterol  2 puff Inhalation BID  . [START ON 09/02/2017] pneumococcal 23 valent vaccine  0.5 mL Intramuscular Tomorrow-1000  . potassium chloride SA  20 mEq Oral Daily  . simvastatin  40 mg Oral QPM  . sodium chloride flush  3 mL Intravenous Q12H  . Warfarin - Pharmacist Dosing Inpatient   Does not apply q1800   Continuous  Infusions: . sodium chloride       LOS: 1 day    Time spent: 25 minutes    Kathie Dike, MD [facility] Pager [phone]  If 7PM-7AM, please contact night-coverage [email or web address] Password [email or web address] 09/01/2017, 8:26 PM

## 2017-09-01 NOTE — Progress Notes (Signed)
ANTICOAGULATION CONSULT NOTE - Initial Consult  Pharmacy Consult for Coumadin Indication: mechanical mitral valve  Allergies  Allergen Reactions  . Codeine     Patient Measurements: Height: 5\' 7"  (170.2 cm) Weight: 204 lb (92.5 kg) IBW/kg (Calculated) : 61.6   Vital Signs: Temp: 98.5 F (36.9 C) (07/30 0549) Temp Source: Oral (07/30 0549) BP: 150/72 (07/30 0727) Pulse Rate: 70 (07/30 0727)  Labs: Recent Labs    08/30/17 2245 08/31/17 0605 09/01/17 0608  HGB 11.1* 10.0* 9.8*  HCT 36.0 32.9* 32.0*  PLT 166 155 160  LABPROT >90.0* 37.3* 22.8*  INR >10.00* 3.82 2.03  CREATININE 0.84 0.79 0.86    Estimated Creatinine Clearance: 78.2 mL/min (by C-G formula based on SCr of 0.86 mg/dL).   Medical History: Past Medical History:  Diagnosis Date  . Anxiety   . Atrial fibrillation ([place])   . Automatic implantable cardiac defibrillator in situ    [place]um[place] DR-T 09-20-2007 Dr.Akbary  . Depression   . Hyperlipidemia   . 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 Hypertension   . Lump or mass in breast    at the 7 o'clock position of right breast(non-tender)  . Other abnormal glucose   . Other left bundle branch block   . Other primary cardiomyopathies   . Thyroid cancer ([place])   . Thyroid disease   . Unspecified vitamin D deficiency   . Wolff-Parkinson-White syndrome     Medications:  Medications Prior to Admission  Medication Sig Dispense Refill Last Dose  . albuterol (PROAIR HFA) 108 (90 Base) MCG/ACT inhaler Inhale 1 puff into the lungs every 4 (four) hours as needed.     . ALPRAZolam (XANAX) 1 MG tablet Take 1 mg by mouth daily.   08/30/2017 at 2200  . amiodarone (PACERONE) 200 MG tablet Take 200 mg by mouth every other day.    08/30/2017 at 0700  . budesonide-formoterol (SYMBICORT) 160-4.5 MCG/ACT inhaler Inhale 2 puffs into the lungs 2 (two) times daily.   Past Week at Unknown time  . carvedilol (COREG) 25 MG tablet Take 12.5-25 mg by mouth 2  (two) times daily with a meal. Take 12.5mg  in the morning and 25mg  in the evening   08/30/2017 at 2200  . clopidogrel (PLAVIX) 75 MG tablet Take 75 mg by mouth daily.    08/30/2017 at 2200  . fenofibrate 160 MG tablet Take 160 mg by mouth daily.   08/30/2017 at 2200  . FLUoxetine (PROZAC) 20 MG capsule Take 60 mg by mouth daily.    08/30/2017 at 2200  . furosemide (LASIX) 40 MG tablet Take 40 mg by mouth daily as needed.   08/29/2017 at Unknown time  . levothyroxine (SYNTHROID, LEVOTHROID) 200 MCG tablet Take 200 mcg by mouth daily.    08/30/2017 at 0700  . lisinopril (PRINIVIL,ZESTRIL) 40 MG tablet Take 40 mg by mouth daily.   08/30/2017 at 2200  . oxyCODONE-acetaminophen (PERCOCET) 7.5-325 MG per tablet Take 1 tablet by mouth every 6 (six) hours as needed.   Past Week at Unknown time  . potassium chloride SA (K-DUR,KLOR-CON) 20 MEQ tablet Take 20 mEq by mouth daily as needed.    08/30/2017 at 2200  . simvastatin (ZOCOR) 40 MG tablet Take 40 mg by mouth every evening.   08/30/2017 at 2200  . warfarin (COUMADIN) 2 MG tablet Take 2 mg by mouth as directed. 1 tablet daily except 1/2 tablet on Sunday & Wednesday    08/30/2017 at 2200  Assessment: 62 y.o. female, with history of atrial fibrillation, WPW syndrome, AICD, hypertension on chronic anticoagulation with Coumadin, mechanical heart valve, hypertension, thyroid cancer, anxiety who came to ED after patient fell at home. INR significantly elevated in ED  And Vit. K 5mg  IV given. Patients INR is subtherapeutic today and will restart warfarin.   Goal of Therapy:  INR 2.5-3.5 Monitor platelets by anticoagulation protocol: Yes   Plan:  Warfarin 2 mg x 1 dose Daily PT-INR Monitor for S/S of bleeding  Margot Ables, PharmD Clinical Pharmacist 09/01/2017 11:17 AM

## 2017-09-02 ENCOUNTER — Encounter (HOSPITAL_COMMUNITY): Payer: Self-pay | Admitting: Behavioral Health

## 2017-09-02 LAB — CBC
HCT: 32.1 % — ABNORMAL LOW (ref 36.0–46.0)
HEMOGLOBIN: 9.6 g/dL — AB (ref 12.0–15.0)
MCH: 27 pg (ref 26.0–34.0)
MCHC: 29.9 g/dL — AB (ref 30.0–36.0)
MCV: 90.4 fL (ref 78.0–100.0)
Platelets: 153 10*3/uL (ref 150–400)
RBC: 3.55 MIL/uL — ABNORMAL LOW (ref 3.87–5.11)
RDW: 15.2 % (ref 11.5–15.5)
WBC: 6 10*3/uL (ref 4.0–10.5)

## 2017-09-02 LAB — COMPREHENSIVE METABOLIC PANEL
ALT: 17 U/L (ref 0–44)
ANION GAP: 7 (ref 5–15)
AST: 30 U/L (ref 15–41)
Albumin: 3.8 g/dL (ref 3.5–5.0)
Alkaline Phosphatase: 31 U/L — ABNORMAL LOW (ref 38–126)
BUN: 20 mg/dL (ref 8–23)
CO2: 31 mmol/L (ref 22–32)
Calcium: 8.9 mg/dL (ref 8.9–10.3)
Chloride: 100 mmol/L (ref 98–111)
Creatinine, Ser: 0.98 mg/dL (ref 0.44–1.00)
GFR calc Af Amer: >60 mL/min (ref >60)
GFR calc non Af Amer: >60 mL/min (ref >60)
Glucose, Bld: 199 mg/dL — ABNORMAL HIGH (ref 70–99)
POTASSIUM: 4.6 mmol/L (ref 3.5–5.1)
SODIUM: 138 mmol/L (ref 135–145)
Total Bilirubin: 1.1 mg/dL (ref 0.3–1.2)
Total Protein: 7.3 g/dL (ref 6.5–8.1)

## 2017-09-02 LAB — PROTIME-INR
INR: 1.64
PROTHROMBIN TIME: 19.3 s — AB (ref 11.4–15.2)

## 2017-09-02 LAB — HEPARIN LEVEL (UNFRACTIONATED): Heparin Unfractionated: 0.2 IU/mL — ABNORMAL LOW (ref 0.30–0.70)

## 2017-09-02 MED ORDER — HEPARIN (PORCINE) IN NACL 100-0.45 UNIT/ML-% IJ SOLN
1600.0000 [IU]/h | INTRAMUSCULAR | Status: DC
  Administered 2017-09-02: 1250 [IU]/h via INTRAVENOUS
  Administered 2017-09-03: 1600 [IU]/h via INTRAVENOUS
  Filled 2017-09-02: qty 250

## 2017-09-02 MED ORDER — WARFARIN SODIUM 2 MG PO TABS
3.0000 mg | ORAL_TABLET | Freq: Once | ORAL | Status: AC
  Administered 2017-09-02: 3 mg via ORAL
  Filled 2017-09-02: qty 1

## 2017-09-02 MED ORDER — IPRATROPIUM-ALBUTEROL 0.5-2.5 (3) MG/3ML IN SOLN
3.0000 mL | Freq: Three times a day (TID) | RESPIRATORY_TRACT | Status: DC
  Administered 2017-09-03 – 2017-09-04 (×4): 3 mL via RESPIRATORY_TRACT
  Filled 2017-09-02 (×7): qty 3

## 2017-09-02 MED ORDER — HEPARIN BOLUS VIA INFUSION
1200.0000 [IU] | Freq: Once | INTRAVENOUS | Status: AC
  Administered 2017-09-02: 1200 [IU] via INTRAVENOUS
  Filled 2017-09-02: qty 1200

## 2017-09-02 MED ORDER — ATORVASTATIN CALCIUM 20 MG PO TABS
20.0000 mg | ORAL_TABLET | Freq: Every day | ORAL | Status: DC
  Administered 2017-09-02 – 2017-09-04 (×3): 20 mg via ORAL
  Filled 2017-09-02 (×3): qty 1

## 2017-09-02 NOTE — Progress Notes (Signed)
ANTICOAGULATION CONSULT NOTE - Initial Consult  Pharmacy Consult for heparin Indication: atrial fibrillation - mechanical mitral valve  Allergies  Allergen Reactions  . Codeine     Patient Measurements: Height: 5\' 7"  (170.2 cm) Weight: 204 lb (92.5 kg) IBW/kg (Calculated) : 61.6 Heparin Dosing Weight: 81.7 kg  Vital Signs: Temp: 98.1 F (36.7 C) (07/31 0605) Temp Source: Oral (07/31 0605) BP: 121/56 (07/31 0605) Pulse Rate: 70 (07/31 0605)  Labs: Recent Labs    08/31/17 0605 09/01/17 0608 09/02/17 0501  HGB 10.0* 9.8* 9.6*  HCT 32.9* 32.0* 32.1*  PLT 155 160 153  LABPROT 37.3* 22.8* 19.3*  INR 3.82 2.03 1.64  CREATININE 0.79 0.86 0.98    Estimated Creatinine Clearance: 68.6 mL/min (by C-G formula based on SCr of 0.98 mg/dL).   Medical History: Past Medical History:  Diagnosis Date  . Anxiety   . Atrial fibrillation ([place])   . Automatic implantable cardiac defibrillator in situ    Lumax DR-T 09-20-2007 Dr.Akbary  . Depression   . Hyperlipidemia   . 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 Hypertension   . Lump or mass in breast    at the 7 o'clock position of right breast(non-tender)  . Other abnormal glucose   . Other left bundle branch block   . Other primary cardiomyopathies   . Thyroid cancer ([place])   . Thyroid disease   . Unspecified vitamin D deficiency   . Wolff-Parkinson-White syndrome     Medications:  Medications Prior to Admission  Medication Sig Dispense Refill Last Dose  . albuterol (PROAIR HFA) 108 (90 Base) MCG/ACT inhaler Inhale 1 puff into the lungs every 4 (four) hours as needed.     . ALPRAZolam (XANAX) 1 MG tablet Take 1 mg by mouth daily.   08/30/2017 at 2200  . amiodarone (PACERONE) 200 MG tablet Take 200 mg by mouth every other day.    08/30/2017 at 0700  . budesonide-formoterol (SYMBICORT) 160-4.5 MCG/ACT inhaler Inhale 2 puffs into the lungs 2 (two) times daily.   Past Week at Unknown time  . carvedilol  (COREG) 25 MG tablet Take 12.5-25 mg by mouth 2 (two) times daily with a meal. Take 12.5mg  in the morning and 25mg  in the evening   08/30/2017 at 2200  . clopidogrel (PLAVIX) 75 MG tablet Take 75 mg by mouth daily.    08/30/2017 at 2200  . fenofibrate 160 MG tablet Take 160 mg by mouth daily.   08/30/2017 at 2200  . FLUoxetine (PROZAC) 20 MG capsule Take 60 mg by mouth daily.    08/30/2017 at 2200  . furosemide (LASIX) 40 MG tablet Take 40 mg by mouth daily as needed.   08/29/2017 at Unknown time  . levothyroxine (SYNTHROID, LEVOTHROID) 200 MCG tablet Take 200 mcg by mouth daily.    08/30/2017 at 0700  . lisinopril (PRINIVIL,ZESTRIL) 40 MG tablet Take 40 mg by mouth daily.   08/30/2017 at 2200  . oxyCODONE-acetaminophen (PERCOCET) 7.5-325 MG per tablet Take 1 tablet by mouth every 6 (six) hours as needed.   Past Week at Unknown time  . potassium chloride SA (K-DUR,KLOR-CON) 20 MEQ tablet Take 20 mEq by mouth daily as needed.    08/30/2017 at 2200  . simvastatin (ZOCOR) 40 MG tablet Take 40 mg by mouth every evening.   08/30/2017 at 2200  . warfarin (COUMADIN) 2 MG tablet Take 2 mg by mouth as directed. 1 tablet daily except 1/2 tablet on Sunday &  Wednesday    08/30/2017 at 2200    Assessment: Pharmacy consulted to dose heparin for patient with mechanical mitral valve. Patient is also taking coumadin. Patient came in after falling at home with INR elevated >10. Vitamin K 5 mg IV was given and now INR subtherapeutic for past two days. Per anti-coag notes, patient takes 17.5 mg/week of coumadin.  Goal of Therapy:  INR 2.5-3.5 Heparin level 0.3-0.7 units/ml Monitor platelets by anticoagulation protocol: Yes   Plan:  Warfarin 3 mg x 1 dose Start heparin infusion at 1250  units/hr Check anti-Xa level in 6 hours and daily while on heparin Continue to monitor H&H and platelets  Monitor INR daily and s/s of bleeding.  Revonda Standard Jaxton Casale 09/02/2017,10:18 AM

## 2017-09-02 NOTE — Progress Notes (Signed)
PROGRESS NOTE    Ana Martin  ZOX:[id number] DOB: 05-22-1955 DOA: 08/30/2017 PCP: Kristopher Glee., MD    Brief Narrative:  62 year old female with a history of WPW syndrome, AICD, mechanical mitral valve, on chronic anticoagulation, presents to the emergency room after having a fall.  Patient was noted to be somnolent.  She is chronically on benzodiazepines.  Urine drug screen positive for benzodiazepines and amphetamines.  She was admitted for further management.   Assessment & Plan:   Active Problems:   Elevated INR   Syncope   Anticoagulant overdosage   Wolff-Parkinson-White syndrome   COPD (chronic obstructive pulmonary disease) (HCC)   Pleuritic chest pain   Former smoker   1. Intentional overdose of benzodiazepines.  Urine tox screen was positive for amphetamines and benzodiazepines.  Patient reports that she had taken extra Xanax pills due to her social stressors in an attempt to end her life.  She does admit to being very depressed.  She denies any suicidal ideations at this time.  Seen by behavioral health who recommended inpatient treatment. 2. Fall.  Related to overdose of benzodiazepines.  Extensive bruising noted over trunk and extremities related to trauma or fall in the setting of markedly elevated INR.  CT imaging did not show any deep tissue injury or bony injury. 3. Coagulopathy/elevated INR.  Related to Coumadin.  She received vitamin K and FFP.  Coumadin is since trending down.  Pharmacy is monitoring. 4. History of mitral mechanical heart valve.  She is anticoagulated with Coumadin.  INR is now subtherapeutic.  She is been started on heparin bridge. 5. Hypertension.  Continue on Coreg.  Blood pressure currently stable.  Lisinopril has been held. 6. Hypothyroidism.  Continue on Synthroid 7. Acute respiratory failure.  Initially requiring 4 L of oxygen.  Initial chest x-ray/chest CT did not show any acute findings.  She does have a history of COPD.  Currently on  bronchodilators.  Weaning off oxygen as tolerated. 8. COPD with exacerbation.  Continue on bronchodilators.  Breathing appears to be improving.  Less wheezing today.  Continue current treatments.  If she continues to improve, anticipate transitioning to prednisone tomorrow 9. Cardiomyopathy.  Echo shows ejection fraction of 30 to 35%.  Patient reports that this may be a chronic finding.  She follows with a cardiologist in [facility].  Records have been requested.  She is requesting further follow-up with [facility] cardiology.  We will make these arrangements prior to discharge. 10. History of atrial fibrillation.  She is anticoagulated with Coumadin.  She is status post AICD and EKG shows atrial paced rhythm. 11. Bilateral pulmonary nodules.  Seen on chest CT.  Will need follow-up CT chest in 3 to 6 months.   DVT prophylaxis: Coumadin/heparin infusion Code Status: Full code Family Communication: Discussed at length with sister who is her power of attorney Disposition Plan: Seen by behavioral health service and recommendations are for inpatient psychiatry treatment on discharge.  Social work is assisting with placement   Consultants:   Behavioral health  Procedures:  Echo:- Left ventricle: The cavity size was normal. Wall thickness was   normal. Systolic function was moderately to severely reduced. The   estimated ejection fraction was in the range of 30% to 35%.   Cannot evaluate diastolic function in setting of MVR. - Aortic valve: Valve area (VTI): 1.54 cm^2. - Mitral valve: There is a mechanical MV in the MV position,   unknown type and size. There was no  evidence for stenosis. There   was no significant regurgitation. Mean gradient (D): 5 mm Hg. - Left atrium: The atrium was mildly dilated. - Right ventricle: The cavity size was mildly dilated. - Right atrium: The atrium was mildly dilated.  - Technically difficult study.  YBW:LSLH is a normal drowsy and asleep  electroencephalogram.  No epileptiform activity is noted.  Antimicrobials:      Subjective: Continues to complain of diffuse body pains.  Feels that breathing is a little better today.  Was able to ambulate with physical therapy.  Objective: Vitals:   09/02/17 1614 09/02/17 1615 09/02/17 1749 09/02/17 1750  BP: (!) 107/45   (!) 105/53  Pulse: 70   84  Resp: 20   18  Temp: 98.2 F (36.8 C)   98 F (36.7 C)  TempSrc: Oral   Oral  SpO2: 93% 95% (!) 89% 93%  Weight:      Height:        Intake/Output Summary (Last 24 hours) at 09/02/2017 1842 Last data filed at 09/02/2017 1458 Gross per 24 hour  Intake 70.29 ml  Output -  Net 70.29 ml   Filed Weights   08/30/17 2206 08/31/17 0301  Weight: 90.7 kg (200 lb) 92.5 kg (204 lb)    Examination:  General exam: Alert, awake, oriented x 3 Respiratory system: diminished breath sounds bilaterally. Respiratory effort normal. Cardiovascular system:RRR. No murmurs, rubs, gallops. Gastrointestinal system: Abdomen is nondistended, soft and nontender. No organomegaly or masses felt. Normal bowel sounds heard. Central nervous system: Alert and oriented. No focal neurological deficits. Extremities: No C/C/E, +pedal pulses Skin: widespread bruising over extremities and trunk Psychiatry: appears to be depressed and withdrawn    Data Reviewed: I have personally reviewed following labs and imaging studies  CBC: Recent Labs  Lab 08/30/17 2245 08/31/17 0605 09/01/17 0608 09/02/17 0501  WBC 5.4 5.3 6.4 6.0  HGB 11.1* 10.0* 9.8* 9.6*  HCT 36.0 32.9* 32.0* 32.1*  MCV 89.8 89.9 89.6 90.4  PLT 166 155 160 734   Basic Metabolic Panel: Recent Labs  Lab 08/30/17 2245 08/31/17 0605 09/01/17 0608 09/02/17 0501  NA 138 138 137 138  K 4.1 3.7 4.1 4.6  CL 101 102 101 100  CO2 29 30 29 31   GLUCOSE 82 80 101* 199*  BUN 11 10 13 20   CREATININE 0.84 0.79 0.86 0.98  CALCIUM 9.0 8.8* 8.7* 8.9  MG  --   --  1.7  --    GFR: Estimated  Creatinine Clearance: 68.6 mL/min (by C-G formula based on SCr of 0.98 mg/dL). Liver Function Tests: Recent Labs  Lab 08/30/17 2245 08/31/17 0605 09/01/17 0608 09/02/17 0501  AST 51* 42* 32 30  ALT 22 19 17 17   ALKPHOS 27* 29* 31* 31*  BILITOT 1.2 1.1 1.2 1.1  PROT 7.2 6.8 7.1 7.3  ALBUMIN 3.8 3.7 3.8 3.8   No results for input(s): LIPASE, AMYLASE in the last 168 hours. No results for input(s): AMMONIA in the last 168 hours. Coagulation Profile: Recent Labs  Lab 08/30/17 2245 08/31/17 0605 09/01/17 0608 09/02/17 0501  INR >10.00* 3.82 2.03 1.64   Cardiac Enzymes: No results for input(s): CKTOTAL, CKMB, CKMBINDEX, TROPONINI in the last 168 hours. BNP (last 3 results) No results for input(s): PROBNP in the last 8760 hours. HbA1C: No results for input(s): HGBA1C in the last 72 hours. CBG: No results for input(s): GLUCAP in the last 168 hours. Lipid Profile: No results for input(s): CHOL, HDL, LDLCALC, TRIG, CHOLHDL,  LDLDIRECT in the last 72 hours. Thyroid Function Tests: No results for input(s): TSH, T4TOTAL, FREET4, T3FREE, THYROIDAB in the last 72 hours. Anemia Panel: No results for input(s): VITAMINB12, FOLATE, FERRITIN, TIBC, IRON, RETICCTPCT in the last 72 hours. Sepsis Labs: No results for input(s): PROCALCITON, LATICACIDVEN in the last 168 hours.  No results found for this or any previous visit (from the past 240 hour(s)).       Radiology Studies: No results found.      Scheduled Meds: . ALPRAZolam  0.5 mg Oral Daily  . amiodarone  200 mg Oral Daily  . atorvastatin  20 mg Oral q1800  . carvedilol  12.5 mg Oral Q breakfast  . carvedilol  25 mg Oral Q supper  . clopidogrel  75 mg Oral Daily  . fenofibrate  160 mg Oral Daily  . FLUoxetine  60 mg Oral QHS  . furosemide  40 mg Oral Daily  . guaiFENesin  1,200 mg Oral BID  . ipratropium-albuterol  3 mL Nebulization Q6H  . levothyroxine  200 mcg Oral QAC breakfast  . lisinopril  20 mg Oral Daily  .  mouth rinse  15 mL Mouth Rinse BID  . methylPREDNISolone (SOLU-MEDROL) injection  60 mg Intravenous Q12H  . mometasone-formoterol  2 puff Inhalation BID  . potassium chloride SA  20 mEq Oral Daily  . sodium chloride flush  3 mL Intravenous Q12H  . Warfarin - Pharmacist Dosing Inpatient   Does not apply q1800   Continuous Infusions: . sodium chloride    . heparin 1,450 Units/hr (09/02/17 1746)     LOS: 2 days    Time spent: 25 minutes    Kathie Dike, MD [facility] Pager [phone]  If 7PM-7AM, please contact night-coverage [email or web address] Password [facility] 09/02/2017, 6:42 PM

## 2017-09-02 NOTE — Clinical Social Work Note (Signed)
Debarah Crape, [place] social worker, contacted LCSW and was informed of the current situation with patient's grandchildren. Ms. Cletus Gash indicated that she would follow up with the family.   LCSW signing off.      Zaineb Nowaczyk, Clydene Pugh, LCSW

## 2017-09-02 NOTE — Progress Notes (Signed)
ANTICOAGULATION CONSULT NOTE - Initial Consult  Pharmacy Consult for heparin Indication: atrial fibrillation - mechanical mitral valve  Allergies  Allergen Reactions  . Codeine     Patient Measurements: Height: 5\' 7"  (170.2 cm) Weight: 204 lb (92.5 kg) IBW/kg (Calculated) : 61.6 Heparin Dosing Weight: 81.7 kg  Vital Signs: Temp: 98.2 F (36.8 C) (07/31 1614) Temp Source: Oral (07/31 1614) BP: 107/45 (07/31 1614) Pulse Rate: 70 (07/31 1614)  Labs: Recent Labs    08/31/17 0605 09/01/17 0608 09/02/17 0501 09/02/17 1618  HGB 10.0* 9.8* 9.6*  --   HCT 32.9* 32.0* 32.1*  --   PLT 155 160 153  --   LABPROT 37.3* 22.8* 19.3*  --   INR 3.82 2.03 1.64  --   HEPARINUNFRC  --   --   --  0.20*  CREATININE 0.79 0.86 0.98  --     Estimated Creatinine Clearance: 68.6 mL/min (by C-G formula based on SCr of 0.98 mg/dL).   Medical History: Past Medical History:  Diagnosis Date  . Anxiety   . Atrial fibrillation ([place])   . Automatic implantable cardiac defibrillator in situ    Lumax DR-T 09-20-2007 Dr.Akbary  . Depression   . Hyperlipidemia   . 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 Hypertension   . Lump or mass in breast    at the 7 o'clock position of right breast(non-tender)  . Other abnormal glucose   . Other left bundle branch block   . Other primary cardiomyopathies   . Thyroid cancer ([place])   . Thyroid disease   . Unspecified vitamin D deficiency   . Wolff-Parkinson-White syndrome     Medications:  Medications Prior to Admission  Medication Sig Dispense Refill Last Dose  . albuterol (PROAIR HFA) 108 (90 Base) MCG/ACT inhaler Inhale 1 puff into the lungs every 4 (four) hours as needed.     . ALPRAZolam (XANAX) 1 MG tablet Take 1 mg by mouth daily.   08/30/2017 at 2200  . amiodarone (PACERONE) 200 MG tablet Take 200 mg by mouth every other day.    08/30/2017 at 0700  . budesonide-formoterol (SYMBICORT) 160-4.5 MCG/ACT inhaler Inhale 2 puffs  into the lungs 2 (two) times daily.   Past Week at Unknown time  . carvedilol (COREG) 25 MG tablet Take 12.5-25 mg by mouth 2 (two) times daily with a meal. Take 12.5mg  in the morning and 25mg  in the evening   08/30/2017 at 2200  . clopidogrel (PLAVIX) 75 MG tablet Take 75 mg by mouth daily.    08/30/2017 at 2200  . fenofibrate 160 MG tablet Take 160 mg by mouth daily.   08/30/2017 at 2200  . FLUoxetine (PROZAC) 20 MG capsule Take 60 mg by mouth daily.    08/30/2017 at 2200  . furosemide (LASIX) 40 MG tablet Take 40 mg by mouth daily as needed.   08/29/2017 at Unknown time  . levothyroxine (SYNTHROID, LEVOTHROID) 200 MCG tablet Take 200 mcg by mouth daily.    08/30/2017 at 0700  . lisinopril (PRINIVIL,ZESTRIL) 40 MG tablet Take 40 mg by mouth daily.   08/30/2017 at 2200  . oxyCODONE-acetaminophen (PERCOCET) 7.5-325 MG per tablet Take 1 tablet by mouth every 6 (six) hours as needed.   Past Week at Unknown time  . potassium chloride SA (K-DUR,KLOR-CON) 20 MEQ tablet Take 20 mEq by mouth daily as needed.    08/30/2017 at 2200  . simvastatin (ZOCOR) 40 MG tablet Take 40 mg  by mouth every evening.   08/30/2017 at 2200  . warfarin (COUMADIN) 2 MG tablet Take 2 mg by mouth as directed. 1 tablet daily except 1/2 tablet on Sunday & Wednesday    08/30/2017 at 2200    Assessment: Pharmacy consulted to dose heparin for patient with mechanical mitral valve. Patient is also taking coumadin. Patient came in after falling at home with INR elevated >10. Vitamin K 5 mg IV was given and now INR subtherapeutic for past two days. Per anti-coag notes, patient takes 17.5 mg/week of coumadin.  Goal of Therapy:  INR 2.5-3.5 Heparin level 0.3-0.7 units/ml Monitor platelets by anticoagulation protocol: Yes   Plan:  Warfarin 3 mg x 1 dose Start heparin infusion at 1250  units/hr Check anti-Xa level in 6 hours and daily while on heparin Continue to monitor H&H and platelets  Monitor INR daily and s/s of bleeding.  7/31@1700   HL 0.2, Subtherapeutic. Will bolus 15 units/kg (1200units) and increase rate from 1250units/hr to 1450 units/hr. Will recheck heparin level in 6 hours per protocol and continue to watch for signs of bleeding.   Donna Christen Geramy Lamorte 09/02/2017,5:09 PM

## 2017-09-02 NOTE — BH Assessment (Signed)
Tele Assessment Note   Patient Name: Ana Martin MRN: [id number] Referring Physician: EDP Location of Patient: [facility] Location of Provider: Behavioral Health TTS Department  Ana Martin is a 62 y.o. female who presented to [facility] on voluntary basis following intentional overdose of 30+ Xanax tabs in a self-described suicide attempt.  Pt has not been assessed by TTS before.  Pt reported as follows:  She lives alone in [place] and is on disability.  Pt stated that she has been treated for depression for over 20 years, taking Prozac prescribed by her PCP.  Pt reported that recently she has had conflict with her daughter -- daughter and daughter's boyfriend moved into Pt's home due to financial distress, and Pt and daughter had a large conflict.  On Sunday evening, Pt became despondent and intentionally overdosed on 30+ tabs of Xanax.  'I hoped my heart would stop and I would die.''  Pt denied any past suicide attempts.  In addition to this recent suicide attempt, Pt endorsed persistent and unremitting despondency, insomnia (about 3 hours per night), feelings of hopelessness and worthlessness, and isolation.  Pt denied substance use concerns, homicidal ideation, hallucination, and self-injurious behavior.  Pt denied any past or present psychiatric treatment with a therapist or psychiatrist.  She denied any outpatient treatment.  During assessment, Pt presented as alert and oriented.  She had good eye contact and was cooperative.  Pt was dressed in scrubs.  Demeanor was calm.  Pt's mood was depressed, and affect was mood-congruent.  Pt endorsed recent suicide attempt, as well as despondency and other depressive symptoms.  Pt denied substance use, psychotic symptoms, and self-injurious behavior.  Pt's speech was normal in rate, rhythm, and volume.  Pt's thought processes were within normal range, and thought content was logical and goal-oriented.  There was no evidence of delusion.  Pt's memory and  concentration were intact.  Insight, judgment, and impulse control were poor.  Consulted with S. Rankin, NP, who determined that Pt meets inpatient criteria.  Diagnosis: F33.2 Major Depressive Disorder, Recurrent Ep., Severe w/o psychotic features  Past Medical History:  Past Medical History:  Diagnosis Date  . Anxiety   . Atrial fibrillation ([place])   . Automatic implantable cardiac defibrillator in situ    Lumax DR-T 09-20-2007 Dr.Akbary  . Depression   . Hyperlipidemia   . 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 Hypertension   . Lump or mass in breast    at the 7 o'clock position of right breast(non-tender)  . Other abnormal glucose   . Other left bundle branch block   . Other primary cardiomyopathies   . Thyroid cancer ([place])   . Thyroid disease   . Unspecified vitamin D deficiency   . Wolff-Parkinson-White syndrome     Past Surgical History:  Procedure Laterality Date  . ABDOMINAL HYSTERECTOMY    . CARDIAC VALVE REPLACEMENT     [facility] silzone mitray mechanical [id number] ss# [id number]  . COLONOSCOPY     April 12, 2008  . PACEMAKER PLACEMENT      Family History:  Family History  Problem Relation Age of Onset  . Lung cancer Father        expired 53  . Sarcoidosis Mother        espired 2013    Social History:  reports that she quit smoking about 7 years ago. Her smoking use included cigarettes. She has a 25.00 pack-year smoking history. She does not have  any smokeless tobacco history on file. She reports that she does not drink alcohol or use drugs.  Additional Social History:  Alcohol / Drug Use Pain Medications: See MAR Prescriptions: See MAR Over the Counter: See MAR History of alcohol / drug use?: No history of alcohol / drug abuse  CIWA: CIWA-Ar BP: (!) 121/56 Pulse Rate: 70 COWS:    Allergies:  Allergies  Allergen Reactions  . Codeine     Home Medications:  Medications Prior to Admission  Medication Sig Dispense Refill   . albuterol (PROAIR HFA) 108 (90 Base) MCG/ACT inhaler Inhale 1 puff into the lungs every 4 (four) hours as needed.    . ALPRAZolam (XANAX) 1 MG tablet Take 1 mg by mouth daily.    Marland Kitchen amiodarone (PACERONE) 200 MG tablet Take 200 mg by mouth every other day.     . budesonide-formoterol (SYMBICORT) 160-4.5 MCG/ACT inhaler Inhale 2 puffs into the lungs 2 (two) times daily.    . carvedilol (COREG) 25 MG tablet Take 12.5-25 mg by mouth 2 (two) times daily with a meal. Take 12.5mg  in the morning and 25mg  in the evening    . clopidogrel (PLAVIX) 75 MG tablet Take 75 mg by mouth daily.     . fenofibrate 160 MG tablet Take 160 mg by mouth daily.    Marland Kitchen FLUoxetine (PROZAC) 20 MG capsule Take 60 mg by mouth daily.     . furosemide (LASIX) 40 MG tablet Take 40 mg by mouth daily as needed.    Marland Kitchen levothyroxine (SYNTHROID, LEVOTHROID) 200 MCG tablet Take 200 mcg by mouth daily.     Marland Kitchen lisinopril (PRINIVIL,ZESTRIL) 40 MG tablet Take 40 mg by mouth daily.    Marland Kitchen oxyCODONE-acetaminophen (PERCOCET) 7.5-325 MG per tablet Take 1 tablet by mouth every 6 (six) hours as needed.    . potassium chloride SA (K-DUR,KLOR-CON) 20 MEQ tablet Take 20 mEq by mouth daily as needed.     . simvastatin (ZOCOR) 40 MG tablet Take 40 mg by mouth every evening.    . warfarin (COUMADIN) 2 MG tablet Take 2 mg by mouth as directed. 1 tablet daily except 1/2 tablet on Sunday & Wednesday       OB/GYN Status:  No LMP recorded. Patient has had a hysterectomy.  General Assessment Data Location of Assessment: [facility] TTS Assessment: In system Is this a Tele or Face-to-Face Assessment?: Tele Assessment Is this an Initial Assessment or a Re-assessment for this encounter?: Initial Assessment Marital status: Widowed [place] name: [place] Is patient pregnant?: No Pregnancy Status: No Living Arrangements: Alone Can pt return to current living arrangement?: Yes Admission Status: Voluntary Is patient capable of signing voluntary admission?:  Yes Referral Source: Self/Family/Friend Insurance type: [employer]     Crisis Care Plan Living Arrangements: Alone Legal Guardian: (None) Name of Psychiatrist: None Name of Therapist: None  Education Status Is patient currently in school?: No Is the patient employed, unemployed or receiving disability?: Receiving disability income  Risk to self with the past 6 months Suicidal Ideation: Yes-Currently Present Has patient been a risk to self within the past 6 months prior to admission? : No Suicidal Intent: Yes-Currently Present Has patient had any suicidal intent within the past 6 months prior to admission? : No Is patient at risk for suicide?: Yes Suicidal Plan?: Yes-Currently Present Has patient had any suicidal plan within the past 6 months prior to admission? : No Specify Current Suicidal Plan: Pt intentionally ingested 30 39 Xanax in suicide attempt Access to  Means: Yes Specify Access to Suicidal Means: Prescribed medicine Previous Attempts/Gestures: No Intentional Self Injurious Behavior: None Family Suicide History: Unknown Recent stressful life event(s): Conflict (Comment)(Conflict with daughter) Persecutory voices/beliefs?: No Depression: Yes Depression Symptoms: Despondent, Insomnia, Fatigue, Loss of interest in usual pleasures, Feeling worthless/self pity Substance abuse history and/or treatment for substance abuse?: No Suicide prevention information given to non-admitted patients: Not applicable  Risk to Others within the past 6 months Homicidal Ideation: No Does patient have any lifetime risk of violence toward others beyond the six months prior to admission? : No Thoughts of Harm to Others: No Current Homicidal Intent: No Current Homicidal Plan: No Access to Homicidal Means: No History of harm to others?: No Assessment of Violence: None Noted Does patient have access to weapons?: No Criminal Charges Pending?: No Does patient have a court date: No Is  patient on probation?: No  Psychosis Hallucinations: None noted Delusions: None noted  Mental Status Report Appearance/Hygiene: Unremarkable, In scrubs Eye Contact: Good Motor Activity: Unremarkable, Freedom of movement Speech: Unremarkable Level of Consciousness: Alert Mood: Depressed Affect: Appropriate to circumstance Anxiety Level: None Thought Processes: Coherent, Relevant Judgement: Impaired Orientation: Person, Place, Time, Situation Obsessive Compulsive Thoughts/Behaviors: None  Cognitive Functioning Concentration: Normal Memory: Recent Intact, Remote Intact Is patient IDD: No Is patient DD?: No Insight: Poor Impulse Control: Poor Appetite: Good Have you had any weight changes? : No Change Sleep: Decreased Total Hours of Sleep: 3 Vegetative Symptoms: None  ADLScreening [facility] Assessment Services) Patient's cognitive ability adequate to safely complete daily activities?: Yes Patient able to express need for assistance with ADLs?: Yes Independently performs ADLs?: Yes (appropriate for developmental age)  Prior Inpatient Therapy Prior Inpatient Therapy: No  Prior Outpatient Therapy Prior Outpatient Therapy: No Does patient have an ACCT team?: No Does patient have Intensive In-House Services?  : No Does patient have [facility] services? : No Does patient have P4CC services?: No  ADL Screening (condition at time of admission) Patient's cognitive ability adequate to safely complete daily activities?: Yes Is the patient deaf or have difficulty hearing?: No Does the patient have difficulty seeing, even when wearing glasses/contacts?: No Does the patient have difficulty concentrating, remembering, or making decisions?: No Patient able to express need for assistance with ADLs?: Yes Does the patient have difficulty dressing or bathing?: Yes Independently performs ADLs?: Yes (appropriate for developmental age) Communication: Independent Dressing (OT): Independent Is  this a change from baseline?: Change from baseline, expected to last <3days Grooming: Independent Feeding: Independent Bathing: Needs assistance Is this a change from baseline?: Change from baseline, expected to last <3 days Toileting: Needs assistance Is this a change from baseline?: Change from baseline, expected to last <3 days In/Out Bed: Needs assistance Is this a change from baseline?: Change from baseline, expected to last <3 days Walks in Home: Needs assistance Is this a change from baseline?: Change from baseline, expected to last <3 days Does the patient have difficulty walking or climbing stairs?: No Weakness of Legs: None Weakness of Arms/Hands: None  Home Assistive Devices/Equipment Home Assistive Devices/Equipment: None  Therapy Consults (therapy consults require a physician order) PT Evaluation Needed: No OT Evalulation Needed: No SLP Evaluation Needed: No Abuse/Neglect Assessment (Assessment to be complete while patient is alone) Abuse/Neglect Assessment Can Be Completed: Yes Physical Abuse: Denies Verbal Abuse: Denies Sexual Abuse: Denies Exploitation of patient/patient's resources: Denies Self-Neglect: Denies Values / Beliefs Cultural Requests During Hospitalization: None Spiritual Requests During Hospitalization: None Consults Spiritual Care Consult Needed: No Social Work Consult Needed:  No Advance Directives (For Healthcare) Does Patient Have a Medical Advance Directive?: No Type of Advance Directive: Healthcare Power of Attorney, Living will Copy of [place] in Chart?: No - copy requested Copy of Living Will in Chart?: No - copy requested Would patient like information on creating a medical advance directive?: No - Patient declined Nutrition Screen- [facility] Adult/WL/AP Patient's home diet: Regular Has the patient recently lost weight without trying?: No Has the patient been eating poorly because of a decreased appetite?: No Malnutrition  Screening Tool Score: 0  Additional Information 1:1 In Past 12 Months?: No CIRT Risk: No Elopement Risk: No Does patient have medical clearance?: Yes     Disposition:  Disposition Initial Assessment Completed for this Encounter: Yes Disposition of Patient: Admit Type of inpatient treatment program: Adult(Per S. Rankin, NP, Pt meets inpatient criteria)  This service was provided via telemedicine using a 2-way, interactive audio and video technology.  Names of all persons participating in this telemedicine service and their role in this encounter. Name: Ana Martin, Ana Martin Role: Patient             Marlowe Aschoff 09/02/2017 4:06 PM

## 2017-09-02 NOTE — Clinical Social Work Note (Signed)
Whitney at [place] indicated that [facility] would have to be contacted in regards to the case.  LCSW spoke with Ivin Poot, [facility] CPS Intake, who indicated that patient's eldest grandson did have an open case. Case was discussed. LCSW was advised to speak with ongoing CPS social worker, Debarah Crape [phone] or her supervisor Katharine Look [phone].   LCSW left a message for Debarah Crape requesting return contact. Her supervisor's voicemail came on as well.     Teven Mittman, Clydene Pugh, LCSW

## 2017-09-02 NOTE — Care Management Important Message (Signed)
Important Message  Patient Details  Name: Ana Martin MRN: [id number] Date of Birth: 1956/02/03   Medicare Important Message Given:  Yes Pt. Unable to sign.   Holli Humbles Smith 09/02/2017, 12:59 PM

## 2017-09-02 NOTE — Progress Notes (Signed)
Received call from Behavioral health practitioner regarding TTS consult placed. telepsych computer placed in room and connected for TTS consult. Patient verbalized understanding of consult to take place via computer. Donavan Foil, RN

## 2017-09-02 NOTE — Progress Notes (Addendum)
Physical Therapy Treatment Patient Details Name: Ana Martin MRN: [id number] DOB: 1955-07-31 Today's Date: 09/02/2017    History of Present Illness Ana Martin  is a 62 y.o. female, with history of atrial fibrillation, WPW syndrome, AICD, hypertension on chronic anticoagulation with Coumadin, mechanical heart valve, hypertension, thyroid cancer, anxiety who came to ED after patient fell at home.  Patient says that she fell into her closet and complains of pain in the arm chest groin.  She denies passing out but does not remember falling.  Patient says that she woke up on the floor.    PT Comments    Patient presents more alert and tolerated increased activity, able to stand over sink to wash hands prior to gait training, demonstrates slow labored cadence without loss of balance, but has difficulty making turns due to mild staggering, limited mostly due to fatigue and tolerated sitting up at bedside after therapy - RN aware.  Patient will benefit from continued physical therapy in hospital and recommended venue below to increase strength, balance, endurance for safe ADLs and gait.    Follow Up Recommendations  SNF;Supervision/Assistance - 24 hour;Supervision for mobility/OOB     Equipment Recommendations  Rolling walker with 5" wheels    Recommendations for Other Services       Precautions / Restrictions Precautions Precautions: Fall Restrictions Weight Bearing Restrictions: No    Mobility  Bed Mobility Overal bed mobility: Needs Assistance Bed Mobility: Supine to Sit     Supine to sit: Supervision     General bed mobility comments: slow labored movement  Transfers Overall transfer level: Needs assistance Equipment used: Rolling walker (2 wheeled) Transfers: Sit to/from [facility] Sit to Stand: Min assist Stand pivot transfers: Min assist       General transfer comment: slow labored movement  Ambulation/Gait Ambulation/Gait assistance:  Min assist Gait Distance (Feet): 75 Feet Assistive device: Rolling walker (2 wheeled) Gait Pattern/deviations: Decreased step length - right;Decreased step length - left;Decreased stride length Gait velocity: slow   General Gait Details: demonstrates increased tolerance/distance for ambulation with slow labored cadence, occasional stops due to fatigue and had difficulty making turns due to staggering   Stairs             Wheelchair Mobility    Modified Rankin (Stroke Patients Only)       Balance Overall balance assessment: Needs assistance Sitting-balance support: Feet supported;No upper extremity supported Sitting balance-Leahy Scale: Good     Standing balance support: Bilateral upper extremity supported;During functional activity Standing balance-Leahy Scale: Fair                              Cognition Arousal/Alertness: Awake/alert Behavior During Therapy: WFL for tasks assessed/performed Overall Cognitive Status: Within Functional Limits for tasks assessed                                        Exercises      General Comments        Pertinent Vitals/Pain Pain Assessment: Faces Faces Pain Scale: Hurts a little bit Pain Location: left hand discomfort at site of skin tear that needs new dressing put on per patient Pain Descriptors / Indicators: Discomfort Pain Intervention(s): Limited activity within patient's tolerance;Monitored during session    Home Living  Prior Function            PT Goals (current goals can now be found in the care plan section) Acute Rehab PT Goals Patient Stated Goal: possibly move in with her sister for assistance PT Goal Formulation: With patient Time For Goal Achievement: 09/17/17 Potential to Achieve Goals: Good Progress towards PT goals: Progressing toward goals    Frequency    Min 3X/week      PT Plan Current plan remains appropriate     Co-evaluation              AM-PAC PT "6 Clicks" Daily Activity  Outcome Measure  Difficulty turning over in bed (including adjusting bedclothes, sheets and blankets)?: None Difficulty moving from lying on back to sitting on the side of the bed? : None Difficulty sitting down on and standing up from a chair with arms (e.g., wheelchair, bedside commode, etc,.)?: A Little Help needed moving to and from a bed to chair (including a wheelchair)?: A Little Help needed walking in hospital room?: A Little Help needed climbing 3-5 steps with a railing? : A Lot 6 Click Score: 19    End of Session Equipment Utilized During Treatment: Oxygen Activity Tolerance: Patient tolerated treatment well;Patient limited by fatigue Patient left: in bed;with call bell/phone within reach;with bed alarm set(seated at bedside) Nurse Communication: Mobility status PT Visit Diagnosis: Unsteadiness on feet (R26.81);Other abnormalities of gait and mobility (R26.89);Muscle weakness (generalized) (M62.81)     Time: 2233-6122 PT Time Calculation (min) (ACUTE ONLY): 32 min  Charges:  $Gait Training: 8-22 mins $Therapeutic Activity: 8-22 mins                     2:04 PM, 09/02/17 [name], MPT Physical Therapist with [facility] [phone] office [phone] mobile phone

## 2017-09-02 NOTE — Progress Notes (Signed)
Pt meets inpatient criteria per Earleen Newport, NP. Referral information has been sent to the following hospitals for review: [facility]   CSW will continue to assist with placement needs.   Audree Camel, LCSW, [place] Disposition [facility]/TTS [phone]

## 2017-09-02 NOTE — Progress Notes (Signed)
Heparin level 0.2 this afternoon. Notified Donna Christen Coffee, Pharmacist. Orders to follow per pharmacy. Donavan Foil, RN

## 2017-09-03 LAB — HEPARIN LEVEL (UNFRACTIONATED)
Heparin Unfractionated: 0.24 IU/mL — ABNORMAL LOW (ref 0.30–0.70)
Heparin Unfractionated: 0.67 IU/mL (ref 0.30–0.70)

## 2017-09-03 LAB — COMPREHENSIVE METABOLIC PANEL
ALT: 15 U/L (ref 0–44)
ANION GAP: 6 (ref 5–15)
AST: 25 U/L (ref 15–41)
Albumin: 3.8 g/dL (ref 3.5–5.0)
Alkaline Phosphatase: 33 U/L — ABNORMAL LOW (ref 38–126)
BILIRUBIN TOTAL: 1.1 mg/dL (ref 0.3–1.2)
BUN: 27 mg/dL — AB (ref 8–23)
CHLORIDE: 100 mmol/L (ref 98–111)
CO2: 32 mmol/L (ref 22–32)
Calcium: 9.1 mg/dL (ref 8.9–10.3)
Creatinine, Ser: 0.89 mg/dL (ref 0.44–1.00)
Glucose, Bld: 154 mg/dL — ABNORMAL HIGH (ref 70–99)
POTASSIUM: 4.7 mmol/L (ref 3.5–5.1)
Sodium: 138 mmol/L (ref 135–145)
TOTAL PROTEIN: 7.3 g/dL (ref 6.5–8.1)

## 2017-09-03 LAB — CBC
HCT: 32.3 % — ABNORMAL LOW (ref 36.0–46.0)
Hemoglobin: 9.7 g/dL — ABNORMAL LOW (ref 12.0–15.0)
MCH: 26.9 pg (ref 26.0–34.0)
MCHC: 30 g/dL (ref 30.0–36.0)
MCV: 89.7 fL (ref 78.0–100.0)
PLATELETS: 158 10*3/uL (ref 150–400)
RBC: 3.6 MIL/uL — ABNORMAL LOW (ref 3.87–5.11)
RDW: 15.1 % (ref 11.5–15.5)
WBC: 10.4 10*3/uL (ref 4.0–10.5)

## 2017-09-03 LAB — PROTIME-INR
INR: 1.89
PROTHROMBIN TIME: 21.6 s — AB (ref 11.4–15.2)

## 2017-09-03 MED ORDER — ENOXAPARIN SODIUM 100 MG/ML ~~LOC~~ SOLN
90.0000 mg | Freq: Two times a day (BID) | SUBCUTANEOUS | Status: DC
  Administered 2017-09-03 – 2017-09-04 (×3): 90 mg via SUBCUTANEOUS
  Filled 2017-09-03 (×3): qty 1

## 2017-09-03 MED ORDER — WARFARIN SODIUM 2 MG PO TABS
3.0000 mg | ORAL_TABLET | Freq: Once | ORAL | Status: AC
  Administered 2017-09-03: 3 mg via ORAL
  Filled 2017-09-03: qty 1

## 2017-09-03 MED ORDER — ALPRAZOLAM 0.5 MG PO TABS
0.5000 mg | ORAL_TABLET | Freq: Every evening | ORAL | Status: DC | PRN
  Administered 2017-09-03: 0.5 mg via ORAL
  Filled 2017-09-03: qty 1

## 2017-09-03 NOTE — Progress Notes (Signed)
Disposition CSW called and spoke to patient's nurse, Lilia Pro, RN, to ask if patient was medically cleared.  Patient's nurse confirmed that she is not medically cleared to be referred out to behavioral health treatment facilities.  Disposition CSW advised that pt's consult would be cancelled and asked that it be resubmitted once patient is stable and medically cleared.  Areatha Keas. Judi Cong, MSW, [place] Disposition Clinical Social Work [phone] (cell) [phone] (office)

## 2017-09-03 NOTE — Progress Notes (Addendum)
ANTICOAGULATION CONSULT NOTE - Initial Consult  Pharmacy Consult for heparin Indication: atrial fibrillation - mechanical mitral valve  Allergies  Allergen Reactions  . Codeine     Patient Measurements: Height: 5\' 7"  (170.2 cm) Weight: 204 lb (92.5 kg) IBW/kg (Calculated) : 61.6 Heparin Dosing Weight: 81.7 kg  Vital Signs: Temp: 97.6 F (36.4 C) (07/31 2138) Temp Source: Oral (07/31 2138) BP: 96/45 (07/31 2138) Pulse Rate: 72 (07/31 2138)  Labs: Recent Labs    08/31/17 1700 09/01/17 1749 09/02/17 0501 09/02/17 1618 09/02/17 2311  HGB 10.0* 9.8* 9.6*  --   --   HCT 32.9* 32.0* 32.1*  --   --   PLT 155 160 153  --   --   LABPROT 37.3* 22.8* 19.3*  --   --   INR 3.82 2.03 1.64  --   --   HEPARINUNFRC  --   --   --  0.20* 0.24*  CREATININE 0.79 0.86 0.98  --   --     Estimated Creatinine Clearance: 68.6 mL/min (by C-G formula based on SCr of 0.98 mg/dL).   Medical History: Past Medical History:  Diagnosis Date  . Anxiety   . Atrial fibrillation ([place])   . Automatic implantable cardiac defibrillator in situ    Lumax DR-T 09-20-2007 Dr.Akbary  . Depression   . Hyperlipidemia   . 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 Hypertension   . Lump or mass in breast    at the 7 o'clock position of right breast(non-tender)  . Other abnormal glucose   . Other left bundle branch block   . Other primary cardiomyopathies   . Thyroid cancer ([place])   . Thyroid disease   . Unspecified vitamin D deficiency   . Wolff-Parkinson-White syndrome     Medications:  Medications Prior to Admission  Medication Sig Dispense Refill Last Dose  . albuterol (PROAIR HFA) 108 (90 Base) MCG/ACT inhaler Inhale 1 puff into the lungs every 4 (four) hours as needed.     . ALPRAZolam (XANAX) 1 MG tablet Take 1 mg by mouth daily.   08/30/2017 at 2200  . amiodarone (PACERONE) 200 MG tablet Take 200 mg by mouth every other day.    08/30/2017 at 0700  . budesonide-formoterol  (SYMBICORT) 160-4.5 MCG/ACT inhaler Inhale 2 puffs into the lungs 2 (two) times daily.   Past Week at Unknown time  . carvedilol (COREG) 25 MG tablet Take 12.5-25 mg by mouth 2 (two) times daily with a meal. Take 12.5mg  in the morning and 25mg  in the evening   08/30/2017 at 2200  . clopidogrel (PLAVIX) 75 MG tablet Take 75 mg by mouth daily.    08/30/2017 at 2200  . fenofibrate 160 MG tablet Take 160 mg by mouth daily.   08/30/2017 at 2200  . FLUoxetine (PROZAC) 20 MG capsule Take 60 mg by mouth daily.    08/30/2017 at 2200  . furosemide (LASIX) 40 MG tablet Take 40 mg by mouth daily as needed.   08/29/2017 at Unknown time  . levothyroxine (SYNTHROID, LEVOTHROID) 200 MCG tablet Take 200 mcg by mouth daily.    08/30/2017 at 0700  . lisinopril (PRINIVIL,ZESTRIL) 40 MG tablet Take 40 mg by mouth daily.   08/30/2017 at 2200  . oxyCODONE-acetaminophen (PERCOCET) 7.5-325 MG per tablet Take 1 tablet by mouth every 6 (six) hours as needed.   Past Week at Unknown time  . potassium chloride SA (K-DUR,KLOR-CON) 20 MEQ tablet Take 20 mEq  by mouth daily as needed.    08/30/2017 at 2200  . simvastatin (ZOCOR) 40 MG tablet Take 40 mg by mouth every evening.   08/30/2017 at 2200  . warfarin (COUMADIN) 2 MG tablet Take 2 mg by mouth as directed. 1 tablet daily except 1/2 tablet on Sunday & Wednesday    08/30/2017 at 2200    Assessment: Pharmacy consulted to dose heparin for patient with mechanical mitral valve. Patient is also taking coumadin. Patient came in after falling at home with INR elevated >10. Vitamin K 5 mg IV was given and now INR subtherapeutic for past two days. Per anti-coag notes, patient takes 17.5 mg/week of coumadin.  Goal of Therapy:  INR 2.5-3.5 Heparin level 0.3-0.7 units/ml Monitor platelets by anticoagulation protocol: Yes   Plan:  Warfarin 3 mg x 1 dose Start heparin infusion at 1250  units/hr Check anti-Xa level in 6 hours and daily while on heparin Continue to monitor H&H and platelets   Monitor INR daily and s/s of bleeding.  7/31@1700  HL 0.2, Subtherapeutic. Will bolus 15 units/kg (1200units) and increase rate from 1250units/hr to 1450 units/hr. Will recheck heparin level in 6 hours per protocol and continue to watch for signs of bleeding.   8/1@0100  HL 0.24, Subtherapeutic. Will \\ncrease  rate from heparin iv 1450units/hr to 1600 units/hr. Will recheck heparin level in 6 hours per protocol and continue to watch for signs of bleeding.  Kimmy Parish 09/03/2017,12:59 AM

## 2017-09-03 NOTE — Progress Notes (Addendum)
PROGRESS NOTE    Ana Martin  OIN:[id number] DOB: 1955-12-19 DOA: 08/30/2017 PCP: Kristopher Glee., MD    Brief Narrative:  62 year old female with a history of WPW syndrome, AICD, mechanical mitral valve, on chronic anticoagulation, presents to the emergency room after having a fall.  Patient was noted to be somnolent.  She is chronically on benzodiazepines.  Urine drug screen positive for benzodiazepines and amphetamines.  She was admitted for further management.   Assessment & Plan:   Active Problems:   Elevated INR   Syncope   Anticoagulant overdosage   Wolff-Parkinson-White syndrome   COPD (chronic obstructive pulmonary disease) (HCC)   Pleuritic chest pain   Former smoker   1. Intentional overdose of benzodiazepines.  Urine tox screen was positive for amphetamines and benzodiazepines.  Patient reports that she had taken extra Xanax pills due to her social stressors in an attempt to end her life.  She does admit to being very depressed.  She denies any suicidal ideations at this time.  Seen by behavioral health who recommended inpatient treatment. 2. Acute toxic encephalopathy.  Related to overdose of benzodiazepines.  Patient was extremely somnolent and lethargic on arrival.  Mental status has now returned to baseline and she is awake and coherent. 3. Fall.  Related to overdose of benzodiazepines.  Extensive bruising noted over trunk and extremities related to trauma or fall in the setting of markedly elevated INR.  CT imaging did not show any deep tissue injury or bony injury. 4. Coagulopathy/elevated INR.  Related to Coumadin.  She received vitamin K and FFP.  Coumadin was reversed, but is not trending back up with treatment.  Pharmacy is monitoring. 5. History of mitral mechanical heart valve.  She is anticoagulated with Coumadin.  INR is now subtherapeutic, but slowly trending back up.  She is also on Lovenox bridge, which will need to be discontinued once INR is in goal  range of 2.5-3.5. 6. Hypertension.  Continue on Coreg.  Blood pressure currently stable.  Lisinopril has been held. 7. Hypothyroidism.  Continue on Synthroid 8. Acute respiratory failure with hypoxia.  Initially requiring 4 L of oxygen.  Initial chest x-ray/chest CT did not show any acute findings.  She does have a history of COPD.  Currently on bronchodilators.  Weaning off oxygen as tolerated. 9. COPD with exacerbation.  Continue on bronchodilators.  Breathing appears to be improving.  Transition Solu-Medrol to prednisone 10. Cardiomyopathy.  Echo shows ejection fraction of 30 to 35%.  Patient reports that this may be a chronic finding.  She follows with a cardiologist in [facility].  Records have been requested.  She is requesting further follow-up with [facility] cardiology.  We will make these arrangements prior to discharge. 11. History of atrial fibrillation.  She is anticoagulated with Coumadin.  She is status post AICD and EKG shows atrial paced rhythm. 12. Bilateral pulmonary nodules.  Seen on chest CT.  Will need follow-up CT chest in 3 to 6 months.   DVT prophylaxis: Coumadin/Lovenox bridge Code Status: Full code Family Communication: No family present today Disposition Plan: Seen by behavioral health service and recommendations are for inpatient psychiatry treatment on discharge.  Social work is assisting with placement.  Patient is currently on Coumadin with Lovenox bridge.  She has administered Lovenox injections in the past and is comfortable with this.  At this point, she would be medically stable for discharge.  Lovenox has been discontinued once INR is 2.5-3.5   Consultants:  Behavioral health  Procedures:  Echo:- Left ventricle: The cavity size was normal. Wall thickness was   normal. Systolic function was moderately to severely reduced. The   estimated ejection fraction was in the range of 30% to 35%.   Cannot evaluate diastolic function in setting of  MVR. - Aortic valve: Valve area (VTI): 1.54 cm^2. - Mitral valve: There is a mechanical MV in the MV position,   unknown type and size. There was no evidence for stenosis. There   was no significant regurgitation. Mean gradient (D): 5 mm Hg. - Left atrium: The atrium was mildly dilated. - Right ventricle: The cavity size was mildly dilated. - Right atrium: The atrium was mildly dilated.  - Technically difficult study.  EGB:TDVV is a normal drowsy and asleep electroencephalogram.  No epileptiform activity is noted.  Antimicrobials:      Subjective: Continues to have diffuse body pain that is worse with any kind of movement.  Denies any shortness of breath.  Objective: Vitals:   09/03/17 0556 09/03/17 0700 09/03/17 1425 09/03/17 1512  BP: (!) 106/54  110/63   Pulse: 70  70   Resp:   18   Temp: (!) 97.5 F (36.4 C)  97.7 F (36.5 C)   TempSrc: Oral  Oral   SpO2: 97% 94% 93% 93%  Weight:      Height:        Intake/Output Summary (Last 24 hours) at 09/03/2017 1731 Last data filed at 09/03/2017 1100 Gross per 24 hour  Intake 706.73 ml  Output -  Net 706.73 ml   Filed Weights   08/30/17 2206 08/31/17 0301  Weight: 90.7 kg (200 lb) 92.5 kg (204 lb)    Examination:  General exam: Alert, awake, oriented x 3 Respiratory system: Very mild wheeze bilaterally. Respiratory effort normal. Cardiovascular system:RRR. No murmurs, rubs, gallops. Gastrointestinal system: Abdomen is nondistended, soft and nontender. No organomegaly or masses felt. Normal bowel sounds heard. Central nervous system: Alert and oriented. No focal neurological deficits. Extremities: No C/C/E, +pedal pulses Skin: Extensive bruising noted over her limbs and trunk Psychiatry: Appears depressed and withdrawn.   Data Reviewed: I have personally reviewed following labs and imaging studies  CBC: Recent Labs  Lab 08/30/17 2245 08/31/17 0605 09/01/17 0608 09/02/17 0501 09/03/17 0739  WBC 5.4 5.3 6.4 6.0  10.4  HGB 11.1* 10.0* 9.8* 9.6* 9.7*  HCT 36.0 32.9* 32.0* 32.1* 32.3*  MCV 89.8 89.9 89.6 90.4 89.7  PLT 166 155 160 153 616   Basic Metabolic Panel: Recent Labs  Lab 08/30/17 2245 08/31/17 0605 09/01/17 0608 09/02/17 0501 09/03/17 0739  NA 138 138 137 138 138  K 4.1 3.7 4.1 4.6 4.7  CL 101 102 101 100 100  CO2 29 30 29 31  32  GLUCOSE 82 80 101* 199* 154*  BUN 11 10 13 20  27*  CREATININE 0.84 0.79 0.86 0.98 0.89  CALCIUM 9.0 8.8* 8.7* 8.9 9.1  MG  --   --  1.7  --   --    GFR: Estimated Creatinine Clearance: 75.6 mL/min (by C-G formula based on SCr of 0.89 mg/dL). Liver Function Tests: Recent Labs  Lab 08/30/17 2245 08/31/17 0605 09/01/17 0608 09/02/17 0501 09/03/17 0739  AST 51* 42* 32 30 25  ALT 22 19 17 17 15   ALKPHOS 27* 29* 31* 31* 33*  BILITOT 1.2 1.1 1.2 1.1 1.1  PROT 7.2 6.8 7.1 7.3 7.3  ALBUMIN 3.8 3.7 3.8 3.8 3.8   No results for input(s):  LIPASE, AMYLASE in the last 168 hours. No results for input(s): AMMONIA in the last 168 hours. Coagulation Profile: Recent Labs  Lab 08/30/17 2245 08/31/17 0605 09/01/17 0608 09/02/17 0501 09/03/17 0739  INR >10.00* 3.82 2.03 1.64 1.89   Cardiac Enzymes: No results for input(s): CKTOTAL, CKMB, CKMBINDEX, TROPONINI in the last 168 hours. BNP (last 3 results) No results for input(s): PROBNP in the last 8760 hours. HbA1C: No results for input(s): HGBA1C in the last 72 hours. CBG: No results for input(s): GLUCAP in the last 168 hours. Lipid Profile: No results for input(s): CHOL, HDL, LDLCALC, TRIG, CHOLHDL, LDLDIRECT in the last 72 hours. Thyroid Function Tests: No results for input(s): TSH, T4TOTAL, FREET4, T3FREE, THYROIDAB in the last 72 hours. Anemia Panel: No results for input(s): VITAMINB12, FOLATE, FERRITIN, TIBC, IRON, RETICCTPCT in the last 72 hours. Sepsis Labs: No results for input(s): PROCALCITON, LATICACIDVEN in the last 168 hours.  No results found for this or any previous visit (from the  past 240 hour(s)).       Radiology Studies: No results found.      Scheduled Meds: . amiodarone  200 mg Oral Daily  . atorvastatin  20 mg Oral q1800  . carvedilol  12.5 mg Oral Q breakfast  . carvedilol  25 mg Oral Q supper  . clopidogrel  75 mg Oral Daily  . enoxaparin (LOVENOX) injection  90 mg Subcutaneous Q12H  . fenofibrate  160 mg Oral Daily  . FLUoxetine  60 mg Oral QHS  . furosemide  40 mg Oral Daily  . guaiFENesin  1,200 mg Oral BID  . ipratropium-albuterol  3 mL Nebulization TID  . levothyroxine  200 mcg Oral QAC breakfast  . lisinopril  20 mg Oral Daily  . mouth rinse  15 mL Mouth Rinse BID  . methylPREDNISolone (SOLU-MEDROL) injection  60 mg Intravenous Q12H  . mometasone-formoterol  2 puff Inhalation BID  . potassium chloride SA  20 mEq Oral Daily  . sodium chloride flush  3 mL Intravenous Q12H  . Warfarin - Pharmacist Dosing Inpatient   Does not apply q1800   Continuous Infusions: . sodium chloride       LOS: 3 days    Time spent: 25 minutes    Kathie Dike, MD [facility] Pager [phone]  If 7PM-7AM, please contact night-coverage [email or web address] Password [facility] 09/03/2017, 5:31 PM

## 2017-09-03 NOTE — Progress Notes (Signed)
ANTICOAGULATION CONSULT NOTE - follow up [facility] for heparin to lovenox Indication: atrial fibrillation - mechanical mitral valve  Allergies  Allergen Reactions  . Codeine     Patient Measurements: Height: 5\' 7"  (170.2 cm) Weight: 204 lb (92.5 kg) IBW/kg (Calculated) : 61.6 Heparin Dosing Weight: 81.7 kg  Vital Signs: Temp: 97.5 F (36.4 C) (08/01 0556) Temp Source: Oral (08/01 0556) BP: 106/54 (08/01 0556) Pulse Rate: 70 (08/01 0556)  Labs: Recent Labs    09/01/17 0608 09/02/17 0501 09/02/17 1618 09/02/17 2311 09/03/17 0739  HGB 9.8* 9.6*  --   --  9.7*  HCT 32.0* 32.1*  --   --  32.3*  PLT 160 153  --   --  158  LABPROT 22.8* 19.3*  --   --  21.6*  INR 2.03 1.64  --   --  1.89  HEPARINUNFRC  --   --  0.20* 0.24* 0.67  CREATININE 0.86 0.98  --   --  0.89    Estimated Creatinine Clearance: 75.6 mL/min (by C-G formula based on SCr of 0.89 mg/dL).   Medical History: Past Medical History:  Diagnosis Date  . Anxiety   . Atrial fibrillation ([place])   . Automatic implantable cardiac defibrillator in situ    Lumax DR-T 09-20-2007 Dr.Akbary  . Depression   . Hyperlipidemia   . 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 Hypertension   . Lump or mass in breast    at the 7 o'clock position of right breast(non-tender)  . Other abnormal glucose   . Other left bundle branch block   . Other primary cardiomyopathies   . Thyroid cancer ([place])   . Thyroid disease   . Unspecified vitamin D deficiency   . Wolff-Parkinson-White syndrome     Medications:  Medications Prior to Admission  Medication Sig Dispense Refill Last Dose  . albuterol (PROAIR HFA) 108 (90 Base) MCG/ACT inhaler Inhale 1 puff into the lungs every 4 (four) hours as needed.     . ALPRAZolam (XANAX) 1 MG tablet Take 1 mg by mouth daily.   08/30/2017 at 2200  . amiodarone (PACERONE) 200 MG tablet Take 200 mg by mouth every other day.    08/30/2017 at 0700  .  budesonide-formoterol (SYMBICORT) 160-4.5 MCG/ACT inhaler Inhale 2 puffs into the lungs 2 (two) times daily.   Past Week at Unknown time  . carvedilol (COREG) 25 MG tablet Take 12.5-25 mg by mouth 2 (two) times daily with a meal. Take 12.5mg  in the morning and 25mg  in the evening   08/30/2017 at 2200  . clopidogrel (PLAVIX) 75 MG tablet Take 75 mg by mouth daily.    08/30/2017 at 2200  . fenofibrate 160 MG tablet Take 160 mg by mouth daily.   08/30/2017 at 2200  . FLUoxetine (PROZAC) 20 MG capsule Take 60 mg by mouth daily.    08/30/2017 at 2200  . furosemide (LASIX) 40 MG tablet Take 40 mg by mouth daily as needed.   08/29/2017 at Unknown time  . levothyroxine (SYNTHROID, LEVOTHROID) 200 MCG tablet Take 200 mcg by mouth daily.    08/30/2017 at 0700  . lisinopril (PRINIVIL,ZESTRIL) 40 MG tablet Take 40 mg by mouth daily.   08/30/2017 at 2200  . oxyCODONE-acetaminophen (PERCOCET) 7.5-325 MG per tablet Take 1 tablet by mouth every 6 (six) hours as needed.   Past Week at Unknown time  . potassium chloride SA (K-DUR,KLOR-CON) 20 MEQ tablet Take 20  mEq by mouth daily as needed.    08/30/2017 at 2200  . simvastatin (ZOCOR) 40 MG tablet Take 40 mg by mouth every evening.   08/30/2017 at 2200  . warfarin (COUMADIN) 2 MG tablet Take 2 mg by mouth as directed. 1 tablet daily except 1/2 tablet on Sunday & Wednesday    08/30/2017 at 2200    Assessment: Pharmacy consulted to dose heparin for patient with mechanical mitral valve. Patient is also taking coumadin. Patient came in after falling at home with INR elevated >10. Vitamin K 5 mg IV was given and now INR subtherapeutic but trending upward.  Per anti-coag notes, patient takes 17.5 mg/week of coumadin. Patient will be slow to return to therapeutic range with Vitamin K administration. Transition heparin to lovenox. Bridge with lovenox until INR therapeutic    Goal of Therapy:  INR 2.5-3.5 Monitor platelets by anticoagulation protocol: Yes   Plan:  Warfarin 3 mg x  1 dose lovenox 1mg /kg (90mg ) sq q12h Continue to monitor H&H and platelets Monitor PT-INR daily and  s/s of bleeding.  Isac Sarna, BS Pharm D, BCPS Clinical Pharmacist Pager [phone] 09/03/2017,10:28 AM

## 2017-09-03 NOTE — Progress Notes (Addendum)
Physical Therapy Treatment Patient Details Name: Ana Martin MRN: [id number] DOB: December 07, 1955 Today's Date: 09/03/2017    History of Present Illness Paul Torpey  is a 62 y.o. female, with history of atrial fibrillation, WPW syndrome, AICD, hypertension on chronic anticoagulation with Coumadin, mechanical heart valve, hypertension, thyroid cancer, anxiety who came to ED after patient fell at home.  Patient says that she fell into her closet and complains of pain in the arm chest groin.  She denies passing out but does not remember falling.  Patient says that she woke up on the floor.    PT Comments    Patient demonstrates much improvement for gait training, transfers and sit to stands without loss of balance, has to frequently lean on side rails in hallway during ambulation for support and did not require use of RW.  Patient will benefit from continued physical therapy in hospital and recommended venue below to increase strength, balance, endurance for safe ADLs and gait.    Follow Up Recommendations  Home health PT;Supervision for mobility/OOB     Equipment Recommendations       Recommendations for Other Services       Precautions / Restrictions Precautions Precautions: Fall Restrictions Weight Bearing Restrictions: No    Mobility  Bed Mobility Overal bed mobility: Modified Independent             General bed mobility comments: increased time  Transfers Overall transfer level: Needs assistance Equipment used: None Transfers: Sit to/from Stand;Stand Pivot Transfers Sit to Stand: Supervision Stand pivot transfers: Supervision       General transfer comment: slow slightly labored movement  Ambulation/Gait Ambulation/Gait assistance: Supervision Gait Distance (Feet): 150 Feet Assistive device: None Gait Pattern/deviations: Decreased step length - right;Decreased step length - left;Decreased stride length Gait velocity: slow   General Gait Details:  demonstrates slow slightly labored cadence with frequent use of side rails in hallway for support, no loss of balance   Stairs             Wheelchair Mobility    Modified Rankin (Stroke Patients Only)       Balance Overall balance assessment: Mild deficits observed, not formally tested                                          Cognition Arousal/Alertness: Awake/alert Behavior During Therapy: WFL for tasks assessed/performed Overall Cognitive Status: Within Functional Limits for tasks assessed                                        Exercises General Exercises - Lower Extremity Long Arc Quad: Seated;AROM;Strengthening;Both;10 reps Hip Flexion/Marching: Seated;AROM;Strengthening;Both;10 reps Toe Raises: Seated;AROM;Strengthening;Both;10 reps Heel Raises: Seated;AROM;Strengthening;Both;10 reps    General Comments        Pertinent Vitals/Pain Pain Assessment: No/denies pain    Home Living                      Prior Function            PT Goals (current goals can now be found in the care plan section) Acute Rehab PT Goals Patient Stated Goal: possibly move in with her sister for assistance PT Goal Formulation: With patient Time For Goal Achievement: 09/17/17 Potential to Achieve Goals: Good Progress towards PT goals:  Progressing toward goals    Frequency    Min 3X/week      PT Plan Current plan remains appropriate    Co-evaluation              AM-PAC PT "6 Clicks" Daily Activity  Outcome Measure  Difficulty turning over in bed (including adjusting bedclothes, sheets and blankets)?: None Difficulty moving from lying on back to sitting on the side of the bed? : None Difficulty sitting down on and standing up from a chair with arms (e.g., wheelchair, bedside commode, etc,.)?: None Help needed moving to and from a bed to chair (including a wheelchair)?: A Little Help needed walking in hospital room?: A  Little Help needed climbing 3-5 steps with a railing? : A Little 6 Click Score: 21    End of Session   Activity Tolerance: Patient tolerated treatment well;Patient limited by fatigue Patient left: in bed;with call bell/phone within reach Nurse Communication: Mobility status PT Visit Diagnosis: Unsteadiness on feet (R26.81);Other abnormalities of gait and mobility (R26.89);Muscle weakness (generalized) (M62.81)     Time: 2956-2130 PT Time Calculation (min) (ACUTE ONLY): 25 min  Charges:  $Gait Training: 8-22 mins $Therapeutic Exercise: 8-22 mins                     4:04 PM, 09/03/17 [name], MPT Physical Therapist with [facility] [phone] office [phone] mobile phone

## 2017-09-04 ENCOUNTER — Encounter (HOSPITAL_COMMUNITY): Payer: Self-pay | Admitting: *Deleted

## 2017-09-04 ENCOUNTER — Other Ambulatory Visit: Payer: Self-pay

## 2017-09-04 ENCOUNTER — Inpatient Hospital Stay (HOSPITAL_COMMUNITY)
Admission: AD | Admit: 2017-09-04 | Discharge: 2017-09-10 | DRG: 885 | Disposition: A | Discharge status: Home / Self Care | Payer: PPO | Source: Intra-hospital | Attending: Psychiatry | Admitting: Psychiatry

## 2017-09-04 DIAGNOSIS — Z85850 Personal history of malignant neoplasm of thyroid: Secondary | ICD-10-CM

## 2017-09-04 DIAGNOSIS — I428 Other cardiomyopathies: Secondary | ICD-10-CM | POA: Diagnosis not present

## 2017-09-04 DIAGNOSIS — W1830XA Fall on same level, unspecified, initial encounter: Secondary | ICD-10-CM | POA: Diagnosis not present

## 2017-09-04 DIAGNOSIS — Y92009 Unspecified place in unspecified non-institutional (private) residence as the place of occurrence of the external cause: Secondary | ICD-10-CM | POA: Diagnosis not present

## 2017-09-04 DIAGNOSIS — Z23 Encounter for immunization: Secondary | ICD-10-CM | POA: Diagnosis not present

## 2017-09-04 DIAGNOSIS — Z915 Personal history of self-harm: Secondary | ICD-10-CM | POA: Diagnosis not present

## 2017-09-04 DIAGNOSIS — Z7901 Long term (current) use of anticoagulants: Secondary | ICD-10-CM | POA: Diagnosis not present

## 2017-09-04 DIAGNOSIS — Z952 Presence of prosthetic heart valve: Secondary | ICD-10-CM | POA: Diagnosis not present

## 2017-09-04 DIAGNOSIS — T424X2D Poisoning by benzodiazepines, intentional self-harm, subsequent encounter: Secondary | ICD-10-CM

## 2017-09-04 DIAGNOSIS — I4891 Unspecified atrial fibrillation: Secondary | ICD-10-CM | POA: Diagnosis not present

## 2017-09-04 DIAGNOSIS — T45511A Poisoning by anticoagulants, accidental (unintentional), initial encounter: Secondary | ICD-10-CM | POA: Diagnosis not present

## 2017-09-04 DIAGNOSIS — G92 Toxic encephalopathy: Secondary | ICD-10-CM

## 2017-09-04 DIAGNOSIS — F332 Major depressive disorder, recurrent severe without psychotic features: Principal | ICD-10-CM | POA: Diagnosis present

## 2017-09-04 DIAGNOSIS — Z87891 Personal history of nicotine dependence: Secondary | ICD-10-CM | POA: Diagnosis not present

## 2017-09-04 DIAGNOSIS — E039 Hypothyroidism, unspecified: Secondary | ICD-10-CM | POA: Diagnosis present

## 2017-09-04 DIAGNOSIS — Z801 Family history of malignant neoplasm of trachea, bronchus and lung: Secondary | ICD-10-CM

## 2017-09-04 DIAGNOSIS — Z6379 Other stressful life events affecting family and household: Secondary | ICD-10-CM | POA: Diagnosis not present

## 2017-09-04 DIAGNOSIS — T424X4A Poisoning by benzodiazepines, undetermined, initial encounter: Secondary | ICD-10-CM | POA: Diagnosis not present

## 2017-09-04 DIAGNOSIS — E785 Hyperlipidemia, unspecified: Secondary | ICD-10-CM | POA: Diagnosis present

## 2017-09-04 DIAGNOSIS — T424X2A Poisoning by benzodiazepines, intentional self-harm, initial encounter: Secondary | ICD-10-CM | POA: Diagnosis not present

## 2017-09-04 DIAGNOSIS — T1491XA Suicide attempt, initial encounter: Secondary | ICD-10-CM | POA: Diagnosis not present

## 2017-09-04 DIAGNOSIS — G4700 Insomnia, unspecified: Secondary | ICD-10-CM | POA: Diagnosis not present

## 2017-09-04 DIAGNOSIS — I10 Essential (primary) hypertension: Secondary | ICD-10-CM | POA: Diagnosis present

## 2017-09-04 DIAGNOSIS — I456 Pre-excitation syndrome: Secondary | ICD-10-CM | POA: Diagnosis present

## 2017-09-04 DIAGNOSIS — Z90710 Acquired absence of both cervix and uterus: Secondary | ICD-10-CM | POA: Diagnosis not present

## 2017-09-04 DIAGNOSIS — I509 Heart failure, unspecified: Secondary | ICD-10-CM | POA: Diagnosis not present

## 2017-09-04 DIAGNOSIS — J441 Chronic obstructive pulmonary disease with (acute) exacerbation: Secondary | ICD-10-CM | POA: Diagnosis not present

## 2017-09-04 DIAGNOSIS — R791 Abnormal coagulation profile: Secondary | ICD-10-CM | POA: Diagnosis not present

## 2017-09-04 DIAGNOSIS — Z95810 Presence of automatic (implantable) cardiac defibrillator: Secondary | ICD-10-CM

## 2017-09-04 LAB — PROTIME-INR
INR: 2.25
Prothrombin Time: 24.7 seconds — ABNORMAL HIGH (ref 11.4–15.2)

## 2017-09-04 LAB — CBC
HEMATOCRIT: 31.8 % — AB (ref 36.0–46.0)
HEMOGLOBIN: 9.5 g/dL — AB (ref 12.0–15.0)
MCH: 26.9 pg (ref 26.0–34.0)
MCHC: 29.9 g/dL — ABNORMAL LOW (ref 30.0–36.0)
MCV: 90.1 fL (ref 78.0–100.0)
Platelets: 178 10*3/uL (ref 150–400)
RBC: 3.53 MIL/uL — AB (ref 3.87–5.11)
RDW: 15.6 % — ABNORMAL HIGH (ref 11.5–15.5)
WBC: 8.9 10*3/uL (ref 4.0–10.5)

## 2017-09-04 LAB — COMPREHENSIVE METABOLIC PANEL
ALK PHOS: 34 U/L — AB (ref 38–126)
ALT: 18 U/L (ref 0–44)
AST: 26 U/L (ref 15–41)
Albumin: 3.7 g/dL (ref 3.5–5.0)
Anion gap: 8 (ref 5–15)
BUN: 37 mg/dL — ABNORMAL HIGH (ref 8–23)
CALCIUM: 8.8 mg/dL — AB (ref 8.9–10.3)
CHLORIDE: 99 mmol/L (ref 98–111)
CO2: 29 mmol/L (ref 22–32)
CREATININE: 1.02 mg/dL — AB (ref 0.44–1.00)
GFR, EST NON AFRICAN AMERICAN: 57 mL/min — AB (ref >60)
Glucose, Bld: 203 mg/dL — ABNORMAL HIGH (ref 70–99)
Potassium: 4.6 mmol/L (ref 3.5–5.1)
Sodium: 136 mmol/L (ref 135–145)
Total Bilirubin: 0.9 mg/dL (ref 0.3–1.2)
Total Protein: 6.8 g/dL (ref 6.5–8.1)

## 2017-09-04 MED ORDER — PREDNISONE 20 MG PO TABS
40.0000 mg | ORAL_TABLET | Freq: Every day | ORAL | Status: AC
  Administered 2017-09-05 – 2017-09-06 (×2): 40 mg via ORAL
  Filled 2017-09-04 (×2): qty 2

## 2017-09-04 MED ORDER — IPRATROPIUM-ALBUTEROL 0.5-2.5 (3) MG/3ML IN SOLN
3.0000 mL | Freq: Three times a day (TID) | RESPIRATORY_TRACT | Status: DC
  Filled 2017-09-04 (×22): qty 3

## 2017-09-04 MED ORDER — CARVEDILOL 25 MG PO TABS
25.0000 mg | ORAL_TABLET | Freq: Every day | ORAL | Status: DC
  Administered 2017-09-07 – 2017-09-09 (×3): 25 mg via ORAL
  Filled 2017-09-04 (×7): qty 1

## 2017-09-04 MED ORDER — WARFARIN - PHYSICIAN DOSING INPATIENT
Freq: Every day | Status: DC
  Filled 2017-09-04 (×9): qty 1

## 2017-09-04 MED ORDER — FLUOXETINE HCL 20 MG PO CAPS
60.0000 mg | ORAL_CAPSULE | Freq: Every day | ORAL | Status: DC
  Administered 2017-09-04: 60 mg via ORAL
  Filled 2017-09-04 (×3): qty 3

## 2017-09-04 MED ORDER — MAGNESIUM HYDROXIDE 400 MG/5ML PO SUSP: 30.0000 mL | Freq: Every day | ORAL | Status: DC | PRN

## 2017-09-04 MED ORDER — ATORVASTATIN CALCIUM 20 MG PO TABS: 20.0000 mg | ORAL_TABLET | Freq: Every day | ORAL | Status: DC

## 2017-09-04 MED ORDER — LISINOPRIL 20 MG PO TABS
40.0000 mg | ORAL_TABLET | Freq: Every day | ORAL | Status: DC
  Administered 2017-09-05 – 2017-09-10 (×5): 40 mg via ORAL
  Filled 2017-09-04: qty 1
  Filled 2017-09-04 (×2): qty 2
  Filled 2017-09-04: qty 1
  Filled 2017-09-04 (×5): qty 2

## 2017-09-04 MED ORDER — CLOPIDOGREL BISULFATE 75 MG PO TABS
75.0000 mg | ORAL_TABLET | Freq: Every day | ORAL | Status: DC
Start: 2017-09-05 — End: 2017-09-10
  Administered 2017-09-05 – 2017-09-10 (×6): 75 mg via ORAL
  Filled 2017-09-04 (×8): qty 1

## 2017-09-04 MED ORDER — PREDNISONE 10 MG PO TABS: 10.0000 mg | ORAL_TABLET | Freq: Every day | ORAL | Status: DC

## 2017-09-04 MED ORDER — ENOXAPARIN SODIUM 100 MG/ML ~~LOC~~ SOLN: 90.0000 mg | Freq: Two times a day (BID) | SUBCUTANEOUS | Status: DC

## 2017-09-04 MED ORDER — HYDROXYZINE HCL 25 MG PO TABS
25.0000 mg | ORAL_TABLET | Freq: Three times a day (TID) | ORAL | Status: DC | PRN
  Administered 2017-09-04 – 2017-09-09 (×6): 25 mg via ORAL
  Filled 2017-09-04 (×6): qty 1

## 2017-09-04 MED ORDER — ATORVASTATIN CALCIUM 20 MG PO TABS
20.0000 mg | ORAL_TABLET | Freq: Every day | ORAL | Status: DC
Start: 2017-09-05 — End: 2017-09-10
  Administered 2017-09-05 – 2017-09-09 (×5): 20 mg via ORAL
  Filled 2017-09-04 (×7): qty 1

## 2017-09-04 MED ORDER — LEVOTHYROXINE SODIUM 100 MCG PO TABS
200.0000 ug | ORAL_TABLET | Freq: Every day | ORAL | Status: DC
  Administered 2017-09-05 – 2017-09-10 (×6): 200 ug via ORAL
  Filled 2017-09-04: qty 1
  Filled 2017-09-04 (×6): qty 2
  Filled 2017-09-04: qty 1
  Filled 2017-09-04 (×2): qty 2

## 2017-09-04 MED ORDER — WARFARIN SODIUM 2.5 MG PO TABS
2.5000 mg | ORAL_TABLET | Freq: Once | ORAL | Status: AC
  Administered 2017-09-04: 2.5 mg via ORAL
  Filled 2017-09-04: qty 1

## 2017-09-04 MED ORDER — ENOXAPARIN SODIUM 100 MG/ML ~~LOC~~ SOLN
90.0000 mg | Freq: Two times a day (BID) | SUBCUTANEOUS | Status: DC
  Filled 2017-09-04 (×5): qty 1

## 2017-09-04 MED ORDER — IPRATROPIUM-ALBUTEROL 0.5-2.5 (3) MG/3ML IN SOLN: 3.0000 mL | Freq: Three times a day (TID) | RESPIRATORY_TRACT | Status: DC

## 2017-09-04 MED ORDER — POTASSIUM CHLORIDE CRYS ER 20 MEQ PO TBCR
20.0000 meq | EXTENDED_RELEASE_TABLET | Freq: Two times a day (BID) | ORAL | Status: AC
  Administered 2017-09-05 – 2017-09-06 (×3): 20 meq via ORAL
  Filled 2017-09-04 (×4): qty 1

## 2017-09-04 MED ORDER — PREDNISONE 20 MG PO TABS
30.0000 mg | ORAL_TABLET | Freq: Every day | ORAL | Status: DC
  Administered 2017-09-07: 30 mg via ORAL
  Filled 2017-09-04 (×2): qty 1

## 2017-09-04 MED ORDER — ALUM & MAG HYDROXIDE-SIMETH 200-200-20 MG/5ML PO SUSP: 30.0000 mL | ORAL | Status: DC | PRN

## 2017-09-04 MED ORDER — WARFARIN SODIUM 1 MG PO TABS
1.0000 mg | ORAL_TABLET | ORAL | Status: DC
  Filled 2017-09-04: qty 1

## 2017-09-04 MED ORDER — GUAIFENESIN ER 600 MG PO TB12
1200.0000 mg | ORAL_TABLET | Freq: Two times a day (BID) | ORAL | Status: DC
Start: 2017-09-04 — End: 2017-09-10
  Administered 2017-09-05 – 2017-09-10 (×11): 1200 mg via ORAL
  Filled 2017-09-04 (×16): qty 2

## 2017-09-04 MED ORDER — FLUTICASONE PROPIONATE HFA 44 MCG/ACT IN AERO
2.0000 | INHALATION_SPRAY | Freq: Two times a day (BID) | RESPIRATORY_TRACT | Status: DC
  Administered 2017-09-05 – 2017-09-10 (×10): 2 via RESPIRATORY_TRACT
  Filled 2017-09-04: qty 10.6

## 2017-09-04 MED ORDER — PREDNISONE 10 MG PO TABS: ORAL_TABLET | ORAL | 0 refills | Status: DC

## 2017-09-04 MED ORDER — ALPRAZOLAM 1 MG PO TABS: 0.5000 mg | ORAL_TABLET | Freq: Every evening | ORAL | 0 refills | Status: DC | PRN

## 2017-09-04 MED ORDER — FENOFIBRATE 160 MG PO TABS
160.0000 mg | ORAL_TABLET | Freq: Every day | ORAL | Status: DC
Start: 2017-09-05 — End: 2017-09-10
  Administered 2017-09-05 – 2017-09-10 (×6): 160 mg via ORAL
  Filled 2017-09-04 (×8): qty 1

## 2017-09-04 MED ORDER — LISINOPRIL 20 MG PO TABS
20.0000 mg | ORAL_TABLET | Freq: Every day | ORAL | Status: DC
  Filled 2017-09-04: qty 1

## 2017-09-04 MED ORDER — TRAZODONE HCL 50 MG PO TABS
50.0000 mg | ORAL_TABLET | Freq: Every evening | ORAL | Status: DC | PRN
  Administered 2017-09-04 – 2017-09-06 (×3): 50 mg via ORAL
  Filled 2017-09-04 (×3): qty 1

## 2017-09-04 MED ORDER — PREDNISONE 20 MG PO TABS: 20.0000 mg | ORAL_TABLET | Freq: Every day | ORAL | Status: DC

## 2017-09-04 MED ORDER — ALBUTEROL SULFATE HFA 108 (90 BASE) MCG/ACT IN AERS: 1.0000 | INHALATION_SPRAY | Freq: Four times a day (QID) | RESPIRATORY_TRACT | Status: DC | PRN

## 2017-09-04 MED ORDER — WARFARIN SODIUM 2 MG PO TABS: 2.0000 mg | ORAL_TABLET | Freq: Every day | ORAL | Status: DC

## 2017-09-04 MED ORDER — WARFARIN SODIUM 2 MG PO TABS
2.0000 mg | ORAL_TABLET | ORAL | Status: DC
  Filled 2017-09-04: qty 1

## 2017-09-04 MED ORDER — AMIODARONE HCL 100 MG PO TABS
200.0000 mg | ORAL_TABLET | Freq: Every day | ORAL | Status: DC
  Administered 2017-09-05 – 2017-09-10 (×5): 200 mg via ORAL
  Filled 2017-09-04: qty 2
  Filled 2017-09-04: qty 1
  Filled 2017-09-04 (×7): qty 2
  Filled 2017-09-04: qty 1

## 2017-09-04 MED ORDER — FUROSEMIDE 40 MG PO TABS
40.0000 mg | ORAL_TABLET | Freq: Every day | ORAL | Status: DC
  Administered 2017-09-05 – 2017-09-10 (×6): 40 mg via ORAL
  Filled 2017-09-04 (×8): qty 1

## 2017-09-04 MED ORDER — ACETAMINOPHEN 325 MG PO TABS
650.0000 mg | ORAL_TABLET | Freq: Four times a day (QID) | ORAL | Status: DC | PRN
  Administered 2017-09-05: 650 mg via ORAL
  Filled 2017-09-04: qty 2

## 2017-09-04 MED ORDER — CARVEDILOL 12.5 MG PO TABS
12.5000 mg | ORAL_TABLET | Freq: Every day | ORAL | Status: DC
  Administered 2017-09-05 – 2017-09-10 (×5): 12.5 mg via ORAL
  Filled 2017-09-04 (×9): qty 1

## 2017-09-04 NOTE — Progress Notes (Signed)
Pt accepted to  [facility] Marvia Pickles, NP is the accepting provider.  Dr. Myles Lipps is the attending provider.  Call report to [phone]  @[facility]  ED notified.   Pt is Voluntary.  Pt may be transported by [facility]  Pt scheduled  to arrive at [facility]@9  PM  [facility] T. Romie Minus, MSW, [place] Disposition Clinical Social Work [phone] (cell) [phone] (office)
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
## 2017-09-04 NOTE — Discharge Summary (Signed)
Physician Discharge Summary  Ana Martin CNO:[id number] DOB: Dec 03, 1955 DOA: 08/30/2017  PCP: Kristopher Glee., MD  Admit date: 08/30/2017 Discharge date: 09/04/2017  Admitted From: home Disposition:  Behavioral health hospital  Recommendations for Outpatient Follow-up:  1. Patient will be transferred to behavioral health Hospital for further treatments 2. Repeat INR in a.m.  If INR is greater than 2.5, discontinue Lovenox.  Would recommend pharmacy continue to dose Coumadin  3. Monitor CBG with meals while the patient is on prednisone and dose insulin per sliding scale. 4. Patient has been referred to outpatient cardiology for follow-up of cardiomyopathy.  She will need to be set up with Coumadin clinic at [facility] after discharge to monitor her INR.  Discharge Condition: Stable CODE STATUS: Full code Diet recommendation: Heart healthy  Brief/Interim Summary: 62 year old female with a history of WPW syndrome, AICD, mechanical mitral valve, on chronic anticoagulation, presents to the emergency room after having a fall.  Patient was noted to be somnolent.  She is chronically on benzodiazepines.  Urine drug screen positive for benzodiazepines and amphetamines.  She was admitted for further management.  Discharge Diagnoses:  Active Problems:   Elevated INR   Syncope   Anticoagulant overdosage   Wolff-Parkinson-White syndrome   COPD (chronic obstructive pulmonary disease) (HCC)   Pleuritic chest pain   Former smoker  1. Intentional overdose of benzodiazepines.  Urine tox screen was positive for amphetamines and benzodiazepines.  Patient reports that she had taken extra Xanax pills due to her social stressors in an attempt to end her life.  She does admit to being very depressed.  She denies any suicidal ideations at this time.  Seen by behavioral health who recommended inpatient treatment. 2. Acute toxic encephalopathy.  Related to overdose of benzodiazepines.  Patient was extremely  somnolent and lethargic on arrival.  Mental status has now returned to baseline and she is awake and coherent. 3. Fall.  Related to overdose of benzodiazepines.  Extensive bruising noted over trunk and extremities related to trauma from fall in the setting of markedly elevated INR.  CT imaging did not show any deep tissue injury or bony injury. 4. Coagulopathy/elevated INR.  Related to Coumadin.  She received vitamin K and FFP.  Coumadin was reversed, but is trending back up with treatment.  Pharmacy is monitoring. Goal INR will be between 2.5-3.5 5. History of mechanical mitral heart valve.  She is anticoagulated with Coumadin.  INR is currently subtherapeutic, but slowly trending back up.  She is on Lovenox bridge, which will need to be discontinued once INR is in goal range of 2.5-3.5. 6. Hypertension.  Continue on Coreg and lisinopril.  Blood pressure currently stable.  7. Hypothyroidism.  Continue on Synthroid 8. Acute respiratory failure with hypoxia.  Initially requiring 4 L of oxygen.  Initial chest x-ray/chest CT did not show any acute findings.  She does have a history of COPD.  Currently on bronchodilators. Oxygen has been weaned off and she is breathing comfortably on room air. 9. COPD with exacerbation.  Continue on bronchodilators.  Breathing appears to be improving. She is on a prednisone taper 10. Cardiomyopathy.  Echo shows ejection fraction of 30 to 35%.  Patient reports that this may be a chronic finding.  She follows with a cardiologist in [facility].  She is requesting further follow-up with [facility] cardiology.  We will make these arrangements prior to discharge. 11. History of atrial fibrillation.  She is anticoagulated with Coumadin.  She  is status post AICD and EKG shows atrial paced rhythm. 12. Bilateral pulmonary nodules.  Seen on chest CT.  Will need follow-up CT chest in 3 to 6 months.   Discharge Instructions  Discharge Instructions    Diet - low sodium  heart healthy   Complete by:  As directed    Increase activity slowly   Complete by:  As directed      Allergies as of 09/04/2017      Reactions   Codeine       Medication List    STOP taking these medications   simvastatin 40 MG tablet Commonly known as:  ZOCOR     TAKE these medications   ALPRAZolam 1 MG tablet Commonly known as:  XANAX Take 0.5 tablets (0.5 mg total) by mouth at bedtime as needed for anxiety. What changed:    how much to take  when to take this  reasons to take this   atorvastatin 20 MG tablet Commonly known as:  LIPITOR Take 1 tablet (20 mg total) by mouth daily at 6 PM. Start taking on:  09/05/2017   carvedilol 25 MG tablet Commonly known as:  COREG Take 12.5-25 mg by mouth 2 (two) times daily with a meal. Take 12.5mg  in the morning and 25mg  in the evening   clopidogrel 75 MG tablet Commonly known as:  PLAVIX Take 75 mg by mouth daily.   COUMADIN 2 MG tablet Generic drug:  warfarin Take 2 mg by mouth as directed. 1 tablet daily except 1/2 tablet on Sunday & Wednesday   enoxaparin 100 MG/ML injection Commonly known as:  LOVENOX Inject 0.9 mLs (90 mg total) into the skin every 12 (twelve) hours. Start taking on:  09/05/2017   fenofibrate 160 MG tablet Take 160 mg by mouth daily.   FLUoxetine 20 MG capsule Commonly known as:  PROZAC Take 60 mg by mouth daily.   furosemide 40 MG tablet Commonly known as:  LASIX Take 40 mg by mouth daily as needed.   ipratropium-albuterol 0.5-2.5 (3) MG/3ML Soln Commonly known as:  DUONEB Take 3 mLs by nebulization 3 (three) times daily.   levothyroxine 200 MCG tablet Commonly known as:  SYNTHROID, LEVOTHROID Take 200 mcg by mouth daily.   lisinopril 40 MG tablet Commonly known as:  PRINIVIL,ZESTRIL Take 40 mg by mouth daily.   oxyCODONE-acetaminophen 7.5-325 MG tablet Commonly known as:  PERCOCET Take 1 tablet by mouth every 6 (six) hours as needed.   PACERONE 200 MG tablet Generic drug:   amiodarone Take 200 mg by mouth every other day.   potassium chloride SA 20 MEQ tablet Commonly known as:  K-DUR,KLOR-CON Take 20 mEq by mouth daily as needed.   predniSONE 10 MG tablet Commonly known as:  DELTASONE Take 40mg  po daily for 2 days then 30mg  daily for 2 days then 20mg  daily for 2 days then 10mg  daily for 2 days then stop   PROAIR HFA 108 (90 Base) MCG/ACT inhaler Generic drug:  albuterol Inhale 1 puff into the lungs every 4 (four) hours as needed.   SYMBICORT 160-4.5 MCG/ACT inhaler Generic drug:  budesonide-formoterol Inhale 2 puffs into the lungs 2 (two) times daily.       Allergies  Allergen Reactions  . Codeine     Consultations:  Behavioral health   Procedures/Studies: Ct Abdomen Pelvis Wo Contrast  Result Date: 08/31/2017 CLINICAL DATA:  Found down, bruising to chest EXAM: CT CHEST, ABDOMEN AND PELVIS WITHOUT CONTRAST TECHNIQUE: Multidetector CT imaging of the  chest, abdomen and pelvis was performed following the standard protocol without IV contrast. COMPARISON:  None. FINDINGS: CT CHEST FINDINGS Cardiovascular: Cardiomegaly.  No pericardial effusion. Prosthetic mitral valve.  Left chest ICD. No evidence of thoracic aortic aneurysm. Atherosclerotic calcifications of the aortic arch. Three vessel coronary atherosclerosis. Mediastinum/Nodes: No suspicious mediastinal or axillary lymphadenopathy. Thyroid is absent. Lungs/Pleura: Evaluation of the lung parenchyma is constrained by respiratory motion. Numerous pulmonary nodules bilaterally, most of which reflect calcified granulomata, benign. However, a dominant 7 mm noncalcified nodule is present in the medial left upper lobe (series 4/image 40). Additional 4 mm noncalcified nodule in the posterior left upper lobe (series 4/image 42). 8 x 5 mm nodule in the superior segment left lower lobe (series 4/image 61). Faint ground-glass centrilobular nodularity in the bilateral upper lobes. No focal consolidation. Trace  right pleural effusion. No pneumothorax. Musculoskeletal: No fracture is seen.  Median sternotomy. CT ABDOMEN PELVIS FINDINGS Motion degraded images. Hepatobiliary: Unenhanced liver is grossly unremarkable. Status post cholecystectomy. No intrahepatic or extrahepatic ductal dilatation. Pancreas: Within normal limits. Spleen: Within normal limits. Adrenals/Urinary Tract: Adrenal glands are within normal limits. Kidneys are within normal limits. No renal, ureteral, or bladder calculi. No hydronephrosis. Bladder is within normal limits. Stomach/Bowel: Stomach is within normal limits. No evidence of bowel obstruction. Normal appendix (series 3/image 102). Mild sigmoid diverticulosis, without evidence of diverticulitis. Vascular/Lymphatic: No evidence of abdominal aortic aneurysm. Atherosclerotic calcifications of the abdominal aorta and branch vessels. No suspicious abdominopelvic lymphadenopathy. Reproductive: Status post hysterectomy. No adnexal masses. Other: No abdominopelvic ascites. No hemoperitoneum or free air. Musculoskeletal: Mild superior endplate changes at L1. No fracture is seen. IMPRESSION: Motion degraded images. No evidence of traumatic injury to the abdomen/pelvis. Numerous bilateral pulmonary nodules, some of which reflect benign calcified granulomata, although dominant noncalcified nodules in the left lung measure up to 7 mm, poorly evaluated on the current motion-degraded study. Non-contrast chest CT at 3-6 months is recommended. If the nodules are stable at time of repeat CT, then future CT at 18-24 months (from today's scan) is considered optional for low-risk patients, but is recommended for high-risk patients. This recommendation follows the consensus statement: Guidelines for Management of Incidental Pulmonary Nodules Detected on CT Images: From the Fleischner Society 2017; Radiology 2017; 284:228-243. Electronically Signed   By: Julian Hy M.D.   On: 08/31/2017 00:23   Ct Head Wo  Contrast  Result Date: 08/31/2017 CLINICAL DATA:  Found down EXAM: CT HEAD WITHOUT CONTRAST CT CERVICAL SPINE WITHOUT CONTRAST TECHNIQUE: Multidetector CT imaging of the head and cervical spine was performed following the standard protocol without intravenous contrast. Multiplanar CT image reconstructions of the cervical spine were also generated. COMPARISON:  None. FINDINGS: CT HEAD FINDINGS Brain: No evidence of acute infarction, hemorrhage, hydrocephalus, extra-axial collection or mass lesion/mass effect. Mild cortical atrophy. Vascular: Intracranial atherosclerosis. Skull: Normal. Negative for fracture or focal lesion. Sinuses/Orbits: The visualized paranasal sinuses are essentially clear. The mastoid air cells are unopacified. Other: None. CT CERVICAL SPINE FINDINGS Alignment: Normal cervical lordosis. Skull base and vertebrae: No acute fracture. No primary bone lesion or focal pathologic process. Soft tissues and spinal canal: No prevertebral fluid or swelling. No visible canal hematoma. Disc levels: Vertebral body heights and intervertebral disc spaces are maintained. Spinal canal is patent. Upper chest: Visualized lung apices are clear. Other: Thyroid is absent. IMPRESSION: No evidence of acute intracranial abnormality. No evidence of traumatic injury to the cervical spine. Electronically Signed   By: Julian Hy M.D.   On:  08/31/2017 00:13   Ct Chest Wo Contrast  Result Date: 08/31/2017 CLINICAL DATA:  Found down, bruising to chest EXAM: CT CHEST, ABDOMEN AND PELVIS WITHOUT CONTRAST TECHNIQUE: Multidetector CT imaging of the chest, abdomen and pelvis was performed following the standard protocol without IV contrast. COMPARISON:  None. FINDINGS: CT CHEST FINDINGS Cardiovascular: Cardiomegaly.  No pericardial effusion. Prosthetic mitral valve.  Left chest ICD. No evidence of thoracic aortic aneurysm. Atherosclerotic calcifications of the aortic arch. Three vessel coronary atherosclerosis.  Mediastinum/Nodes: No suspicious mediastinal or axillary lymphadenopathy. Thyroid is absent. Lungs/Pleura: Evaluation of the lung parenchyma is constrained by respiratory motion. Numerous pulmonary nodules bilaterally, most of which reflect calcified granulomata, benign. However, a dominant 7 mm noncalcified nodule is present in the medial left upper lobe (series 4/image 40). Additional 4 mm noncalcified nodule in the posterior left upper lobe (series 4/image 42). 8 x 5 mm nodule in the superior segment left lower lobe (series 4/image 61). Faint ground-glass centrilobular nodularity in the bilateral upper lobes. No focal consolidation. Trace right pleural effusion. No pneumothorax. Musculoskeletal: No fracture is seen.  Median sternotomy. CT ABDOMEN PELVIS FINDINGS Motion degraded images. Hepatobiliary: Unenhanced liver is grossly unremarkable. Status post cholecystectomy. No intrahepatic or extrahepatic ductal dilatation. Pancreas: Within normal limits. Spleen: Within normal limits. Adrenals/Urinary Tract: Adrenal glands are within normal limits. Kidneys are within normal limits. No renal, ureteral, or bladder calculi. No hydronephrosis. Bladder is within normal limits. Stomach/Bowel: Stomach is within normal limits. No evidence of bowel obstruction. Normal appendix (series 3/image 102). Mild sigmoid diverticulosis, without evidence of diverticulitis. Vascular/Lymphatic: No evidence of abdominal aortic aneurysm. Atherosclerotic calcifications of the abdominal aorta and branch vessels. No suspicious abdominopelvic lymphadenopathy. Reproductive: Status post hysterectomy. No adnexal masses. Other: No abdominopelvic ascites. No hemoperitoneum or free air. Musculoskeletal: Mild superior endplate changes at L1. No fracture is seen. IMPRESSION: Motion degraded images. No evidence of traumatic injury to the abdomen/pelvis. Numerous bilateral pulmonary nodules, some of which reflect benign calcified granulomata, although  dominant noncalcified nodules in the left lung measure up to 7 mm, poorly evaluated on the current motion-degraded study. Non-contrast chest CT at 3-6 months is recommended. If the nodules are stable at time of repeat CT, then future CT at 18-24 months (from today's scan) is considered optional for low-risk patients, but is recommended for high-risk patients. This recommendation follows the consensus statement: Guidelines for Management of Incidental Pulmonary Nodules Detected on CT Images: From the Fleischner Society 2017; Radiology 2017; 284:228-243. Electronically Signed   By: Julian Hy M.D.   On: 08/31/2017 00:23   Ct Cervical Spine Wo Contrast  Result Date: 08/31/2017 CLINICAL DATA:  Found down EXAM: CT HEAD WITHOUT CONTRAST CT CERVICAL SPINE WITHOUT CONTRAST TECHNIQUE: Multidetector CT imaging of the head and cervical spine was performed following the standard protocol without intravenous contrast. Multiplanar CT image reconstructions of the cervical spine were also generated. COMPARISON:  None. FINDINGS: CT HEAD FINDINGS Brain: No evidence of acute infarction, hemorrhage, hydrocephalus, extra-axial collection or mass lesion/mass effect. Mild cortical atrophy. Vascular: Intracranial atherosclerosis. Skull: Normal. Negative for fracture or focal lesion. Sinuses/Orbits: The visualized paranasal sinuses are essentially clear. The mastoid air cells are unopacified. Other: None. CT CERVICAL SPINE FINDINGS Alignment: Normal cervical lordosis. Skull base and vertebrae: No acute fracture. No primary bone lesion or focal pathologic process. Soft tissues and spinal canal: No prevertebral fluid or swelling. No visible canal hematoma. Disc levels: Vertebral body heights and intervertebral disc spaces are maintained. Spinal canal is patent. Upper chest: Visualized lung  apices are clear. Other: Thyroid is absent. IMPRESSION: No evidence of acute intracranial abnormality. No evidence of traumatic injury to the  cervical spine. Electronically Signed   By: Julian Hy M.D.   On: 08/31/2017 00:13   Dg Chest Portable 1 View  Result Date: 08/31/2017 CLINICAL DATA:  Found down EXAM: PORTABLE CHEST 1 VIEW COMPARISON:  CT chest dated 08/30/2016 FINDINGS: Increased interstitial markings. No frank interstitial edema. Known bilateral pulmonary nodules (most of which reflect calcified granulomata) are better evaluated on recent CT. No pleural effusion or pneumothorax. Cardiomegaly.  Prosthetic mitral valve.  Left chest ICD. Median sternotomy. IMPRESSION: No evidence of acute cardiopulmonary disease. Known bilateral pulmonary nodules are better visualized on recent CT. Electronically Signed   By: Julian Hy M.D.   On: 08/31/2017 00:58   Dg Shoulder Left Portable  Result Date: 08/31/2017 CLINICAL DATA:  Fall EXAM: LEFT SHOULDER - 1 VIEW COMPARISON:  05/09/2015 FINDINGS: Old compression plate and screw fixation of a proximal humeral fracture. No evidence of hardware complication. No evidence of acute fracture or dislocation. Mild degenerative changes of the acromioclavicular joint. Visualized soft tissues are within normal limits. IMPRESSION: Status post ORIF of a left proximal humerus fracture, without evidence of complication. Electronically Signed   By: Julian Hy M.D.   On: 08/31/2017 00:59       Subjective: Had difficulty sleeping last night.  Denies any shortness of breath or chest pain.  Discharge Exam: Vitals:   09/04/17 1450 09/04/17 1620  BP:  132/71  Pulse:  73  Resp:  18  Temp:  97.8 F (36.6 C)  SpO2: 95% 95%   Vitals:   09/04/17 0536 09/04/17 0756 09/04/17 1450 09/04/17 1620  BP: 139/74   132/71  Pulse: 70   73  Resp:    18  Temp: (!) 97.4 F (36.3 C)   97.8 F (36.6 C)  TempSrc: Oral   Oral  SpO2: 94% 97% 95% 95%  Weight:      Height:        General: Pt is alert, awake, not in acute distress Cardiovascular: RRR, S1/S2 +, no rubs, no gallops Respiratory: CTA  bilaterally, no wheezing, no rhonchi Abdominal: Soft, NT, ND, bowel sounds + Extremities: Extensive bruising noted over extremities and trunk    The results of significant diagnostics from this hospitalization (including imaging, microbiology, ancillary and laboratory) are listed below for reference.     Microbiology: No results found for this or any previous visit (from the past 240 hour(s)).   Labs: BNP (last 3 results) No results for input(s): BNP in the last 8760 hours. Basic Metabolic Panel: Recent Labs  Lab 08/31/17 0605 09/01/17 0608 09/02/17 0501 09/03/17 0739 09/04/17 0415  NA 138 137 138 138 136  K 3.7 4.1 4.6 4.7 4.6  CL 102 101 100 100 99  CO2 30 29 31  32 29  GLUCOSE 80 101* 199* 154* 203*  BUN 10 13 20  27* 37*  CREATININE 0.79 0.86 0.98 0.89 1.02*  CALCIUM 8.8* 8.7* 8.9 9.1 8.8*  MG  --  1.7  --   --   --    Liver Function Tests: Recent Labs  Lab 08/31/17 0605 09/01/17 0608 09/02/17 0501 09/03/17 0739 09/04/17 0415  AST 42* 32 30 25 26   ALT 19 17 17 15 18   ALKPHOS 29* 31* 31* 33* 34*  BILITOT 1.1 1.2 1.1 1.1 0.9  PROT 6.8 7.1 7.3 7.3 6.8  ALBUMIN 3.7 3.8 3.8 3.8 3.7   No  results for input(s): LIPASE, AMYLASE in the last 168 hours. No results for input(s): AMMONIA in the last 168 hours. CBC: Recent Labs  Lab 08/31/17 0605 09/01/17 0608 09/02/17 0501 09/03/17 0739 09/04/17 0415  WBC 5.3 6.4 6.0 10.4 8.9  HGB 10.0* 9.8* 9.6* 9.7* 9.5*  HCT 32.9* 32.0* 32.1* 32.3* 31.8*  MCV 89.9 89.6 90.4 89.7 90.1  PLT 155 160 153 158 178   Cardiac Enzymes: No results for input(s): CKTOTAL, CKMB, CKMBINDEX, TROPONINI in the last 168 hours. BNP: Invalid input(s): POCBNP CBG: No results for input(s): GLUCAP in the last 168 hours. D-Dimer No results for input(s): DDIMER in the last 72 hours. Hgb A1c No results for input(s): HGBA1C in the last 72 hours. Lipid Profile No results for input(s): CHOL, HDL, LDLCALC, TRIG, CHOLHDL, LDLDIRECT in the last 72  hours. Thyroid function studies No results for input(s): TSH, T4TOTAL, T3FREE, THYROIDAB in the last 72 hours.  Invalid input(s): FREET3 Anemia work up No results for input(s): VITAMINB12, FOLATE, FERRITIN, TIBC, IRON, RETICCTPCT in the last 72 hours. Urinalysis    Component Value Date/Time   COLORURINE YELLOW 08/30/2017 2245   APPEARANCEUR CLEAR 08/30/2017 2245   LABSPEC 1.005 08/30/2017 2245   PHURINE 6.0 08/30/2017 2245   GLUCOSEU NEGATIVE 08/30/2017 2245   HGBUR NEGATIVE 08/30/2017 2245   BILIRUBINUR NEGATIVE 08/30/2017 2245   KETONESUR NEGATIVE 08/30/2017 2245   PROTEINUR NEGATIVE 08/30/2017 2245   NITRITE NEGATIVE 08/30/2017 2245   LEUKOCYTESUR NEGATIVE 08/30/2017 2245   Sepsis Labs Invalid input(s): PROCALCITONIN,  WBC,  LACTICIDVEN Microbiology No results found for this or any previous visit (from the past 240 hour(s)).   Time coordinating discharge: 51mins  SIGNED:   Kathie Dike, MD  [facility] 09/04/2017, 5:54 PM Pager   If 7PM-7AM, please contact night-coverage [email or web address] Password [email or web address]

## 2017-09-04 NOTE — Care Management Important Message (Signed)
Important Message  Patient Details  Name: Ana Martin MRN: [id number] Date of Birth: 1955/08/06   Medicare Important Message Given:  Yes    [name] 09/04/2017, 12:44 PM

## 2017-09-04 NOTE — Progress Notes (Signed)
ANTICOAGULATION CONSULT NOTE - follow up [facility] for warfarin/lovenox Indication: atrial fibrillation - mechanical mitral valve  Allergies  Allergen Reactions  . Codeine     Patient Measurements: Height: 5\' 7"  (170.2 cm) Weight: 204 lb (92.5 kg) IBW/kg (Calculated) : 61.6 Heparin Dosing Weight: 81.7 kg  Vital Signs: Temp: 97.4 F (36.3 C) (08/02 0536) Temp Source: Oral (08/02 0536) BP: 139/74 (08/02 0536) Pulse Rate: 70 (08/02 0536)  Labs: Recent Labs    09/02/17 0501 09/02/17 1618 09/02/17 2311 09/03/17 0739 09/04/17 0415  HGB 9.6*  --   --  9.7* 9.5*  HCT 32.1*  --   --  32.3* 31.8*  PLT 153  --   --  158 178  LABPROT 19.3*  --   --  21.6* 24.7*  INR 1.64  --   --  1.89 2.25  HEPARINUNFRC  --  0.20* 0.24* 0.67  --   CREATININE 0.98  --   --  0.89 1.02*    Estimated Creatinine Clearance: 65.9 mL/min (A) (by C-G formula based on SCr of 1.02 mg/dL (H)).   Medical History: Past Medical History:  Diagnosis Date  . Anxiety   . Atrial fibrillation ([place])   . Automatic implantable cardiac defibrillator in situ    [place] DR-T 09-20-2007 Dr.Akbary  . Depression   . Hyperlipidemia   . 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 Hypertension   . Lump or mass in breast    at the 7 o'clock position of right breast(non-tender)  . Other abnormal glucose   . Other left bundle branch block   . Other primary cardiomyopathies   . Thyroid cancer ([place])   . Thyroid disease   . Unspecified vitamin D deficiency   . Wolff-Parkinson-White syndrome     Medications:  Medications Prior to Admission  Medication Sig Dispense Refill Last Dose  . albuterol (PROAIR HFA) 108 (90 Base) MCG/ACT inhaler Inhale 1 puff into the lungs every 4 (four) hours as needed.     . ALPRAZolam (XANAX) 1 MG tablet Take 1 mg by mouth daily.   08/30/2017 at 2200  . amiodarone (PACERONE) 200 MG tablet Take 200 mg by mouth every other day.    08/30/2017 at 0700  .  budesonide-formoterol (SYMBICORT) 160-4.5 MCG/ACT inhaler Inhale 2 puffs into the lungs 2 (two) times daily.   Past Week at Unknown time  . carvedilol (COREG) 25 MG tablet Take 12.5-25 mg by mouth 2 (two) times daily with a meal. Take 12.5mg  in the morning and 25mg  in the evening   08/30/2017 at 2200  . clopidogrel (PLAVIX) 75 MG tablet Take 75 mg by mouth daily.    08/30/2017 at 2200  . fenofibrate 160 MG tablet Take 160 mg by mouth daily.   08/30/2017 at 2200  . FLUoxetine (PROZAC) 20 MG capsule Take 60 mg by mouth daily.    08/30/2017 at 2200  . furosemide (LASIX) 40 MG tablet Take 40 mg by mouth daily as needed.   08/29/2017 at Unknown time  . levothyroxine (SYNTHROID, LEVOTHROID) 200 MCG tablet Take 200 mcg by mouth daily.    08/30/2017 at 0700  . lisinopril (PRINIVIL,ZESTRIL) 40 MG tablet Take 40 mg by mouth daily.   08/30/2017 at 2200  . oxyCODONE-acetaminophen (PERCOCET) 7.5-325 MG per tablet Take 1 tablet by mouth every 6 (six) hours as needed.   Past Week at Unknown time  . potassium chloride SA (K-DUR,KLOR-CON) 20 MEQ tablet Take 20  mEq by mouth daily as needed.    08/30/2017 at 2200  . simvastatin (ZOCOR) 40 MG tablet Take 40 mg by mouth every evening.   08/30/2017 at 2200  . warfarin (COUMADIN) 2 MG tablet Take 2 mg by mouth as directed. 1 tablet daily except 1/2 tablet on Sunday & Wednesday    08/30/2017 at 2200    Assessment: Pharmacy consulted to dose heparin for patient with mechanical mitral valve. Patient is also taking coumadin. Patient came in after falling at home with INR elevated >10. Vitamin K 5 mg IV was given and now INR subtherapeutic but trending upward.  Per anti-coag notes, patient takes 17.5 mg/week of coumadin. Patient will be slow to return to therapeutic range with Vitamin K administration. Transition heparin to lovenox. Bridge with lovenox until INR therapeutic    Goal of Therapy:  INR 2.5-3.5 Monitor platelets by anticoagulation protocol: Yes   Plan:  Warfarin 2.5 mg  x 1 dose lovenox 1mg /kg (90mg ) sq q12h Continue to monitor H&H and platelets Monitor PT-INR daily and  s/s of bleeding.  Margot Ables, PharmD Clinical Pharmacist 09/04/2017 10:42 AM

## 2017-09-04 NOTE — BH Assessment (Signed)
Reassessment Note  Ana Martin is a 62 y.o. female who presented to [facility] with Depression & suicide attempt by OD of 30+ xanax on 08/31/17. Pt reports long hx of Depression & anxiety. Pt states intentional OD prior to admission was a "mistake". She states she was "under a lot of pressure and just snapped". At this time, pt reports feeling "completely different". She denies SI, & feels supported by sister & family. Pt denies HI & AVH. Pt requesting to be discharged to her sister's home but expressed understanding when NP explained reason for inpt hospitalization recommendation. Pt has fair insight & judgment. Her memory is intact. ? MSE: Pt is dressed in scrubs, alert, oriented x4 with normal speech and normal motor behavior. Eye contact is good. Pt's mood is positive. Thought process is coherent and relevant. There is no indication pt is currently responding to internal stimuli or experiencing delusional thought content. Pt was cooperative throughout assessment.    Disposition: Marvia Pickles, NP recommends inpatient hospitalization.  Diagnosis: F33.2 Major depressive disorder, Recurrent episode, Severe   Past Medical History:  Past Medical History:  Diagnosis Date  . Anxiety   . Atrial fibrillation ([place])   . Automatic implantable cardiac defibrillator in situ    Lumax DR-T 09-20-2007 Dr.Akbary  . Depression   . Hyperlipidemia   . 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 Hypertension   . Lump or mass in breast    at the 7 o'clock position of right breast(non-tender)  . Other abnormal glucose   . Other left bundle branch block   . Other primary cardiomyopathies   . Thyroid cancer ([place])   . Thyroid disease   . Unspecified vitamin D deficiency   . Wolff-Parkinson-White syndrome     Past Surgical History:  Procedure Laterality Date  . ABDOMINAL HYSTERECTOMY    . CARDIAC VALVE REPLACEMENT     st jude silzone mitray mechanical [id number] ss# [id number]  .  COLONOSCOPY     April 12, 2008  . PACEMAKER PLACEMENT      Family History:  Family History  Problem Relation Age of Onset  . Lung cancer Father        expired 25  . Sarcoidosis Mother        espired 2013    Social History:  reports that she quit smoking about 7 years ago. Her smoking use included cigarettes. She has a 25.00 pack-year smoking history. She has never used smokeless tobacco. She reports that she does not drink alcohol or use drugs.  Additional Social History:  Alcohol / Drug Use Pain Medications: See MAR Prescriptions: See MAR Over the Counter: See MAR History of alcohol / drug use?: No history of alcohol / drug abuse  CIWA: CIWA-Ar BP: 139/74 Pulse Rate: 70 COWS:    Allergies:  Allergies  Allergen Reactions  . Codeine     Home Medications:  Medications Prior to Admission  Medication Sig Dispense Refill  . albuterol (PROAIR HFA) 108 (90 Base) MCG/ACT inhaler Inhale 1 puff into the lungs every 4 (four) hours as needed.    . ALPRAZolam (XANAX) 1 MG tablet Take 1 mg by mouth daily.    Marland Kitchen amiodarone (PACERONE) 200 MG tablet Take 200 mg by mouth every other day.     . budesonide-formoterol (SYMBICORT) 160-4.5 MCG/ACT inhaler Inhale 2 puffs into the lungs 2 (two) times daily.    . carvedilol (COREG) 25 MG tablet Take 12.5-25 mg by mouth 2 (two)  times daily with a meal. Take 12.5mg  in the morning and 25mg  in the evening    . clopidogrel (PLAVIX) 75 MG tablet Take 75 mg by mouth daily.     . fenofibrate 160 MG tablet Take 160 mg by mouth daily.    Marland Kitchen FLUoxetine (PROZAC) 20 MG capsule Take 60 mg by mouth daily.     . furosemide (LASIX) 40 MG tablet Take 40 mg by mouth daily as needed.    Marland Kitchen levothyroxine (SYNTHROID, LEVOTHROID) 200 MCG tablet Take 200 mcg by mouth daily.     Marland Kitchen lisinopril (PRINIVIL,ZESTRIL) 40 MG tablet Take 40 mg by mouth daily.    Marland Kitchen oxyCODONE-acetaminophen (PERCOCET) 7.5-325 MG per tablet Take 1 tablet by mouth every 6 (six) hours as needed.    .  potassium chloride SA (K-DUR,KLOR-CON) 20 MEQ tablet Take 20 mEq by mouth daily as needed.     . simvastatin (ZOCOR) 40 MG tablet Take 40 mg by mouth every evening.    . warfarin (COUMADIN) 2 MG tablet Take 2 mg by mouth as directed. 1 tablet daily except 1/2 tablet on Sunday & Wednesday       OB/GYN Status:  No LMP recorded. Patient has had a hysterectomy.  General Assessment Data Location of Assessment: [facility] TTS Assessment: In system Is this a Tele or Face-to-Face Assessment?: Tele Assessment Is this an Initial Assessment or a Re-assessment for this encounter?: Initial Assessment Marital status: Widowed [place] name: Ana Martin Is patient pregnant?: No Pregnancy Status: No Living Arrangements: Alone Can pt return to current living arrangement?: Yes Admission Status: Voluntary Is patient capable of signing voluntary admission?: Yes Referral Source: Self/Family/Friend Insurance type: [employer]     Crisis Care Plan Living Arrangements: Alone Legal Guardian: (None) Name of Psychiatrist: None Name of Therapist: None  Education Status Is patient currently in school?: No Is the patient employed, unemployed or receiving disability?: Receiving disability income  Risk to self with the past 6 months Suicidal Ideation: Yes-Currently Present Has patient been a risk to self within the past 6 months prior to admission? : No Suicidal Intent: Yes-Currently Present Has patient had any suicidal intent within the past 6 months prior to admission? : No Is patient at risk for suicide?: Yes Suicidal Plan?: Yes-Currently Present Has patient had any suicidal plan within the past 6 months prior to admission? : No Specify Current Suicidal Plan: Pt intentionally ingested 30 39 Xanax in suicide attempt Access to Means: Yes Specify Access to Suicidal Means: Prescribed medicine Previous Attempts/Gestures: No Intentional Self Injurious Behavior: None Family Suicide History: Unknown Recent  stressful life event(s): Conflict (Comment)(Conflict with daughter) Persecutory voices/beliefs?: No Depression: Yes Depression Symptoms: Despondent, Insomnia, Fatigue, Loss of interest in usual pleasures, Feeling worthless/self pity Substance abuse history and/or treatment for substance abuse?: No Suicide prevention information given to non-admitted patients: Not applicable  Risk to Others within the past 6 months Homicidal Ideation: No Does patient have any lifetime risk of violence toward others beyond the six months prior to admission? : No Thoughts of Harm to Others: No Current Homicidal Intent: No Current Homicidal Plan: No Access to Homicidal Means: No History of harm to others?: No Assessment of Violence: None Noted Does patient have access to weapons?: No Criminal Charges Pending?: No Does patient have a court date: No Is patient on probation?: No  Psychosis Hallucinations: None noted Delusions: None noted  Mental Status Report Appearance/Hygiene: Unremarkable, In scrubs Eye Contact: Good Motor Activity: Unremarkable, Freedom of movement Speech: Unremarkable Level of  Consciousness: Alert Mood: Depressed Affect: Appropriate to circumstance Anxiety Level: None Thought Processes: Coherent, Relevant Judgement: Impaired Orientation: Person, Place, Time, Situation Obsessive Compulsive Thoughts/Behaviors: None  Cognitive Functioning Concentration: Normal Memory: Recent Intact, Remote Intact Is patient IDD: No Is patient DD?: No Insight: Poor Impulse Control: Poor Appetite: Good Have you had any weight changes? : No Change Sleep: Decreased Total Hours of Sleep: 3 Vegetative Symptoms: None  ADLScreening [facility] Assessment Services) Patient's cognitive ability adequate to safely complete daily activities?: Yes Patient able to express need for assistance with ADLs?: Yes Independently performs ADLs?: Yes (appropriate for developmental age)  Prior Inpatient  Therapy Prior Inpatient Therapy: No  Prior Outpatient Therapy Prior Outpatient Therapy: No Does patient have an ACCT team?: No Does patient have Intensive In-House Services?  : No Does patient have [facility] services? : No Does patient have P4CC services?: No  ADL Screening (condition at time of admission) Patient's cognitive ability adequate to safely complete daily activities?: Yes Is the patient deaf or have difficulty hearing?: No Does the patient have difficulty seeing, even when wearing glasses/contacts?: No Does the patient have difficulty concentrating, remembering, or making decisions?: No Patient able to express need for assistance with ADLs?: Yes Does the patient have difficulty dressing or bathing?: Yes Independently performs ADLs?: Yes (appropriate for developmental age) Communication: Independent Dressing (OT): Independent Is this a change from baseline?: Change from baseline, expected to last <3days Grooming: Independent Feeding: Independent Bathing: Needs assistance Is this a change from baseline?: Change from baseline, expected to last <3 days Toileting: Needs assistance Is this a change from baseline?: Change from baseline, expected to last <3 days In/Out Bed: Needs assistance Is this a change from baseline?: Change from baseline, expected to last <3 days Walks in Home: Needs assistance Is this a change from baseline?: Change from baseline, expected to last <3 days Does the patient have difficulty walking or climbing stairs?: No Weakness of Legs: None Weakness of Arms/Hands: None  Home Assistive Devices/Equipment Home Assistive Devices/Equipment: None  Therapy Consults (therapy consults require a physician order) PT Evaluation Needed: No OT Evalulation Needed: No SLP Evaluation Needed: No Abuse/Neglect Assessment (Assessment to be complete while patient is alone) Abuse/Neglect Assessment Can Be Completed: Yes Physical Abuse: Denies Verbal Abuse:  Denies Sexual Abuse: Denies Exploitation of patient/patient's resources: Denies Self-Neglect: Denies Values / Beliefs Cultural Requests During Hospitalization: None Spiritual Requests During Hospitalization: None Consults Spiritual Care Consult Needed: No Social Work Consult Needed: No Regulatory affairs officer (For Healthcare) Does Patient Have a Medical Advance Directive?: No Type of Advance Directive: Press photographer, Living will Copy of Healthcare Power of Attorney in Chart?: No - copy requested Copy of Living Will in Chart?: No - copy requested Would patient like information on creating a medical advance directive?: No - Patient declined Nutrition Screen- [facility] Adult/[facility]/AP Patient's home diet: Regular Has the patient recently lost weight without trying?: No Has the patient been eating poorly because of a decreased appetite?: No Malnutrition Screening Tool Score: 0  Additional Information 1:1 In Past 12 Months?: No CIRT Risk: No Elopement Risk: No Does patient have medical clearance?: Yes     Disposition:  Disposition Initial Assessment Completed for this Encounter: Yes Disposition of Patient: Admit Type of inpatient treatment program: Adult(Per S. Rankin, NP, Pt meets inpatient criteria)  On Site Evaluation by:   Reviewed with Physician:    Richardean Chimera 09/04/2017 12:51 PM

## 2017-09-04 NOTE — Care Management Note (Signed)
Case Management Note  Patient Details  Name: Ana Martin MRN: [id number] Date of Birth: 1955/10/13       Admitted after taking too many xanex. Pt from home, lived with and cared for her grandchildren. Pt initially recommended for SNF, refused, and is now recommended for [facility]. PT agreeable to [facility]. Pt also recommended for inpt psych treatment. Pt has chosen [facility] from list of [facility] providers if she is cleared from psych standpoint. CM has given [facility] rep referral and they will be notified if pt discharges home. When she goes home she will go stay with her sister in [place] at [address] [place], [place]. PT recommends RW, per pt she has RW at home to use.  Pt not medically cleared for psych treatment as of 09/03/2017. CM will cont to follow.               Expected Discharge Date:  09/01/17               Expected Discharge Plan:    inpt [place] vs home with [facility]  In-House Referral:  Clinical Social Work  Discharge planning Services  CM Consult  Post Acute Care Choice:  Home Health Choice offered to:  Patient  DME Arranged:    DME Agency:     HH Arranged:  PT, RN [place] Agency:  [place]  Status of Service:  In process, will continue to follow  If discussed at Long Length of Stay Meetings, dates discussed:    Additional Comments:  Sherald Barge, RN 09/04/2017, 8:54 AM

## 2017-09-04 NOTE — Progress Notes (Signed)
Inpatient Diabetes Program Recommendations  AACE/ADA: New Consensus Statement on Inpatient Glycemic Control (2015)  Target Ranges:  Prepandial:   less than 140 mg/dL      Peak postprandial:   less than 180 mg/dL (1-2 hours)      Critically ill patients:  140 - 180 mg/dL   Results for LIGAYA, CORMIER (MRN [id number]) as of 09/04/2017 13:27  Ref. Range 09/02/2017 05:01 09/02/2017 16:18 09/02/2017 23:11 09/03/2017 07:39 09/04/2017 04:15  Glucose Latest Ref Range: 70 - 99 mg/dL 199 (H)   154 (H) 203 (H)    Diabetes history: None Home Diabetes medications: None Current orders for Inpatient glycemic control: None  Inpatient Diabetes Program Recommendations:    Patient receiving Solumedrol 60mg  Q12 hours. Noted elevation in lab glucose levels.   MD please consider placing orders for Novolog Sensitive (0-9 units) Correction Scale TID AC+HS   -- Will follow during hospitalization.--   Jonna Clark RN, MSN Diabetes Coordinator Inpatient Glycemic Control Team Team Pager: [phone] (8am-5pm)

## 2017-09-04 NOTE — Progress Notes (Signed)
Report called to Benjamine Mola, RN at [facility]. Patient to be transported to [facility] around 8pm by [facility] as patient is going voluntary.

## 2017-09-05 DIAGNOSIS — T424X2A Poisoning by benzodiazepines, intentional self-harm, initial encounter: Secondary | ICD-10-CM

## 2017-09-05 DIAGNOSIS — T1491XA Suicide attempt, initial encounter: Secondary | ICD-10-CM

## 2017-09-05 DIAGNOSIS — F332 Major depressive disorder, recurrent severe without psychotic features: Principal | ICD-10-CM

## 2017-09-05 DIAGNOSIS — Z6379 Other stressful life events affecting family and household: Secondary | ICD-10-CM

## 2017-09-05 LAB — TSH: TSH: 4.139 u[IU]/mL (ref 0.350–4.500)

## 2017-09-05 LAB — PROTIME-INR
INR: 2.68
PROTHROMBIN TIME: 28.3 s — AB (ref 11.4–15.2)

## 2017-09-05 MED ORDER — WARFARIN - PHARMACIST DOSING INPATIENT
Freq: Every day | Status: DC
  Administered 2017-09-05 – 2017-09-09 (×4)
  Filled 2017-09-05 (×14): qty 1

## 2017-09-05 MED ORDER — WARFARIN SODIUM 1 MG PO TABS
1.5000 mg | ORAL_TABLET | Freq: Once | ORAL | Status: AC
  Administered 2017-09-05: 1.5 mg via ORAL
  Filled 2017-09-05: qty 1

## 2017-09-05 MED ORDER — ALPRAZOLAM 0.5 MG PO TABS
0.5000 mg | ORAL_TABLET | Freq: Every day | ORAL | Status: DC
  Administered 2017-09-05 – 2017-09-07 (×3): 0.5 mg via ORAL
  Filled 2017-09-05 (×3): qty 1

## 2017-09-05 MED ORDER — FLUOXETINE HCL 20 MG PO CAPS
40.0000 mg | ORAL_CAPSULE | Freq: Every day | ORAL | Status: DC
  Administered 2017-09-05: 40 mg via ORAL
  Filled 2017-09-05 (×2): qty 2

## 2017-09-05 MED ORDER — VENLAFAXINE HCL ER 37.5 MG PO CP24
37.5000 mg | ORAL_CAPSULE | Freq: Every day | ORAL | Status: DC
Start: 2017-09-05 — End: 2017-09-06
  Administered 2017-09-05 – 2017-09-06 (×2): 37.5 mg via ORAL
  Filled 2017-09-05 (×4): qty 1

## 2017-09-05 NOTE — [facility] Group Notes (Signed)
[facility] Group Notes:  (Nursing/MHT/Case Management/Adjunct)  Date:  09/05/2017  Time:  2:33 PM  Type of Therapy:  Psychoeducational Skills  Participation Level:  Did Not Attend  Participation Quality:  Did not attend  Affect:  Did not attend  Cognitive:  Did not attend  Insight:  None  Engagement in Group:  Did not attend  Modes of Intervention:  Did not attend  Summary of Progress/Problems: Pt did not attend Psychoeducational group with topic healthy support systems.  Ana Martin 09/05/2017, 2:33 PM

## 2017-09-05 NOTE — Progress Notes (Signed)
D.  Pt pleasant on approach, denies complaints at this time.  Pt reports medications still need to be reviewed as they are not correct the way they are now input.  Pt was positive for evening wrap up group, has been engaged well with peers on the unit.  Pt denies SI/HI/AVH and is hopeful to return home soon.  Pt will live with her sister at discharge.  A.  Support and encouragement offered, medication given as ordered  R. Pt remains safe on the unit, will continue to monitor.

## 2017-09-05 NOTE — H&P (Signed)
Psychiatric Admission Assessment Adult  Patient Identification: Ana Martin MRN:  [id number] Date of Evaluation:  09/05/2017 Chief Complaint:  mdd recurrent severe  Principal Diagnosis: <principal problem not specified> Diagnosis:   Patient Active Problem List   Diagnosis Date Noted  . MDD (major depressive disorder), recurrent severe, without psychosis ([place]) [F33.2] 09/04/2017  . Elevated INR [R79.1] 08/31/2017  . Syncope [R55] 08/31/2017  . Anticoagulant overdosage [T45.511A] 08/31/2017  . Wolff-Parkinson-White syndrome [I45.6] 08/31/2017  . COPD (chronic obstructive pulmonary disease) ([place]) [J44.9] 08/31/2017  . Pleuritic chest pain [R07.81] 08/31/2017  . Former smoker [W65.681] 08/31/2017  . Anxiety [F41.9]   . Depression [F32.9]   . Hypertension [I10]   . Hyperlipidemia [E78.5]   . Thyroid disease [E07.9]   . Other abnormal glucose [R73.09]   . Automatic implantable cardiac defibrillator in situ [Z95.810]   . Atrial fibrillation ([place]) [I48.91]   . Other left bundle branch block [I44.7]   . 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 Hypersomnia, unspecified [G47.10]   . Lump or mass in breast [N63.0]   . Unspecified vitamin D deficiency [E55.9]   . Thyroid cancer ([place]) [C73]    History of Present Illness: Patient is seen and examined.  Patient is a 62 year old female with a past psychiatric history significant for depression anxiety who recently took an intentional overdose of Xanax and a suicide attempt.  She presented to the [facility] emergency room on 7/29.  She had a fall at home.  She fell onto her closet.  She denied any loss of consciousness.  Her drug screen on admission was significant for amphetamines as well as benzodiazepines.  She was admitted to the medical side of the hospital initially.  She has a past medical history significant for chronic anticoagulation, hypertension, mechanical heart valve, WPW, automated defibrillator and history of thyroid cancer.   She was stabilized in the medical hospital.  She was actually started on steroids for a COPD exacerbation.  She was transferred to our hospital for evaluation and stabilization.  Patient stated that she had been treated for depression and anxiety for years with Prozac, Zoloft, Xanax.  She stated that most recently her Prozac had been increased to 60 mg p.o. daily.  She has not seen a psychiatrist.  She gets her psychiatric medicines from her primary care provider.  She stated that her daughter has some emotional and what sounds like substance issues, and that she is the primary caretaker of 2 of her grandchildren.  Her 71 year old grandchild recently moved in with her.  Her daughter is been trying to come into the home, and take more of a role with the children.  The patient and her daughter got into an argument, and she was upset with this.  She took the overdose of Xanax.  Apparently she took 30-1 mg Xanax tablets.  Her drug screen also had amphetamines in it.  Her grandchild takes stimulants for attention deficit disorder issues, but does not recall taking that medication.  She stated that the overdose was a mistake, and she feels bad about it and is remorseful.  She did state that even prior to the event the Prozac was not really helping her depression.  She was admitted to the hospital for evaluation and stabilization. Associated Signs/Symptoms: Depression Symptoms:  depressed mood, anhedonia, psychomotor retardation, fatigue, feelings of worthlessness/guilt, difficulty concentrating, hopelessness, suicidal thoughts without plan, suicidal thoughts with specific plan, suicidal attempt, anxiety, loss of energy/fatigue, disturbed sleep, (Hypo) Manic Symptoms:  Impulsivity, Anxiety Symptoms:  Excessive Worry, Psychotic Symptoms:  Denied PTSD Symptoms: Negative Total Time spent with patient: 45 minutes  Past Psychiatric History: Patient stated that she has a long history of depression.  She is  been treated for depression over 20 years with antidepressant medicines as well as Xanax.  She stated she had previously taken Prozac as well as Zoloft.  She stated the Prozac was recently increased.  She states she takes Xanax at least once a day, and sometimes twice a day.  She denied any previous psychiatric admissions.  Her primary care provider writes for her antidepressant medications.  Is the patient at risk to self? Yes.    Has the patient been a risk to self in the past 6 months? No.  Has the patient been a risk to self within the distant past? No.  Is the patient a risk to others? No.  Has the patient been a risk to others in the past 6 months? No.  Has the patient been a risk to others within the distant past? No.   Prior Inpatient Therapy:   Prior Outpatient Therapy:    Alcohol Screening: 1. How often do you have a drink containing alcohol?: Never 2. How many drinks containing alcohol do you have on a typical day when you are drinking?: 1 or 2 3. How often do you have six or more drinks on one occasion?: Never AUDIT-C Score: 0 4. How often during the last year have you found that you were not able to stop drinking once you had started?: Never 5. How often during the last year have you failed to do what was normally expected from you becasue of drinking?: Never 6. How often during the last year have you needed a first drink in the morning to get yourself going after a heavy drinking session?: Never 7. How often during the last year have you had a feeling of guilt of remorse after drinking?: Never 8. How often during the last year have you been unable to remember what happened the night before because you had been drinking?: Never 9. Have you or someone else been injured as a result of your drinking?: No 10. Has a relative or friend or a doctor or another health worker been concerned about your drinking or suggested you cut down?: No Alcohol Use Disorder Identification Test Final  Score (AUDIT): 0 Intervention/Follow-up: AUDIT Score <7 follow-up not indicated Substance Abuse History in the last 12 months:  No. Consequences of Substance Abuse: Negative Previous Psychotropic Medications: Yes  Psychological Evaluations: No  Past Medical History:  Past Medical History:  Diagnosis Date  . Anxiety   . Atrial fibrillation ([place])   . Automatic implantable cardiac defibrillator in situ    [place] DR-T 09-20-2007 Dr.Akbary  . Depression   . Hyperlipidemia   . 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 Hypertension   . Lump or mass in breast    at the 7 o'clock position of right breast(non-tender)  . Other abnormal glucose   . Other left bundle branch block   . Other primary cardiomyopathies   . Thyroid cancer ([place])   . Thyroid disease   . Unspecified vitamin D deficiency   . Wolff-Parkinson-White syndrome     Past Surgical History:  Procedure Laterality Date  . ABDOMINAL HYSTERECTOMY    . CARDIAC VALVE REPLACEMENT     st jude silzone mitray mechanical [id number] ss# [id number]  . COLONOSCOPY     April 12, 2008  .  PACEMAKER PLACEMENT     Family History:  Family History  Problem Relation Age of Onset  . Lung cancer Father        expired 67  . Sarcoidosis Mother        espired 2013   Family Psychiatric  History: Denied Tobacco Screening: Have you used any form of tobacco in the last 30 days? (Cigarettes, Smokeless Tobacco, Cigars, and/or Pipes): No Social History:  Social History   Substance and Sexual Activity  Alcohol Use No     Social History   Substance and Sexual Activity  Drug Use No    Additional Social History: Marital status: Widowed Widowed, when?: 20 plus years Are you sexually active?: No What is your sexual orientation?: straight Has your sexual activity been affected by drugs, alcohol, medication, or emotional stress?: n/a Does patient have children?: Yes How many children?: 1 How is patient's relationship with  their children?: not good                         Allergies:   Allergies  Allergen Reactions  . Codeine Nausea Only   Lab Results:  Results for orders placed or performed during the hospital encounter of 09/04/17 (from the past 48 hour(s))  TSH     Status: None   Collection Time: 09/05/17  6:12 AM  Result Value Ref Range   TSH 4.139 0.350 - 4.500 uIU/mL    Comment: Performed by a 3rd Generation assay with a functional sensitivity of <=0.01 uIU/mL. Performed at [facility], [place] [address] [place], [place] [address]   Protime-INR     Status: Abnormal   Collection Time: 09/05/17  6:12 AM  Result Value Ref Range   Prothrombin Time 28.3 (H) 11.4 - 15.2 seconds   INR 2.68     Comment: Performed at [facility], [place] [address], [place], [place] [address]    Blood Alcohol level:  Lab Results  Component Value Date   ETH <10 73/53/2992    Metabolic Disorder Labs:  No results found for: HGBA1C, MPG No results found for: PROLACTIN No results found for: CHOL, TRIG, HDL, CHOLHDL, VLDL, LDLCALC  Current Medications: Current Facility-Administered Medications  Medication Dose Route Frequency Provider Last Rate Last Dose  . acetaminophen (TYLENOL) tablet 650 mg  650 mg Oral Q6H PRN Money, Lowry Ram, FNP   650 mg at 09/05/17 1607  . albuterol (PROVENTIL HFA;VENTOLIN HFA) 108 (90 Base) MCG/ACT inhaler 1-2 puff  1-2 puff Inhalation Q6H PRN Laverle Hobby, PA-C      . ALPRAZolam Duanne Moron) tablet 0.5 mg  0.5 mg Oral Daily Sharma Covert, MD   0.5 mg at 09/05/17 4268  . alum & mag hydroxide-simeth (MAALOX/MYLANTA) 200-200-20 MG/5ML suspension 30 mL  30 mL Oral Q4H PRN Money, Lowry Ram, FNP      . amiodarone (PACERONE) tablet 200 mg  200 mg Oral Daily Money, Lowry Ram, FNP   200 mg at 09/05/17 3419  . atorvastatin (LIPITOR) tablet 20 mg  20 mg Oral q1800 Money, Darnelle Maffucci B, FNP      . carvedilol (COREG) tablet 12.5 mg  12.5 mg Oral Q breakfast  Money, Darnelle Maffucci B, FNP   12.5 mg at 09/05/17 0924  . carvedilol (COREG) tablet 25 mg  25 mg Oral Q supper Money, Lowry Ram, FNP      . clopidogrel (PLAVIX) tablet 75 mg  75 mg Oral Daily Money, Lowry Ram, FNP  75 mg at 09/05/17 0924  . fenofibrate tablet 160 mg  160 mg Oral Daily Money, Lowry Ram, FNP   160 mg at 09/05/17 9417  . FLUoxetine (PROZAC) capsule 40 mg  40 mg Oral QHS Sharma Covert, MD      . fluticasone [facility] HFA) 44 MCG/ACT inhaler 2 puff  2 puff Inhalation BID Laverle Hobby, PA-C   2 puff at 09/05/17 4081  . furosemide (LASIX) tablet 40 mg  40 mg Oral Daily Money, Lowry Ram, FNP   40 mg at 09/05/17 4481  . guaiFENesin (MUCINEX) 12 hr tablet 1,200 mg  1,200 mg Oral BID Money, Lowry Ram, FNP   1,200 mg at 09/05/17 8563  . hydrOXYzine (ATARAX/VISTARIL) tablet 25 mg  25 mg Oral TID PRN Money, Lowry Ram, FNP   25 mg at 09/04/17 2324  . ipratropium-albuterol (DUONEB) 0.5-2.5 (3) MG/3ML nebulizer solution 3 mL  3 mL Nebulization TID Money, Darnelle Maffucci B, FNP      . levothyroxine (SYNTHROID, LEVOTHROID) tablet 200 mcg  200 mcg Oral QAC breakfast Money, Lowry Ram, FNP   200 mcg at 09/05/17 1497  . lisinopril (PRINIVIL,ZESTRIL) tablet 40 mg  40 mg Oral Daily Laverle Hobby, PA-C   40 mg at 09/05/17 0263  . magnesium hydroxide (MILK OF MAGNESIA) suspension 30 mL  30 mL Oral Daily PRN Money, Lowry Ram, FNP      . potassium chloride SA (K-DUR,KLOR-CON) CR tablet 20 mEq  20 mEq Oral BID Laverle Hobby, PA-C   20 mEq at 09/05/17 7858  . [START ON 09/07/2017] predniSONE (DELTASONE) tablet 30 mg  30 mg Oral Q breakfast Sharma Covert, MD       Followed by  . [START ON 09/09/2017] predniSONE (DELTASONE) tablet 20 mg  20 mg Oral Q breakfast Sharma Covert, MD       Followed by  . [START ON 09/11/2017] predniSONE (DELTASONE) tablet 10 mg  10 mg Oral Q breakfast Sharma Covert, MD      . predniSONE (DELTASONE) tablet 40 mg  40 mg Oral Q breakfast Laverle Hobby, PA-C   40 mg at 09/05/17 8502  .  traZODone (DESYREL) tablet 50 mg  50 mg Oral QHS PRN Money, Lowry Ram, FNP   50 mg at 09/04/17 2319  . venlafaxine XR (EFFEXOR-XR) 24 hr capsule 37.5 mg  37.5 mg Oral Q breakfast Sharma Covert, MD   37.5 mg at 09/05/17 7741  . warfarin (COUMADIN) tablet 1.5 mg  1.5 mg Oral ONCE-1800 Julien Girt, RPH      . Warfarin - Pharmacist Dosing Inpatient   Does not apply [id number] Sharma Covert, MD       PTA Medications: Medications Prior to Admission  Medication Sig Dispense Refill Last Dose  . acetaminophen (TYLENOL) 325 MG tablet Take 650 mg by mouth every 6 (six) hours as needed for mild pain or headache.     . Melatonin 5 MG TABS Take 5-10 mg by mouth at bedtime.     Marland Kitchen albuterol (PROAIR HFA) 108 (90 Base) MCG/ACT inhaler Inhale 1 puff into the lungs every 4 (four) hours as needed.     . ALPRAZolam (XANAX) 1 MG tablet Take 0.5 tablets (0.5 mg total) by mouth at bedtime as needed for anxiety. 30 tablet 0   . amiodarone (PACERONE) 200 MG tablet Take 200 mg by mouth every other day.    08/30/2017 at 0700  . atorvastatin (LIPITOR) 20 MG tablet Take  1 tablet (20 mg total) by mouth daily at 6 PM.     . budesonide-formoterol (SYMBICORT) 160-4.5 MCG/ACT inhaler Inhale 2 puffs into the lungs 2 (two) times daily.   Past Week at Unknown time  . carvedilol (COREG) 25 MG tablet Take 12.5-25 mg by mouth 2 (two) times daily with a meal. Take 12.5mg  in the morning and 25mg  in the evening   08/30/2017 at 2200  . clopidogrel (PLAVIX) 75 MG tablet Take 75 mg by mouth daily.    08/30/2017 at 2200  . enoxaparin (LOVENOX) 100 MG/ML injection Inject 0.9 mLs (90 mg total) into the skin every 12 (twelve) hours. 0 Syringe    . fenofibrate 160 MG tablet Take 160 mg by mouth daily.   08/30/2017 at 2200  . FLUoxetine (PROZAC) 20 MG capsule Take 60 mg by mouth daily.    08/30/2017 at 2200  . furosemide (LASIX) 40 MG tablet Take 40 mg by mouth daily as needed.   08/29/2017 at Unknown time  . ipratropium-albuterol (DUONEB)  0.5-2.5 (3) MG/3ML SOLN Take 3 mLs by nebulization 3 (three) times daily. 360 mL    . levothyroxine (SYNTHROID, LEVOTHROID) 200 MCG tablet Take 200 mcg by mouth daily.    08/30/2017 at 0700  . lisinopril (PRINIVIL,ZESTRIL) 40 MG tablet Take 40 mg by mouth daily.   08/30/2017 at 2200  . oxyCODONE-acetaminophen (PERCOCET) 7.5-325 MG per tablet Take 1 tablet by mouth every 6 (six) hours as needed.   Past Week at Unknown time  . potassium chloride SA (K-DUR,KLOR-CON) 20 MEQ tablet Take 20 mEq by mouth daily as needed.    08/30/2017 at 2200  . predniSONE (DELTASONE) 10 MG tablet Take 40mg  po daily for 2 days then 30mg  daily for 2 days then 20mg  daily for 2 days then 10mg  daily for 2 days then stop 30 tablet 0   . warfarin (COUMADIN) 2 MG tablet Take 2 mg by mouth as directed. 1 tablet daily except 1/2 tablet on Sunday & Wednesday    08/30/2017 at 2200    Musculoskeletal: Strength & Muscle Tone: within normal limits Gait & Station: normal Patient leans: N/A  Psychiatric Specialty Exam: Physical Exam  Nursing note and vitals reviewed. Constitutional: She is oriented to person, place, and time. She appears well-developed and well-nourished.  HENT:  Head: Normocephalic and atraumatic.  Respiratory: Effort normal.  Neurological: She is alert and oriented to person, place, and time.    ROS  Blood pressure 133/75, pulse 70, temperature 98.1 F (36.7 C), temperature source Oral, resp. rate 18, height 5\' 7"  (1.702 m), weight 92.1 kg (203 lb), SpO2 97 %.Body mass index is 31.79 kg/m.  General Appearance: Disheveled  Eye Contact:  Fair  Speech:  Slow  Volume:  Decreased  Mood:  Depressed  Affect:  Congruent  Thought Process:  Coherent  Orientation:  Full (Time, Place, and Person)  Thought Content:  Logical  Suicidal Thoughts:  No  Homicidal Thoughts:  No  Memory:  Immediate;   Fair Recent;   Fair Remote;   Fair  Judgement:  Fair  Insight:  Fair  Psychomotor Activity:  Decreased and Psychomotor  Retardation  Concentration:  Concentration: Fair and Attention Span: Fair  Recall:  [employer] of Knowledge:  Fair  Language:  Fair  Akathisia:  Negative  Handed:  Right  AIMS (if indicated):     Assets:  Communication Skills Desire for Improvement Financial Resources/Insurance Housing Resilience Social Support  ADL's:  Intact  Cognition:  WNL  Sleep:  Number of Hours: 5.5    Treatment Plan Summary: Daily contact with patient to assess and evaluate symptoms and progress in treatment, Medication management and Plan : Patient is seen and examined.  Patient is a 62 year old female with the above-stated past psychiatric history was admitted after an intentional overdose of Xanax.  She will be admitted to the the unit.  She will be integrated in the population.  She will be seen by social work both individually in groups.  She will be encouraged to go to coping skills training.  We will collect collateral information on her.  Clearly the fluoxetine is not effective.  I will begin weaning that.  I am going to start her on Effexor XR 37.5 mg p.o. daily and titrate that during the course of hospitalization.  I think it is unlikely that her primary care provider will be willing to write her for Xanax after the intentional overdose.  She said she takes 1 mg usually daily, and sometimes twice a day.  I am going to reduce her dosage to 0.5 mg p.o. daily and wean her down.  She is on multiple medications for her multiple medical problems.  The pharmacy has taken a lead on her Coumadin, and adjusted her dosage.  We will follow her PT and INR.  She will continue her amiodarone, carvedilol, Plavix, lisinopril for hypertension and cardiovascular issues.  She will have albuterol inhaler as necessary for COPD.  We will continue her steroids.  She has a history of thyroid cancer and is on levothyroxine.  That will be continued.  Depending on her physical strength and how it improves once the Xanax is out of her  system we may need to get a physical therapy consultation.  Otherwise we will continue to monitor her.  Observation Level/Precautions:  Fall 15 minute checks  Laboratory:  Chemistry Profile  Psychotherapy:    Medications:    Consultations:    Discharge Concerns:    Estimated LOS:  Other:     Physician Treatment Plan for Primary Diagnosis: <principal problem not specified> Long Term Goal(s): Improvement in symptoms so as ready for discharge  Short Term Goals: Ability to identify changes in lifestyle to reduce recurrence of condition will improve, Ability to verbalize feelings will improve, Ability to disclose and discuss suicidal ideas, Ability to demonstrate self-control will improve, Ability to identify and develop effective coping behaviors will improve and Ability to maintain clinical measurements within normal limits will improve  Physician Treatment Plan for Secondary Diagnosis: Active Problems:   MDD (major depressive disorder), recurrent severe, without psychosis ([place])  Long Term Goal(s): Improvement in symptoms so as ready for discharge  Short Term Goals: Ability to identify changes in lifestyle to reduce recurrence of condition will improve, Ability to verbalize feelings will improve, Ability to disclose and discuss suicidal ideas, Ability to demonstrate self-control will improve, Ability to identify and develop effective coping behaviors will improve and Ability to maintain clinical measurements within normal limits will improve  I certify that inpatient services furnished can reasonably be expected to improve the patient's condition.    Sharma Covert, MD 8/3/20195:12 PM
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
## 2017-09-05 NOTE — Progress Notes (Signed)
ANTICOAGULATION CONSULT NOTE - Follow Up Consult  Pharmacy Consult for Warfarin/Lovenox Indication: atrial fibrillation--mechanical mitral Valve  Allergies  Allergen Reactions  . Codeine Nausea Only    Patient Measurements: Height: 5\' 7"  (170.2 cm) Weight: 203 lb (92.1 kg) IBW/kg (Calculated) : 61.6 Heparin Dosing Weight:   Vital Signs: Temp: 98.1 F (36.7 C) (08/03 0858) Temp Source: Oral (08/03 0858) BP: 133/75 (08/03 0858) Pulse Rate: 70 (08/03 0858)  Labs: Recent Labs    09/02/17 1618 09/02/17 2311 09/03/17 0739 09/04/17 0415 09/05/17 0612  HGB  --   --  9.7* 9.5*  --   HCT  --   --  32.3* 31.8*  --   PLT  --   --  158 178  --   LABPROT  --   --  21.6* 24.7* 28.3*  INR  --   --  1.89 2.25 2.68  HEPARINUNFRC 0.20* 0.24* 0.67  --   --   CREATININE  --   --  0.89 1.02*  --     Estimated Creatinine Clearance: 65.8 mL/min (A) (by C-G formula based on SCr of 1.02 mg/dL (H)).   Medications:  Scheduled:  . ALPRAZolam  0.5 mg Oral Daily  . amiodarone  200 mg Oral Daily  . atorvastatin  20 mg Oral q1800  . carvedilol  12.5 mg Oral Q breakfast  . carvedilol  25 mg Oral Q supper  . clopidogrel  75 mg Oral Daily  . enoxaparin (LOVENOX) injection  90 mg Subcutaneous Q12H  . fenofibrate  160 mg Oral Daily  . FLUoxetine  40 mg Oral QHS  . fluticasone  2 puff Inhalation BID  . furosemide  40 mg Oral Daily  . guaiFENesin  1,200 mg Oral BID  . ipratropium-albuterol  3 mL Nebulization TID  . levothyroxine  200 mcg Oral QAC breakfast  . lisinopril  40 mg Oral Daily  . potassium chloride  20 mEq Oral BID  . [START ON 09/07/2017] predniSONE  30 mg Oral Q breakfast   Followed by  . [START ON 09/09/2017] predniSONE  20 mg Oral Q breakfast   Followed by  . [START ON 09/11/2017] predniSONE  10 mg Oral Q breakfast  . predniSONE  40 mg Oral Q breakfast  . venlafaxine XR  37.5 mg Oral Q breakfast  . [START ON 09/06/2017] warfarin  1 mg Oral Once per day on Sun Wed  . warfarin  2 mg  Oral Once per day on Mon Tue Thu Fri Sat  . Warfarin - Physician Dosing Inpatient   Does not apply q1800   PRN: acetaminophen, albuterol, alum & mag hydroxide-simeth, hydrOXYzine, magnesium hydroxide, traZODone  Assessment: Pharmacy consulted to dose heparin for patient with mechanical mitral valve. Patient is also taking coumadin. Patient came in after falling at home with INR elevated >10. Vitamin K 5 mg IV was given and now INR subtherapeutic but trending upward.  Per anti-coag notes, patient takes 17.5 mg/week of coumadin. Patient will be slow to return to therapeutic range with Vitamin K administration. Transition heparin to lovenox. Bridge with lovenox until INR therapeutic      Goal of Therapy:  INR 2.5-3.5 Monitor platelets by anticoagulation protocol: Yes   Plan:  Warfarin 1.5mg  x 1 dose Discontinue Lovenox as INR =2.68 today Continue to monitor H&H and Plts Monitor PT-INR daily and s/s of bleeding  Gypsy Decant 09/05/2017,1:36 PM

## 2017-09-05 NOTE — BHH Group Notes (Signed)
LCSW Group Therapy Note  09/05/2017   10:00--11:00am   Type of Therapy and Topic:  Group Therapy: Anger Cues and Responses  Participation Level:  Did Not Attend   Description of Group:   In this group, patients learned how to recognize the physical, cognitive, emotional, and behavioral responses they have to anger-provoking situations.  They identified a recent time they became angry and how they reacted.  They analyzed how their reaction was possibly beneficial and how it was possibly unhelpful.  The group discussed a variety of healthier coping skills that could help with such a situation in the future.  Deep breathing was practiced briefly.  Therapeutic Goals: 1. Patients will remember their last incident of anger and how they felt emotionally and physically, what their thoughts were at the time, and how they behaved. 2. Patients will identify how their behavior at that time worked for them, as well as how it worked against them. 3. Patients will explore possible new behaviors to use in future anger situations. 4. Patients will learn that anger itself is normal and cannot be eliminated, and that healthier reactions can assist with resolving conflict rather than worsening situations.  Summary of Patient Progress:  Did not attend  Therapeutic Modalities:   Cognitive Behavioral Therapy  Rolanda Jay LCSW

## 2017-09-05 NOTE — BHH Suicide Risk Assessment (Signed)
[facility] Admission Suicide Risk Assessment   Nursing information obtained from:  Patient Demographic factors:  Divorced or widowed, Caucasian, Unemployed Current Mental Status:  Suicidal ideation indicated by others Loss Factors:  Loss of significant relationship Historical Factors:  Family history of mental illness or substance abuse, Impulsivity Risk Reduction Factors:  Responsible for children under 63 years of age, Living with another person, especially a relative, Sense of responsibility to family, Positive social support, Religious beliefs about death, Positive therapeutic relationship  Total Time spent with patient: 30 minutes Principal Problem: <principal problem not specified> Diagnosis:   Patient Active Problem List   Diagnosis Date Noted  . 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 Elevated INR [R79.1] 08/31/2017  . Syncope [R55] 08/31/2017  . Anticoagulant overdosage [T45.511A] 08/31/2017  . Wolff-Parkinson-White syndrome [I45.6] 08/31/2017  . COPD (chronic obstructive pulmonary disease) ([place]) [J44.9] 08/31/2017  . Pleuritic chest pain [R07.81] 08/31/2017  . Former smoker [C58.850] 08/31/2017  . Anxiety [F41.9]   . Depression [F32.9]   . Hypertension [I10]   . Hyperlipidemia [E78.5]   . Thyroid disease [E07.9]   . Other abnormal glucose [R73.09]   . Automatic implantable cardiac defibrillator in situ [Z95.810]   . Atrial fibrillation ([place]) [I48.91]   . Other left bundle branch block [I44.7]   . 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 Hypersomnia, unspecified [G47.10]   . Lump or mass in breast [N63.0]   . Unspecified vitamin D deficiency [E55.9]   . Thyroid cancer ([place]) [C73]    Subjective Data: Patient is seen and examined.  Patient is a 62 year old female with a past psychiatric history significant for depression and generalized anxiety who presented to the [facility] emergency department on  08/31/2017 after an intentional overdose of Xanax.  The patient stated that she is been treated for depression and anxiety with Prozac, Zoloft and Xanax for multiple years.  She stated that most recently her Prozac was increased to 60 mg by her primary care provider.  She stated that she is the primary caretaker for 2 of her grandchildren.  Her 73 year old grandchild recently moved in with her.  Her daughter sounds like she has some issues with mental health problems as well as substances.  She got into an argument with her daughter, and was upset by this.  She took the overdose of Xanax.  Apparently she took approximately 31 mg Xanax tablets.  She was hospitalized in the medical side of the hospital due to this.  There were also the presence of amphetamines in her system.  She stated that 1 of her grandchildren takes stimulants for attention deficit issues, but she does not recall taking that medication.  She was hospitalized at [facility], stabilized, and then transferred to our hospital for psychiatric evaluation.  She has a significant past medical history for mitral valve replacement, atrial fibrillation, hypothyroidism status post thyroid cancer, and implanted defibrillator and pacemaker, COPD, hypertension, chronic anticoagulation, Wolff-Parkinson-White syndrome.  She stated that this was a mistake, and she feels bad about it she is remorseful.  She did state that even prior to the event the Prozac was really not helping her depression.  She was admitted to the hospital for evaluation and stabilization.  Continued Clinical Symptoms:  Alcohol Use Disorder Identification Test Final Score (AUDIT): 0 The "Alcohol Use Disorders Identification Test", Guidelines for Use in Primary Care, Second Edition.  World Pharmacologist [facility]). Score between 0-7:  no or low risk or  alcohol related problems. Score between 8-15:  moderate risk of alcohol related problems. Score between 16-19:  high risk of alcohol  related problems. Score 20 or above:  warrants further diagnostic evaluation for alcohol dependence and treatment.   CLINICAL FACTORS:   Severe Anxiety and/or Agitation Depression:   Anhedonia Hopelessness Impulsivity Insomnia   Musculoskeletal: Strength & Muscle Tone: within normal limits Gait & Station: normal Patient leans: N/A  Psychiatric Specialty Exam: Physical Exam  Nursing note and vitals reviewed. Constitutional: She is oriented to person, place, and time. She appears well-developed and well-nourished.  HENT:  Head: Normocephalic and atraumatic.  Respiratory: Effort normal.  Neurological: She is alert and oriented to person, place, and time.    ROS  Blood pressure 131/79, pulse 70, temperature 97.9 F (36.6 C), temperature source Oral, resp. rate 18, height 5\' 7"  (1.702 m), weight 92.1 kg (203 lb), SpO2 97 %.Body mass index is 31.79 kg/m.  General Appearance: Disheveled  Eye Contact:  Fair  Speech:  Slow  Volume:  Decreased  Mood:  Depressed  Affect:  Congruent  Thought Process:  Coherent  Orientation:  Full (Time, Place, and Person)  Thought Content:  Logical  Suicidal Thoughts:  No  Homicidal Thoughts:  No  Memory:  Immediate;   Fair Recent;   Fair Remote;   Fair  Judgement:  Intact  Insight:  Lacking  Psychomotor Activity:  Decreased and Psychomotor Retardation  Concentration:  Concentration: Fair and Attention Span: Fair  Recall:  [employer] of Knowledge:  Fair  Language:  Fair  Akathisia:  Negative  Handed:  Right  AIMS (if indicated):     Assets:  Desire for Improvement Housing Resilience Talents/Skills  ADL's:  Intact  Cognition:  WNL  Sleep:  Number of Hours: 5.5      COGNITIVE FEATURES THAT CONTRIBUTE TO RISK:  None    SUICIDE RISK:   Mild:  Suicidal ideation of limited frequency, intensity, duration, and specificity.  There are no identifiable plans, no associated intent, mild dysphoria and related symptoms, good self-control  (both objective and subjective assessment), few other risk factors, and identifiable protective factors, including available and accessible social support.  PLAN OF CARE: Patient is seen and examined.  Patient is a 62 year old female with the above-stated past medical and psychiatric history who was transferred to our facility after an intentional overdose of Xanax and stimulants from her grandson.  She is remorseful of the event.  She admits to worsening depression even before the event.  She has been treated with fluoxetine, and that was recently increased to 60 mg a day.  Previously she had been on Zoloft.  She states she been on Xanax for 20 years.  She stated she is only taking 1 mg a day on average.  I am going to reduce her fluoxetine to 40 mg a day, and wean it off over the next couple of days.  I am going to reduce her Xanax to 0.5 mg p.o. daily, and then slowly stop that.  I have written for Effexor XR 37.5 mg p.o. daily starting today.  We will continue her medications for atrial fibrillation, hyperlipidemia, hypertension, chronic anticoagulation as well as her COPD.  She also has a history of hypothyroidism, and we will continue her previous levothyroxine dosage.  She is currently on prednisone because of his COPD exacerbation per the medical staff at [facility].  This will be continued over the next several days.  She will be admitted to the  hospital.  She will be integrated into the milieu.  She will be encouraged to attend groups.  She will see social work both individually and in groups.  She will be placed on 15-minute checks for safety.  On physical examination today her lungs are clear, and she is in sinus rhythm and you can hear the mechanical valve murmur.  I certify that inpatient services furnished can reasonably be expected to improve the patient's condition.   Sharma Covert, MD 09/05/2017, 8:46 AM

## 2017-09-05 NOTE — Progress Notes (Signed)
Patient ID: Ana Martin, female   DOB: [birth year], 62 y.o.   MRN: [id number]   D: Pt has been flat and depressed on the unit today. Pt reported that she made a mistake and that she just wanted to get back to her grandchildren. Pt reported that her depression was a 8, her hopelessness was a 5, and that her anxiety was a 0. Pt reported that her goal for today was to get control of life and go home. Pt had skin tears to left arm and finger, dressing to both were changed. Pt was due to get Lovenox shot today, Dr. Mallie Darting told this writer to hold for today. Pt reported being negative SI/HI, no AH/VH noted. A: 15 min checks continued for patient safety. R: Pt safety maintained.

## 2017-09-06 LAB — PROTIME-INR
INR: 3.09
PROTHROMBIN TIME: 31.6 s — AB (ref 11.4–15.2)

## 2017-09-06 MED ORDER — IBUPROFEN 800 MG PO TABS
800.0000 mg | ORAL_TABLET | Freq: Once | ORAL | Status: DC
  Filled 2017-09-06: qty 1

## 2017-09-06 MED ORDER — ACETAMINOPHEN 500 MG PO TABS
1000.0000 mg | ORAL_TABLET | Freq: Four times a day (QID) | ORAL | Status: DC | PRN
  Administered 2017-09-06 – 2017-09-09 (×2): 1000 mg via ORAL
  Filled 2017-09-06 (×2): qty 2

## 2017-09-06 MED ORDER — VENLAFAXINE HCL ER 75 MG PO CP24
75.0000 mg | ORAL_CAPSULE | Freq: Every day | ORAL | Status: DC
  Administered 2017-09-07 – 2017-09-10 (×4): 75 mg via ORAL
  Filled 2017-09-06 (×6): qty 1

## 2017-09-06 MED ORDER — FLUOXETINE HCL 20 MG PO CAPS
20.0000 mg | ORAL_CAPSULE | Freq: Every day | ORAL | Status: DC
  Administered 2017-09-06: 20 mg via ORAL
  Filled 2017-09-06 (×2): qty 1

## 2017-09-06 MED ORDER — WARFARIN SODIUM 1 MG PO TABS
1.0000 mg | ORAL_TABLET | Freq: Once | ORAL | Status: AC
  Administered 2017-09-06: 1 mg via ORAL
  Filled 2017-09-06 (×2): qty 1

## 2017-09-06 NOTE — BHH Group Notes (Signed)
[facility] Group Notes:  (Nursing/MHT/Case Management/Adjunct)  Date:  09/06/2017  Time:  1:43 PM  Type of Therapy:  Psychoeducational Skills  Participation Level:  Active  Participation Quality:  Appropriate  Affect:  Appropriate  Cognitive:  Appropriate  Insight:  Appropriate  Engagement in Group:  Engaged  Modes of Intervention:  Problem-solving  Summary of Progress/Problems: Pt attended Psychoeducational group with top topic healthy support systems.  Ana Martin [name] 09/06/2017, 1:43 PM

## 2017-09-06 NOTE — Progress Notes (Signed)
ANTICOAGULATION CONSULT NOTE - Follow Up Consult  Pharmacy Consult for Warfarin  Indication: atrial fibrillation--mechanical mitral valve  Allergies  Allergen Reactions  . Codeine Nausea Only    Patient Measurements: Height: 5\' 7"  (170.2 cm) Weight: 203 lb (92.1 kg) IBW/kg (Calculated) : 61.6 Heparin Dosing Weight:   Vital Signs:    Labs: Recent Labs    09/04/17 0415 09/05/17 0612 09/06/17 0619  HGB 9.5*  --   --   HCT 31.8*  --   --   PLT 178  --   --   LABPROT 24.7* 28.3* 31.6*  INR 2.25 2.68 3.09  CREATININE 1.02*  --   --     Estimated Creatinine Clearance: 65.8 mL/min (A) (by C-G formula based on SCr of 1.02 mg/dL (H)).   Medications:  Scheduled:  . ALPRAZolam  0.5 mg Oral Daily  . amiodarone  200 mg Oral Daily  . atorvastatin  20 mg Oral q1800  . carvedilol  12.5 mg Oral Q breakfast  . carvedilol  25 mg Oral Q supper  . clopidogrel  75 mg Oral Daily  . fenofibrate  160 mg Oral Daily  . FLUoxetine  40 mg Oral QHS  . fluticasone  2 puff Inhalation BID  . furosemide  40 mg Oral Daily  . guaiFENesin  1,200 mg Oral BID  . ipratropium-albuterol  3 mL Nebulization TID  . levothyroxine  200 mcg Oral QAC breakfast  . lisinopril  40 mg Oral Daily  . potassium chloride  20 mEq Oral BID  . [START ON 09/07/2017] predniSONE  30 mg Oral Q breakfast   Followed by  . [START ON 09/09/2017] predniSONE  20 mg Oral Q breakfast   Followed by  . [START ON 09/11/2017] predniSONE  10 mg Oral Q breakfast  . predniSONE  40 mg Oral Q breakfast  . venlafaxine XR  37.5 mg Oral Q breakfast  . Warfarin - Pharmacist Dosing Inpatient   Does not apply q1800    Assessment: 62 yo with hx mechanical mitral valve on chronic coumadin PT=3.09  (started on 8/1 on 3mg /2.5/1.5 after having received vitamin K administration then Lovenox bridging) Goal of Therapy:  INR 2.5-3.5 Monitor platelets by anticoagulation protocol: Yes   Plan:  Warfarin 1mg  X1 at 1800 Continue to monitor H&H and  Plts Continue PT-INR daily and s/s of bleeding.   Gypsy Decant 09/06/2017,9:28 AM

## 2017-09-06 NOTE — BHH Group Notes (Signed)
[facility] LCSW Group Therapy Note  Date/Time:  09/06/2017 10:00-11:00AM  Type of Therapy and Topic:  Group Therapy:  Healthy and Unhealthy Supports  Participation Level:  Did Not Attend   Description of Group:  Patients in this group were introduced to the idea of adding a variety of healthy supports to address the various needs in their lives.Patients discussed what additional healthy supports could be helpful in their recovery and wellness after discharge in order to prevent future hospitalizations.   An emphasis was placed on using counselor, doctor, therapy groups, 12-step groups, and problem-specific support groups to expand supports.  They also worked as a group on developing a specific plan for several patients to deal with unhealthy supports through [place], psychoeducation with loved ones, and even termination of relationships.   Therapeutic Goals:   1)  discuss importance of adding supports to stay well once out of the hospital  2)  compare healthy versus unhealthy supports and identify some examples of each  3)  generate ideas and descriptions of healthy supports that can be added  4)  offer mutual support about how to address unhealthy supports  5)  encourage active participation in and adherence to discharge plan    Summary of Patient Progress:  Did not attend   Therapeutic Modalities:   Motivational Interviewing Brief Solution-Focused Therapy  Rolanda Jay

## 2017-09-06 NOTE — Plan of Care (Signed)
D: Pt denies SI/HI/AVH. Pt is pleasant and cooperative. Pt stated she was doing about the same, pt stated she slept a little last night. Pt visible on the unit most of the evening.   A: Pt was offered support and encouragement. Pt was given scheduled medications. Pt was encourage to attend groups. Q 15 minute checks were done for safety.   R:Pt attends groups and interacts well with peers and staff. Pt is taking medication. Pt receptive to treatment and safety maintained on unit.   Problem: Safety: Goal: Periods of time without injury will increase Outcome: Progressing   Problem: Safety: Goal: Ability to disclose and discuss suicidal ideas will improve Outcome: Progressing

## 2017-09-06 NOTE — Progress Notes (Signed)
Patient ID: Ana Martin, female   DOB: May 20, 1955, 62 y.o.   MRN: [id number]    D: Pt has been flat and depressed on the unit today. Pt reported that she made a mistake and that she just wanted to get back to her grandchildren. Pt reported that her depression was a 6, her hopelessness was a 5, and that her anxiety was a 0. Pt reported that her goal for today was to get ready to go home.  Pt reported being negative SI/HI, no AH/VH noted. A: 15 min checks continued for patient safety. R: Pt safety maintained.

## 2017-09-06 NOTE — Progress Notes (Signed)
[facility] MD Progress Note  09/06/2017 10:10 AM Ana Martin  MRN:  [id number] Subjective: Patient is seen and examined.  Patient is a 62 year old female with a past psychiatric history significant for major depression; severe.  She is seen in follow-up.  She states she feels little bit better today.  Her energy levels are improved.  She denied any problems with the reduction in the Prozac or the introduction of the Effexor XR.  She denied any suicidal ideation.  She stated that her grandchildren will be out of the home when she is discharged.  She stated to be her and her little dog, and "I did well like that before".  Her INR is still at 3.09.  Pharmacy is handling her Coumadin dosage.  We discussed continued changes to the fluoxetine and the Effexor XR.  Her vital signs are stable, she is afebrile. Principal Problem: <principal problem not specified> Diagnosis:   Patient Active Problem List   Diagnosis Date Noted  . MDD (major depressive disorder), recurrent severe, without psychosis ([place]) [F33.2] 09/04/2017  . Elevated INR [R79.1] 08/31/2017  . Syncope [R55] 08/31/2017  . Anticoagulant overdosage [T45.511A] 08/31/2017  . Wolff-Parkinson-White syndrome [I45.6] 08/31/2017  . COPD (chronic obstructive pulmonary disease) ([place]) [J44.9] 08/31/2017  . Pleuritic chest pain [R07.81] 08/31/2017  . Former smoker [N98.921] 08/31/2017  . Anxiety [F41.9]   . Depression [F32.9]   . Hypertension [I10]   . Hyperlipidemia [E78.5]   . Thyroid disease [E07.9]   . Other abnormal glucose [R73.09]   . Automatic implantable cardiac defibrillator in situ [Z95.810]   . Atrial fibrillation ([place]) [I48.91]   . Other left bundle branch block [I44.7]   . 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 Hypersomnia, unspecified [G47.10]   . Lump or mass in breast [N63.0]   . Unspecified vitamin D deficiency [E55.9]   . Thyroid cancer ([place]) [C73]    Total Time spent with patient: 15 minutes  Past Psychiatric History:  See admission H&P  Past Medical History:  Past Medical History:  Diagnosis Date  . Anxiety   . Atrial fibrillation ([place])   . Automatic implantable cardiac defibrillator in situ    [place] DR-T 09-20-2007 Dr.Akbary  . Depression   . Hyperlipidemia   . 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 Hypertension   . Lump or mass in breast    at the 7 o'clock position of right breast(non-tender)  . Other abnormal glucose   . Other left bundle branch block   . Other primary cardiomyopathies   . Thyroid cancer ([place])   . Thyroid disease   . Unspecified vitamin D deficiency   . Wolff-Parkinson-White syndrome     Past Surgical History:  Procedure Laterality Date  . ABDOMINAL HYSTERECTOMY    . CARDIAC VALVE REPLACEMENT     st jude silzone mitray mechanical [id number] ss# [id number]  . COLONOSCOPY     April 12, 2008  . PACEMAKER PLACEMENT     Family History:  Family History  Problem Relation Age of Onset  . Lung cancer Father        expired 56  . Sarcoidosis Mother        espired 2013   Family Psychiatric  History: See admission H&P Social History:  Social History   Substance and Sexual Activity  Alcohol Use No     Social History   Substance and Sexual Activity  Drug Use No    Social History   Socioeconomic History  .  Marital status: Legally Separated    Spouse name: Not on file  . Number of children: Not on file  . Years of education: Not on file  . Highest education level: Not on file  Occupational History  . Not on file  Social Needs  . Financial resource strain: Not on file  . Food insecurity:    Worry: Not on file    Inability: Not on file  . Transportation needs:    Medical: Not on file    Non-medical: Not on file  Tobacco Use  . Smoking status: Former Smoker    Packs/day: 1.00    Years: 25.00    Pack years: 25.00    Types: Cigarettes    Last attempt to quit: 10/17/2009    Years since quitting: 7.8  . Smokeless tobacco: Never Used   Substance and Sexual Activity  . Alcohol use: No  . Drug use: No  . Sexual activity: Not Currently  Lifestyle  . Physical activity:    Days per week: Not on file    Minutes per session: Not on file  . Stress: Not on file  Relationships  . Social connections:    Talks on phone: Not on file    Gets together: Not on file    Attends religious service: Not on file    Active member of club or organization: Not on file    Attends meetings of clubs or organizations: Not on file    Relationship status: Not on file  Other Topics Concern  . Not on file  Social History Narrative  . Not on file   Additional Social History:                         Sleep: Good  Appetite:  Fair  Current Medications: Current Facility-Administered Medications  Medication Dose Route Frequency Provider Last Rate Last Dose  . acetaminophen (TYLENOL) tablet 650 mg  650 mg Oral Q6H PRN Money, Lowry Ram, FNP   650 mg at 09/05/17 1607  . albuterol (PROVENTIL HFA;VENTOLIN HFA) 108 (90 Base) MCG/ACT inhaler 1-2 puff  1-2 puff Inhalation Q6H PRN Laverle Hobby, PA-C      . ALPRAZolam Duanne Moron) tablet 0.5 mg  0.5 mg Oral Daily Sharma Covert, MD   0.5 mg at 09/05/17 9528  . alum & mag hydroxide-simeth (MAALOX/MYLANTA) 200-200-20 MG/5ML suspension 30 mL  30 mL Oral Q4H PRN Money, Lowry Ram, FNP      . amiodarone (PACERONE) tablet 200 mg  200 mg Oral Daily Money, Lowry Ram, FNP   200 mg at 09/05/17 4132  . atorvastatin (LIPITOR) tablet 20 mg  20 mg Oral q1800 Money, Lowry Ram, FNP   20 mg at 09/05/17 1717  . carvedilol (COREG) tablet 12.5 mg  12.5 mg Oral Q breakfast Money, Darnelle Maffucci B, FNP   12.5 mg at 09/05/17 0924  . carvedilol (COREG) tablet 25 mg  25 mg Oral Q supper Money, Lowry Ram, FNP      . clopidogrel (PLAVIX) tablet 75 mg  75 mg Oral Daily Money, Lowry Ram, FNP   75 mg at 09/05/17 4401  . fenofibrate tablet 160 mg  160 mg Oral Daily Money, Lowry Ram, FNP   160 mg at 09/05/17 0272  . FLUoxetine (PROZAC)  capsule 20 mg  20 mg Oral QHS Sharma Covert, MD      . fluticasone (FLOVENT HFA) 44 MCG/ACT inhaler 2 puff  2 puff Inhalation BID Gilberto Better,  Maurine Minister, PA-C   2 puff at 09/05/17 2141  . furosemide (LASIX) tablet 40 mg  40 mg Oral Daily Money, Lowry Ram, FNP   40 mg at 09/05/17 0924  . guaiFENesin (MUCINEX) 12 hr tablet 1,200 mg  1,200 mg Oral BID Money, Lowry Ram, FNP   1,200 mg at 09/05/17 1717  . hydrOXYzine (ATARAX/VISTARIL) tablet 25 mg  25 mg Oral TID PRN Money, Lowry Ram, FNP   25 mg at 09/05/17 2108  . ipratropium-albuterol (DUONEB) 0.5-2.5 (3) MG/3ML nebulizer solution 3 mL  3 mL Nebulization TID Money, Darnelle Maffucci B, FNP      . levothyroxine (SYNTHROID, LEVOTHROID) tablet 200 mcg  200 mcg Oral QAC breakfast Money, Lowry Ram, FNP   200 mcg at 09/06/17 2778  . lisinopril (PRINIVIL,ZESTRIL) tablet 40 mg  40 mg Oral Daily Laverle Hobby, PA-C   40 mg at 09/05/17 2423  . magnesium hydroxide (MILK OF MAGNESIA) suspension 30 mL  30 mL Oral Daily PRN Money, Lowry Ram, FNP      . potassium chloride SA (K-DUR,KLOR-CON) CR tablet 20 mEq  20 mEq Oral BID Laverle Hobby, PA-C   20 mEq at 09/05/17 1717  . [START ON 09/07/2017] predniSONE (DELTASONE) tablet 30 mg  30 mg Oral Q breakfast Sharma Covert, MD       Followed by  . [START ON 09/09/2017] predniSONE (DELTASONE) tablet 20 mg  20 mg Oral Q breakfast Sharma Covert, MD       Followed by  . [START ON 09/11/2017] predniSONE (DELTASONE) tablet 10 mg  10 mg Oral Q breakfast Sharma Covert, MD      . predniSONE (DELTASONE) tablet 40 mg  40 mg Oral Q breakfast Laverle Hobby, PA-C   40 mg at 09/05/17 5361  . traZODone (DESYREL) tablet 50 mg  50 mg Oral QHS PRN Money, Lowry Ram, FNP   50 mg at 09/05/17 2108  . [START ON 09/07/2017] venlafaxine XR (EFFEXOR-XR) 24 hr capsule 75 mg  75 mg Oral Q breakfast Mallie Darting, Cordie Grice, MD      . Warfarin - Pharmacist Dosing Inpatient   Does not apply q1800 Sharma Covert, MD        Lab Results:  Results for  orders placed or performed during the hospital encounter of 09/04/17 (from the past 48 hour(s))  TSH     Status: None   Collection Time: 09/05/17  6:12 AM  Result Value Ref Range   TSH 4.139 0.350 - 4.500 uIU/mL    Comment: Performed by a 3rd Generation assay with a functional sensitivity of <=0.01 uIU/mL. Performed at [facility], [place] [address], [place], [place] [address]   Protime-INR     Status: Abnormal   Collection Time: 09/05/17  6:12 AM  Result Value Ref Range   Prothrombin Time 28.3 (H) 11.4 - 15.2 seconds   INR 2.68     Comment: Performed at [facility], [place] [address] [place], [place] [address]  Protime-INR     Status: Abnormal   Collection Time: 09/06/17  6:19 AM  Result Value Ref Range   Prothrombin Time 31.6 (H) 11.4 - 15.2 seconds   INR 3.09     Comment: Performed at [facility], [place] [address], [place], [place] [address]    Blood Alcohol level:  Lab Results  Component Value Date   ETH <10 09/32/6712    Metabolic Disorder Labs: No results found for: HGBA1C, MPG  No results found for: PROLACTIN No results found for: CHOL, TRIG, HDL, CHOLHDL, VLDL, LDLCALC  Physical Findings: AIMS: Facial and Oral Movements Muscles of Facial Expression: None, normal Lips and Perioral Area: None, normal Jaw: None, normal Tongue: None, normal,Extremity Movements Upper (arms, wrists, hands, fingers): None, normal Lower (legs, knees, ankles, toes): None, normal, Trunk Movements Neck, shoulders, hips: None, normal, Overall Severity Severity of abnormal movements (highest score from questions above): None, normal Incapacitation due to abnormal movements: None, normal Patient's awareness of abnormal movements (rate only patient's report): No Awareness, Dental Status Current problems with teeth and/or dentures?: No Does patient usually wear dentures?: No  CIWA:    COWS:     Musculoskeletal: Strength & Muscle Tone:  within normal limits Gait & Station: normal Patient leans: N/A  Psychiatric Specialty Exam: Physical Exam  Nursing note and vitals reviewed. Constitutional: She is oriented to person, place, and time. She appears well-developed and well-nourished.  HENT:  Head: Normocephalic and atraumatic.  Respiratory: Effort normal.  Neurological: She is alert and oriented to person, place, and time.    ROS  Blood pressure 133/75, pulse 70, temperature 98.1 F (36.7 C), temperature source Oral, resp. rate 18, height 5\' 7"  (1.702 m), weight 92.1 kg (203 lb), SpO2 97 %.Body mass index is 31.79 kg/m.  General Appearance: Casual  Eye Contact:  Fair  Speech:  Slow  Volume:  Normal  Mood:  Anxious and Depressed  Affect:  Congruent  Thought Process:  Coherent  Orientation:  Full (Time, Place, and Person)  Thought Content:  Logical  Suicidal Thoughts:  No  Homicidal Thoughts:  No  Memory:  Immediate;   Fair Recent;   Fair Remote;   Fair  Judgement:  Intact  Insight:  Fair  Psychomotor Activity:  Psychomotor Retardation  Concentration:  Concentration: Fair and Attention Span: Fair  Recall:  [employer] of Knowledge:  Fair  Language:  Fair  Akathisia:  Negative  Handed:  Right  AIMS (if indicated):     Assets:  Desire for Improvement Housing Resilience  ADL's:  Intact  Cognition:  WNL  Sleep:  Number of Hours: 6     Treatment Plan Summary: Daily contact with patient to assess and evaluate symptoms and progress in treatment, Medication management and Plan : Patient is seen and examined.  Patient is a 62 year old female with the above-stated past psychiatric history seen in follow-up.  Her mood is improving.  Her energy levels are improving.  I will continue to decrease her fluoxetine while we wean it off.  I will decrease it to 20 mg p.o. daily today, and stop it tomorrow.  I will increase her Effexor XR to 75 mg p.o. daily today.  I will hold her Xanax wean at 0.5 mg p.o. daily for 1 more  day.  Her INR this morning is still at 3.09.  Pharmacy is handling the adjustment of her Coumadin dosage.  She remains on steroids for COPD, and she stated she is coughing up material more easily.  Her lungs are clear today but with decreased breath sounds.  Her heart rate is decreased down to 70, and she is in sinus rhythm today.  No other changes to her medications.  Sharma Covert, MD 09/06/2017, 10:10 AM

## 2017-09-07 LAB — PROTIME-INR
INR: 2.96
Prothrombin Time: 30.6 seconds — ABNORMAL HIGH (ref 11.4–15.2)

## 2017-09-07 MED ORDER — PREDNISONE 20 MG PO TABS
20.0000 mg | ORAL_TABLET | Freq: Every day | ORAL | Status: AC
  Administered 2017-09-08: 20 mg via ORAL
  Filled 2017-09-07: qty 1

## 2017-09-07 MED ORDER — PREDNISONE 10 MG PO TABS
10.0000 mg | ORAL_TABLET | Freq: Every day | ORAL | Status: AC
  Administered 2017-09-09: 10 mg via ORAL
  Filled 2017-09-07: qty 1

## 2017-09-07 MED ORDER — ALPRAZOLAM 0.5 MG PO TABS: 0.5000 mg | ORAL_TABLET | Freq: Every day | ORAL | Status: DC | PRN

## 2017-09-07 MED ORDER — WARFARIN SODIUM 1 MG PO TABS
2.0000 mg | ORAL_TABLET | Freq: Once | ORAL | Status: AC
  Administered 2017-09-07: 2 mg via ORAL
  Filled 2017-09-07: qty 2

## 2017-09-07 MED ORDER — DOXEPIN HCL 25 MG PO CAPS
25.0000 mg | ORAL_CAPSULE | Freq: Every day | ORAL | Status: DC
  Administered 2017-09-07: 25 mg via ORAL
  Filled 2017-09-07 (×2): qty 1

## 2017-09-07 NOTE — Plan of Care (Signed)
Nurse discussed anxiety, depression, coping skills with patient. 

## 2017-09-07 NOTE — Progress Notes (Addendum)
ANTICOAGULATION CONSULT NOTE - Follow Up Consult  Pharmacy Consult for Warfarin Indication: atrial fibrillation--mechanical mitral valve  Allergies  Allergen Reactions  . Codeine Nausea Only    Patient Measurements: Height: 5\' 7"  (170.2 cm) Weight: 203 lb (92.1 kg) IBW/kg (Calculated) : 61.6 Heparin Dosing Weight:   Vital Signs: Temp: 98.6 F (37 C) (08/05 0836) Temp Source: Oral (08/05 0836) BP: 131/61 (08/05 0838) Pulse Rate: 70 (08/05 0838)  Labs: Recent Labs    09/05/17 0612 09/06/17 0619 09/07/17 0640  LABPROT 28.3* 31.6* 30.6*  INR 2.68 3.09 2.96    Estimated Creatinine Clearance: 65.8 mL/min (A) (by C-G formula based on SCr of 1.02 mg/dL (H)).   Medications:  Scheduled:  . amiodarone  200 mg Oral Daily  . atorvastatin  20 mg Oral q1800  . carvedilol  12.5 mg Oral Q breakfast  . carvedilol  25 mg Oral Q supper  . clopidogrel  75 mg Oral Daily  . doxepin  25 mg Oral QHS  . fenofibrate  160 mg Oral Daily  . fluticasone  2 puff Inhalation BID  . furosemide  40 mg Oral Daily  . guaiFENesin  1,200 mg Oral BID  . ibuprofen  800 mg Oral Once  . ipratropium-albuterol  3 mL Nebulization TID  . levothyroxine  200 mcg Oral QAC breakfast  . lisinopril  40 mg Oral Daily  . [START ON 09/09/2017] predniSONE  10 mg Oral Q breakfast  . [START ON 09/08/2017] predniSONE  20 mg Oral Q breakfast  . venlafaxine XR  75 mg Oral Q breakfast  . warfarin  2 mg Oral ONCE-1800  . Warfarin - Pharmacist Dosing Inpatient   Does not apply q1800   PRN: acetaminophen, albuterol, ALPRAZolam, alum & mag hydroxide-simeth, hydrOXYzine, magnesium hydroxide  Assessment: PT= 30.6 INR= 1.96 Goal of Therapy:  INR 2.5-3.5 Monitor platelets by anticoagulation protocol: Yes   Plan:  Warfarin 2mg  x1 at 1800 Continue to monitor H&H and plts PT-INR in AM  Gypsy Decant 09/07/2017,2:11 PM   INR = 2.96 on 09/07/17 within desired range

## 2017-09-07 NOTE — [facility] Group Notes (Signed)
[facility] LCSW Group Therapy Note  Date/Time:09/07/17, 1315  Type of Therapy and Topic:  Group Therapy:  Overcoming Obstacles  Participation Level:  active  Description of Group:    In this group patients will be encouraged to explore what they see as obstacles to their own wellness and recovery. They will be guided to discuss their thoughts, feelings, and behaviors related to these obstacles. The group will process together ways to cope with barriers, with attention given to specific choices patients can make. Each patient will be challenged to identify changes they are motivated to make in order to overcome their obstacles. This group will be process-oriented, with patients participating in exploration of their own experiences as well as giving and receiving support and challenge from other group members.  Therapeutic Goals: 1. Patient will identify personal and current obstacles as they relate to admission. 2. Patient will identify barriers that currently interfere with their wellness or overcoming obstacles.  3. Patient will identify feelings, thought process and behaviors related to these barriers. 4. Patient will identify two changes they are willing to make to overcome these obstacles:    Summary of Patient Progress: Pt identified her current obstacles as her relationship with her daughter and her need to parent her grandchildren.  Pt active during group discussion regarding positive ways to overcome obstacles.      Therapeutic Modalities:   Cognitive Behavioral Therapy Solution Focused Therapy Motivational Interviewing Relapse Prevention Therapy  Lurline Idol, LCSW

## 2017-09-07 NOTE — Tx Team (Signed)
Interdisciplinary Treatment and Diagnostic Plan Update  09/07/2017 Time of Session: 10:00am Ana Martin MRN: [id number]  Principal Diagnosis: <principal problem not specified>  Secondary Diagnoses: Active Problems:   MDD (major depressive disorder), recurrent severe, without psychosis ([place])   Current Medications:  Current Facility-Administered Medications  Medication Dose Route Frequency Provider Last Rate Last Dose  . acetaminophen (TYLENOL) tablet 1,000 mg  1,000 mg Oral Q6H PRN Derrill Center, NP   1,000 mg at 09/06/17 1814  . albuterol (PROVENTIL HFA;VENTOLIN HFA) 108 (90 Base) MCG/ACT inhaler 1-2 puff  1-2 puff Inhalation Q6H PRN Laverle Hobby, PA-C      . ALPRAZolam Duanne Moron) tablet 0.5 mg  0.5 mg Oral Daily Sharma Covert, MD   0.5 mg at 09/07/17 0839  . alum & mag hydroxide-simeth (MAALOX/MYLANTA) 200-200-20 MG/5ML suspension 30 mL  30 mL Oral Q4H PRN Money, Lowry Ram, FNP      . amiodarone (PACERONE) tablet 200 mg  200 mg Oral Daily Money, Lowry Ram, FNP   200 mg at 09/07/17 0840  . atorvastatin (LIPITOR) tablet 20 mg  20 mg Oral q1800 Money, Lowry Ram, FNP   20 mg at 09/06/17 1814  . carvedilol (COREG) tablet 12.5 mg  12.5 mg Oral Q breakfast Money, Darnelle Maffucci B, FNP   12.5 mg at 09/07/17 0835  . carvedilol (COREG) tablet 25 mg  25 mg Oral Q supper Money, Lowry Ram, FNP      . clopidogrel (PLAVIX) tablet 75 mg  75 mg Oral Daily Money, Lowry Ram, FNP   75 mg at 09/07/17 5027  . fenofibrate tablet 160 mg  160 mg Oral Daily Money, Lowry Ram, FNP   160 mg at 09/07/17 0836  . FLUoxetine (PROZAC) capsule 20 mg  20 mg Oral QHS Sharma Covert, MD   20 mg at 09/06/17 2129  . fluticasone (FLOVENT HFA) 44 MCG/ACT inhaler 2 puff  2 puff Inhalation BID Laverle Hobby, PA-C   2 puff at 09/07/17 7412  . furosemide (LASIX) tablet 40 mg  40 mg Oral Daily Money, Lowry Ram, FNP   40 mg at 09/07/17 0837  . guaiFENesin (MUCINEX) 12 hr tablet 1,200 mg  1,200 mg Oral BID Money, Lowry Ram, FNP    1,200 mg at 09/07/17 8786  . hydrOXYzine (ATARAX/VISTARIL) tablet 25 mg  25 mg Oral TID PRN Money, Lowry Ram, FNP   25 mg at 09/06/17 2130  . ibuprofen (ADVIL,MOTRIN) tablet 800 mg  800 mg Oral Once Derrill Center, NP      . ipratropium-albuterol (DUONEB) 0.5-2.5 (3) MG/3ML nebulizer solution 3 mL  3 mL Nebulization TID Money, Darnelle Maffucci B, FNP      . levothyroxine (SYNTHROID, LEVOTHROID) tablet 200 mcg  200 mcg Oral QAC breakfast Money, Lowry Ram, FNP   200 mcg at 09/07/17 7672  . lisinopril (PRINIVIL,ZESTRIL) tablet 40 mg  40 mg Oral Daily Laverle Hobby, PA-C   40 mg at 09/07/17 0947  . magnesium hydroxide (MILK OF MAGNESIA) suspension 30 mL  30 mL Oral Daily PRN Money, Lowry Ram, FNP      . predniSONE (DELTASONE) tablet 30 mg  30 mg Oral Q breakfast Sharma Covert, MD   30 mg at 09/07/17 0836   Followed by  . [START ON 09/09/2017] predniSONE (DELTASONE) tablet 20 mg  20 mg Oral Q breakfast Sharma Covert, MD       Followed by  . [START ON 09/11/2017] predniSONE (DELTASONE) tablet 10 mg  10  mg Oral Q breakfast Sharma Covert, MD      . traZODone (DESYREL) tablet 50 mg  50 mg Oral QHS PRN Money, Lowry Ram, FNP   50 mg at 09/06/17 2130  . venlafaxine XR (EFFEXOR-XR) 24 hr capsule 75 mg  75 mg Oral Q breakfast Sharma Covert, MD   75 mg at 09/07/17 0835  . Warfarin - Pharmacist Dosing Inpatient   Does not apply [id number] Sharma Covert, MD       PTA Medications: Medications Prior to Admission  Medication Sig Dispense Refill Last Dose  . acetaminophen (TYLENOL) 325 MG tablet Take 650 mg by mouth every 6 (six) hours as needed for mild pain or headache.     . Melatonin 5 MG TABS Take 5-10 mg by mouth at bedtime.     Marland Kitchen albuterol (PROAIR HFA) 108 (90 Base) MCG/ACT inhaler Inhale 1 puff into the lungs every 4 (four) hours as needed.     . ALPRAZolam (XANAX) 1 MG tablet Take 0.5 tablets (0.5 mg total) by mouth at bedtime as needed for anxiety. 30 tablet 0   . amiodarone (PACERONE) 200 MG  tablet Take 200 mg by mouth every other day.    08/30/2017 at 0700  . atorvastatin (LIPITOR) 20 MG tablet Take 1 tablet (20 mg total) by mouth daily at 6 PM.     . budesonide-formoterol (SYMBICORT) 160-4.5 MCG/ACT inhaler Inhale 2 puffs into the lungs 2 (two) times daily.   Past Week at Unknown time  . carvedilol (COREG) 25 MG tablet Take 12.5-25 mg by mouth 2 (two) times daily with a meal. Take 12.5mg  in the morning and 25mg  in the evening   08/30/2017 at 2200  . clopidogrel (PLAVIX) 75 MG tablet Take 75 mg by mouth daily.    08/30/2017 at 2200  . enoxaparin (LOVENOX) 100 MG/ML injection Inject 0.9 mLs (90 mg total) into the skin every 12 (twelve) hours. 0 Syringe    . fenofibrate 160 MG tablet Take 160 mg by mouth daily.   08/30/2017 at 2200  . FLUoxetine (PROZAC) 20 MG capsule Take 60 mg by mouth daily.    08/30/2017 at 2200  . furosemide (LASIX) 40 MG tablet Take 40 mg by mouth daily as needed.   08/29/2017 at Unknown time  . ipratropium-albuterol (DUONEB) 0.5-2.5 (3) MG/3ML SOLN Take 3 mLs by nebulization 3 (three) times daily. 360 mL    . levothyroxine (SYNTHROID, LEVOTHROID) 200 MCG tablet Take 200 mcg by mouth daily.    08/30/2017 at 0700  . lisinopril (PRINIVIL,ZESTRIL) 40 MG tablet Take 40 mg by mouth daily.   08/30/2017 at 2200  . oxyCODONE-acetaminophen (PERCOCET) 7.5-325 MG per tablet Take 1 tablet by mouth every 6 (six) hours as needed.   Past Week at Unknown time  . potassium chloride SA (K-DUR,KLOR-CON) 20 MEQ tablet Take 20 mEq by mouth daily as needed.    08/30/2017 at 2200  . predniSONE (DELTASONE) 10 MG tablet Take 40mg  po daily for 2 days then 30mg  daily for 2 days then 20mg  daily for 2 days then 10mg  daily for 2 days then stop 30 tablet 0   . warfarin (COUMADIN) 2 MG tablet Take 2 mg by mouth as directed. 1 tablet daily except 1/2 tablet on Sunday & Wednesday    08/30/2017 at 2200    Patient Stressors: Marital or family conflict Traumatic event  Patient Strengths: Active sense of  humor Average or above average intelligence Religious Affiliation Supportive family/friends  Treatment Modalities:  Medication Management, Group therapy, Case management,  1 to 1 session with clinician, Psychoeducation, Recreational therapy.   Physician Treatment Plan for Primary Diagnosis: <principal problem not specified> Long Term Goal(s): Improvement in symptoms so as ready for discharge Improvement in symptoms so as ready for discharge   Short Term Goals: Ability to identify changes in lifestyle to reduce recurrence of condition will improve Ability to verbalize feelings will improve Ability to disclose and discuss suicidal ideas Ability to demonstrate self-control will improve Ability to identify and develop effective coping behaviors will improve Ability to maintain clinical measurements within normal limits will improve Ability to identify changes in lifestyle to reduce recurrence of condition will improve Ability to verbalize feelings will improve Ability to disclose and discuss suicidal ideas Ability to demonstrate self-control will improve Ability to identify and develop effective coping behaviors will improve Ability to maintain clinical measurements within normal limits will improve  Medication Management: Evaluate patient's response, side effects, and tolerance of medication regimen.  Therapeutic Interventions: 1 to 1 sessions, Unit Group sessions and Medication administration.  Evaluation of Outcomes: Progressing  Physician Treatment Plan for Secondary Diagnosis: Active Problems:   MDD (major depressive disorder), recurrent severe, without psychosis ([place])  Long Term Goal(s): Improvement in symptoms so as ready for discharge Improvement in symptoms so as ready for discharge   Short Term Goals: Ability to identify changes in lifestyle to reduce recurrence of condition will improve Ability to verbalize feelings will improve Ability to disclose and discuss suicidal  ideas Ability to demonstrate self-control will improve Ability to identify and develop effective coping behaviors will improve Ability to maintain clinical measurements within normal limits will improve Ability to identify changes in lifestyle to reduce recurrence of condition will improve Ability to verbalize feelings will improve Ability to disclose and discuss suicidal ideas Ability to demonstrate self-control will improve Ability to identify and develop effective coping behaviors will improve Ability to maintain clinical measurements within normal limits will improve     Medication Management: Evaluate patient's response, side effects, and tolerance of medication regimen.  Therapeutic Interventions: 1 to 1 sessions, Unit Group sessions and Medication administration.  Evaluation of Outcomes: Progressing   RN Treatment Plan for Primary Diagnosis: <principal problem not specified> Long Term Goal(s): Knowledge of disease and therapeutic regimen to maintain health will improve  Short Term Goals: Ability to disclose and discuss suicidal ideas, Ability to identify and develop effective coping behaviors will improve and Compliance with prescribed medications will improve  Medication Management: RN will administer medications as ordered by provider, will assess and evaluate patient's response and provide education to patient for prescribed medication. RN will report any adverse and/or side effects to prescribing provider.  Therapeutic Interventions: 1 on 1 counseling sessions, Psychoeducation, Medication administration, Evaluate responses to treatment, Monitor vital signs and CBGs as ordered, Perform/monitor CIWA, COWS, AIMS and Fall Risk screenings as ordered, Perform wound care treatments as ordered.  Evaluation of Outcomes: Progressing   LCSW Treatment Plan for Primary Diagnosis: <principal problem not specified> Long Term Goal(s): Safe transition to appropriate next level of care at  discharge, Engage patient in therapeutic group addressing interpersonal concerns.  Short Term Goals: Engage patient in aftercare planning with referrals and resources  Therapeutic Interventions: Assess for all discharge needs, 1 to 1 time with Social worker, Explore available resources and support systems, Assess for adequacy in community support network, Educate family and significant other(s) on suicide prevention, Complete Psychosocial Assessment, Interpersonal group therapy.  Evaluation of Outcomes: Progressing  Progress in Treatment: Attending groups: Yes. Participating in groups: Yes. Taking medication as prescribed: Yes. Toleration medication: Yes. Family/Significant other contact made: No, will contact:  if patient consents Patient understands diagnosis: Yes. Discussing patient identified problems/goals with staff: Yes. Medical problems stabilized or resolved: Yes. Denies suicidal/homicidal ideation: Yes. Issues/concerns per patient self-inventory: No. Other:   New problem(s) identified: None  New Short Term/Long Term Goal(s):medication stabilization, elimination of SI thoughts, development of comprehensive mental wellness plan.   Patient Goals:  Get my medications right and to stop feeling depressed.   Discharge Plan or Barriers: CSW will continue to assess for appropriate referrals and discharge planning.   Reason for Continuation of Hospitalization: Anxiety Depression Medication stabilization Suicidal ideation  Estimated Length of Stay:3-5 days  Attendees: Patient: 09/07/2017 8:44 AM  Physician: Dr. Myles Lipps, MD 09/07/2017 8:44 AM  Nursing: Darrol Angel, RN 09/07/2017 8:44 AM  RN Care Manager: Rhunette Croft 09/07/2017 8:44 AM  Social Worker: Radonna Ricker, [place] 09/07/2017 8:44 AM  Recreational Therapist: X 09/07/2017 8:44 AM  Other: X 09/07/2017 8:44 AM  Other: X 09/07/2017 8:44 AM  Other:X 09/07/2017 8:44 AM    Scribe for Treatment Team: Marylee Floras, [place] 09/07/2017 8:44  AM

## 2017-09-07 NOTE — Progress Notes (Signed)
[facility] MD Progress Note  09/07/2017 10:47 AM Ana Martin  MRN:  [id number] Subjective: Patient is seen and examined.  Patient is a 63 year old female with a past psychiatric history significant for major depression; severe.  She is seen in follow-up.  She continues to slowly improve.  Her mood is better today.  She is able to smile and engage.  Her sleep is still not good, but much of that may be because of the steroids for her COPD.  We discussed that.  The Effexor seems to be of some benefit, and I am planning on stopping her Prozac today.  She stated she like to come off the steroids a little bit more quickly than they are currently written.  We discussed that.  She stated she visited with her brother last night, and she did not realize how much that if she was successful in her suicide attempt it would have affected her family.  She remains in sinus rhythm.  On physical examination her lungs have decreased breath sounds throughout, but no wheezing or rales.  She states she still not sleeping well, and the trazodone does not seem to work.  She does not like the way it makes her feel.  She want to discuss possible changes.  We also discussed the fact that her Xanax is being changed to 0.5 mg p.o. daily as needed.  She also disclosed that she had a history of seizures in the past when she had stopped the Xanax abruptly.  I placed her on seizure precautions today.  Pharmacy continues to handle her Coumadin dosage.  Her INR today is 2.96 and her PT is 30.6. Principal Problem: <principal problem not specified> Diagnosis:   Patient Active Problem List   Diagnosis Date Noted  . MDD (major depressive disorder), recurrent severe, without psychosis ([place]) [F33.2] 09/04/2017  . Elevated INR [R79.1] 08/31/2017  . Syncope [R55] 08/31/2017  . Anticoagulant overdosage [T45.511A] 08/31/2017  . Wolff-Parkinson-White syndrome [I45.6] 08/31/2017  . COPD (chronic obstructive pulmonary disease) ([place]) [J44.9] 08/31/2017   . Pleuritic chest pain [R07.81] 08/31/2017  . Former smoker [R51.884] 08/31/2017  . Anxiety [F41.9]   . Depression [F32.9]   . Hypertension [I10]   . Hyperlipidemia [E78.5]   . Thyroid disease [E07.9]   . Other abnormal glucose [R73.09]   . Automatic implantable cardiac defibrillator in situ [Z95.810]   . Atrial fibrillation ([place]) [I48.91]   . Other left bundle branch block [I44.7]   . 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 Hypersomnia, unspecified [G47.10]   . Lump or mass in breast [N63.0]   . Unspecified vitamin D deficiency [E55.9]   . Thyroid cancer ([place]) [C73]    Total Time spent with patient: 20 minutes  Past Psychiatric History: See admission H&P  Past Medical History:  Past Medical History:  Diagnosis Date  . Anxiety   . Atrial fibrillation ([place])   . Automatic implantable cardiac defibrillator in situ    [place] DR-T 09-20-2007 Dr.Akbary  . Depression   . Hyperlipidemia   . 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 Hypertension   . Lump or mass in breast    at the 7 o'clock position of right breast(non-tender)  . Other abnormal glucose   . Other left bundle branch block   . Other primary cardiomyopathies   . Thyroid cancer ([place])   . Thyroid disease   . Unspecified vitamin D deficiency   . Wolff-Parkinson-White syndrome     Past  Surgical History:  Procedure Laterality Date  . ABDOMINAL HYSTERECTOMY    . CARDIAC VALVE REPLACEMENT     st jude silzone mitray mechanical [id number] ss# [id number]  . COLONOSCOPY     April 12, 2008  . PACEMAKER PLACEMENT     Family History:  Family History  Problem Relation Age of Onset  . Lung cancer Father        expired 85  . Sarcoidosis Mother        espired 2013   Family Psychiatric  History: See admission H&P Social History:  Social History   Substance and Sexual Activity  Alcohol Use No     Social History   Substance and Sexual Activity  Drug Use No    Social History    Socioeconomic History  . Marital status: Legally Separated    Spouse name: Not on file  . Number of children: Not on file  . Years of education: Not on file  . Highest education level: Not on file  Occupational History  . Not on file  Social Needs  . Financial resource strain: Not on file  . Food insecurity:    Worry: Not on file    Inability: Not on file  . Transportation needs:    Medical: Not on file    Non-medical: Not on file  Tobacco Use  . Smoking status: Former Smoker    Packs/day: 1.00    Years: 25.00    Pack years: 25.00    Types: Cigarettes    Last attempt to quit: 10/17/2009    Years since quitting: 7.8  . Smokeless tobacco: Never Used  Substance and Sexual Activity  . Alcohol use: No  . Drug use: No  . Sexual activity: Not Currently  Lifestyle  . Physical activity:    Days per week: Not on file    Minutes per session: Not on file  . Stress: Not on file  Relationships  . Social connections:    Talks on phone: Not on file    Gets together: Not on file    Attends religious service: Not on file    Active member of club or organization: Not on file    Attends meetings of clubs or organizations: Not on file    Relationship status: Not on file  Other Topics Concern  . Not on file  Social History Narrative  . Not on file   Additional Social History:                         Sleep: Fair  Appetite:  Fair  Current Medications: Current Facility-Administered Medications  Medication Dose Route Frequency Provider Last Rate Last Dose  . acetaminophen (TYLENOL) tablet 1,000 mg  1,000 mg Oral Q6H PRN Derrill Center, NP   1,000 mg at 09/06/17 1814  . albuterol (PROVENTIL HFA;VENTOLIN HFA) 108 (90 Base) MCG/ACT inhaler 1-2 puff  1-2 puff Inhalation Q6H PRN Laverle Hobby, PA-C      . ALPRAZolam Duanne Moron) tablet 0.5 mg  0.5 mg Oral Daily PRN Sharma Covert, MD      . alum & mag hydroxide-simeth (MAALOX/MYLANTA) 200-200-20 MG/5ML suspension 30 mL   30 mL Oral Q4H PRN Money, Lowry Ram, FNP      . amiodarone (PACERONE) tablet 200 mg  200 mg Oral Daily Money, Lowry Ram, FNP   200 mg at 09/07/17 0840  . atorvastatin (LIPITOR) tablet 20 mg  20 mg Oral q1800 Money, Lowry Ram,  FNP   20 mg at 09/06/17 1814  . carvedilol (COREG) tablet 12.5 mg  12.5 mg Oral Q breakfast Money, Darnelle Maffucci B, FNP   12.5 mg at 09/07/17 0835  . carvedilol (COREG) tablet 25 mg  25 mg Oral Q supper Money, Lowry Ram, FNP      . clopidogrel (PLAVIX) tablet 75 mg  75 mg Oral Daily Money, Lowry Ram, FNP   75 mg at 09/07/17 6283  . doxepin (SINEQUAN) capsule 25 mg  25 mg Oral QHS Sharma Covert, MD      . fenofibrate tablet 160 mg  160 mg Oral Daily Money, Lowry Ram, FNP   160 mg at 09/07/17 0836  . fluticasone (FLOVENT HFA) 44 MCG/ACT inhaler 2 puff  2 puff Inhalation BID Laverle Hobby, PA-C   2 puff at 09/07/17 1517  . furosemide (LASIX) tablet 40 mg  40 mg Oral Daily Money, Lowry Ram, FNP   40 mg at 09/07/17 0837  . guaiFENesin (MUCINEX) 12 hr tablet 1,200 mg  1,200 mg Oral BID Money, Lowry Ram, FNP   1,200 mg at 09/07/17 6160  . hydrOXYzine (ATARAX/VISTARIL) tablet 25 mg  25 mg Oral TID PRN Money, Lowry Ram, FNP   25 mg at 09/06/17 2130  . ibuprofen (ADVIL,MOTRIN) tablet 800 mg  800 mg Oral Once Derrill Center, NP      . ipratropium-albuterol (DUONEB) 0.5-2.5 (3) MG/3ML nebulizer solution 3 mL  3 mL Nebulization TID Money, Darnelle Maffucci B, FNP      . levothyroxine (SYNTHROID, LEVOTHROID) tablet 200 mcg  200 mcg Oral QAC breakfast Money, Lowry Ram, FNP   200 mcg at 09/07/17 7371  . lisinopril (PRINIVIL,ZESTRIL) tablet 40 mg  40 mg Oral Daily Laverle Hobby, PA-C   40 mg at 09/07/17 0626  . magnesium hydroxide (MILK OF MAGNESIA) suspension 30 mL  30 mL Oral Daily PRN Money, Lowry Ram, FNP      . venlafaxine XR (EFFEXOR-XR) 24 hr capsule 75 mg  75 mg Oral Q breakfast Sharma Covert, MD   75 mg at 09/07/17 0835  . Warfarin - Pharmacist Dosing Inpatient   Does not apply q1800 Sharma Covert, MD        Lab Results:  Results for orders placed or performed during the hospital encounter of 09/04/17 (from the past 48 hour(s))  Protime-INR     Status: Abnormal   Collection Time: 09/06/17  6:19 AM  Result Value Ref Range   Prothrombin Time 31.6 (H) 11.4 - 15.2 seconds   INR 3.09     Comment: Performed at [facility], [place] [address] [place], [place] [address]  Protime-INR     Status: Abnormal   Collection Time: 09/07/17  6:40 AM  Result Value Ref Range   Prothrombin Time 30.6 (H) 11.4 - 15.2 seconds   INR 2.96     Comment: Performed at [facility], [place] [address] [place], [place] [address]    Blood Alcohol level:  Lab Results  Component Value Date   ETH <10 50/10/3816    Metabolic Disorder Labs: No results found for: HGBA1C, MPG No results found for: PROLACTIN No results found for: CHOL, TRIG, HDL, CHOLHDL, VLDL, LDLCALC  Physical Findings: AIMS: Facial and Oral Movements Muscles of Facial Expression: None, normal Lips and Perioral Area: None, normal Jaw: None, normal Tongue: None, normal,Extremity Movements Upper (arms, wrists, hands, fingers): None, normal Lower (legs, knees, ankles, toes): None, normal, Trunk Movements Neck, shoulders,  hips: None, normal, Overall Severity Severity of abnormal movements (highest score from questions above): None, normal Incapacitation due to abnormal movements: None, normal Patient's awareness of abnormal movements (rate only patient's report): No Awareness, Dental Status Current problems with teeth and/or dentures?: No Does patient usually wear dentures?: No  CIWA:    COWS:     Musculoskeletal: Strength & Muscle Tone: within normal limits Gait & Station: normal Patient leans: N/A  Psychiatric Specialty Exam: Physical Exam  Nursing note and vitals reviewed. Constitutional: She is oriented to person, place, and time. She appears well-developed and well-nourished.  HENT:   Head: Normocephalic and atraumatic.  Cardiovascular: Normal rate and regular rhythm.  Respiratory: Effort normal and breath sounds normal. She exhibits tenderness.  Neurological: She is alert and oriented to person, place, and time.    ROS  Blood pressure 131/61, pulse 70, temperature 98.6 F (37 C), temperature source Oral, resp. rate 18, height 5\' 7"  (1.702 m), weight 92.1 kg (203 lb), SpO2 97 %.Body mass index is 31.79 kg/m.  General Appearance: Casual  Eye Contact:  Fair  Speech:  Slow  Volume:  Normal  Mood:  Depressed  Affect:  Congruent  Thought Process:  Coherent  Orientation:  Full (Time, Place, and Person)  Thought Content:  Logical  Suicidal Thoughts:  No  Homicidal Thoughts:  No  Memory:  Immediate;   Fair Recent;   Fair Remote;   Fair  Judgement:  Intact  Insight:  Fair  Psychomotor Activity:  Normal  Concentration:  Concentration: Fair and Attention Span: Fair  Recall:  [employer] of Knowledge:  Fair  Language:  Fair  Akathisia:  Negative  Handed:  Right  AIMS (if indicated):     Assets:  Communication Skills Desire for Improvement Housing Resilience Social Support Talents/Skills  ADL's:  Intact  Cognition:  WNL  Sleep:  Number of Hours: 6.75     Treatment Plan Summary: Daily contact with patient to assess and evaluate symptoms and progress in treatment, Medication management and Plan : Patient is seen and examined.  Patient is a 62 year old female with the above-stated past psychiatric history seen in follow-up.  Her mood continues to improve, but her sleep is a problem.  Because of her weight issues I am going to avoid Remeron.  I am going to stop the trazodone today, and we will try doxepin 50 mg p.o. nightly.  I will write for an EKG in the a.m. to make sure that her QTC is not changing.  She will continue on the Effexor XR 75 mg p.o. daily, but I am going to stop the Prozac today.  As well, I am going to change the Xanax from 0.5 mg p.o. daily to as  needed only.  She did disclosed in the past when they had weaned her off Xanax to abruptly she had a seizure, so I am going to place her on seizure precautions today.  She wants to come off the steroids little bit more quickly, and I am going to try to accommodate that.  She got 30 mg today, and I will write for 20 mg tomorrow, 10 mg a day after, and then stop.  Her vital signs are stable, she remains afebrile.  No other changes in her current medications.  Sharma Covert, MD 09/07/2017, 10:47 AM

## 2017-09-07 NOTE — Progress Notes (Signed)
Seizure precautions.  EKG to be done on 09/08/2017 Tuesday a.m.

## 2017-09-07 NOTE — Progress Notes (Signed)
Recreation Therapy Notes  Date: 8.5.19 Time: 0930 Location: [address] Dayroom  Group Topic: Stress Management  Goal Area(s) Addresses:  Patient will verbalize importance of using healthy stress management.  Patient will identify positive emotions associated with healthy stress management.   Intervention: Stress Management  Activity : Guided Imagery.  LRT introduced the stress management technique of guided imagery.  LRT read a script to lead patients on a journey through a meadow.  Patients were to follow along as script was read.  Education: Stress Management, Discharge Planning.   Education Outcome: Acknowledges edcuation/In group clarification offered/Needs additional education  Clinical Observations/Feedback: Pt did not attend group.    Victorino Sparrow, LRT/CTRS         Victorino Sparrow A 09/07/2017 11:27 AM

## 2017-09-07 NOTE — [facility] Suicide Risk Assessment (Signed)
[place] INPATIENT:  Family/Significant Other Suicide Prevention Education  Suicide Prevention Education:  Education Completed; surena, welge [phone]) has been identified by the patient as the family member/significant other with whom the patient will be residing, and identified as the person(s) who will aid the patient in the event of a mental health crisis (suicidal ideations/suicide attempt).  With written consent from the patient, the family member/significant other has been provided the following suicide prevention education, prior to the and/or following the discharge of the patient.  The suicide prevention education provided includes the following:  Suicide risk factors  Suicide prevention and interventions  National Suicide Hotline telephone number  [facility] assessment telephone number  [place] Emergency Assistance [place] and/or Residential Mobile Crisis Unit telephone number  Request made of family/significant other to:  Remove weapons (e.g., guns, rifles, knives), all items previously/currently identified as safety concern.    Remove drugs/medications (over-the-counter, prescriptions, illicit drugs), all items previously/currently identified as a safety concern.  The family member/significant other verbalizes understanding of the suicide prevention education information provided.  The family member/significant other agrees to remove the items of safety concern listed above.  Marylee Floras 09/07/2017, 1:30 PM

## 2017-09-07 NOTE — Progress Notes (Signed)
D:  Patient's self inventory sheet, patient sleeps good, sleep medicatton helpful.  Good appetite, low energy level, good concentration.  Rated depression 5, denied hopeless, anxiety 2.  Denied withdrawals.  Denied SI.  Physical problems, soreness.  Body pain #5, medication not helpful.  Goal is positive thoughts.  Plans to do everything.  Does have discharge plans. A:  Medications administered per MD orders.  Emotional support and encouragement given patient. R:  Denied SI and HI, contracts for safety.  Denied A/V hallucinations.  Safety maintained with 15 minute checks.

## 2017-09-08 ENCOUNTER — Encounter: Payer: Self-pay | Admitting: Cardiology

## 2017-09-08 DIAGNOSIS — Z87891 Personal history of nicotine dependence: Secondary | ICD-10-CM

## 2017-09-08 LAB — PROTIME-INR
INR: 2.77
PROTHROMBIN TIME: 29 s — AB (ref 11.4–15.2)

## 2017-09-08 MED ORDER — WARFARIN SODIUM 2.5 MG PO TABS
2.5000 mg | ORAL_TABLET | Freq: Once | ORAL | Status: AC
  Administered 2017-09-08: 2.5 mg via ORAL
  Filled 2017-09-08 (×2): qty 1

## 2017-09-08 MED ORDER — ALPRAZOLAM 0.5 MG PO TABS
0.5000 mg | ORAL_TABLET | Freq: Two times a day (BID) | ORAL | Status: DC | PRN
Start: 2017-09-08 — End: 2017-09-10
  Administered 2017-09-08 – 2017-09-09 (×2): 0.5 mg via ORAL
  Filled 2017-09-08 (×2): qty 1

## 2017-09-08 NOTE — Progress Notes (Signed)
D: Pt was in dayroom upon initial approach.  Pt presents with depressed affect and mood. She describes her day as "tired, long, boring" and reports goal is "going home."  Pt reports she may discharge tomorrow and she feels safe to do so.  Pt denies SI/HI, denies hallucinations, denies pain.  Pt has been in her room for the majority of the night.  She reports she wants to catch up on sleep because she did not sleep well last night.   A: Introduced self to pt.  Actively listened to pt and offered support and encouragement. Q15 minute safety checks maintained.  R: Pt is safe on the unit.  Pt verbally contracts for safety and reports she will inform staff of needs and concerns.  Will continue to monitor and assess.

## 2017-09-08 NOTE — Progress Notes (Signed)
ANTICOAGULATION CONSULT NOTE - Follow Up Consult  Pharmacy Consult for Warfarin Indication: atrial fibrillation--mechnical mitral valve  Allergies  Allergen Reactions  . Codeine Nausea Only    Patient Measurements: Height: 5\' 7"  (170.2 cm) Weight: 203 lb (92.1 kg) IBW/kg (Calculated) : 61.6 Heparin Dosing Weight:   Vital Signs: BP: 138/57 (08/06 0817) Pulse Rate: 70 (08/06 0748)  Labs: Recent Labs    09/06/17 0619 09/07/17 0640 09/08/17 0643  LABPROT 31.6* 30.6* 29.0*  INR 3.09 2.96 2.77    Estimated Creatinine Clearance: 65.8 mL/min (A) (by C-G formula based on SCr of 1.02 mg/dL (H)).   Medications:  Scheduled:  . amiodarone  200 mg Oral Daily  . atorvastatin  20 mg Oral q1800  . carvedilol  12.5 mg Oral Q breakfast  . carvedilol  25 mg Oral Q supper  . clopidogrel  75 mg Oral Daily  . doxepin  25 mg Oral QHS  . fenofibrate  160 mg Oral Daily  . fluticasone  2 puff Inhalation BID  . furosemide  40 mg Oral Daily  . guaiFENesin  1,200 mg Oral BID  . ibuprofen  800 mg Oral Once  . ipratropium-albuterol  3 mL Nebulization TID  . levothyroxine  200 mcg Oral QAC breakfast  . lisinopril  40 mg Oral Daily  . [START ON 09/09/2017] predniSONE  10 mg Oral Q breakfast  . venlafaxine XR  75 mg Oral Q breakfast  . warfarin  2.5 mg Oral ONCE-1800  . Warfarin - Pharmacist Dosing Inpatient   Does not apply q1800   PRN: acetaminophen, albuterol, ALPRAZolam, alum & mag hydroxide-simeth, hydrOXYzine, magnesium hydroxide  Assessment: PT= 29.0 INR= 2.77 Goal of Therapy:  INR 2.5-3.5 Monitor platelets by anticoagulation protocol: Yes   Plan:  Warfarin 2.5mg  X1 at 1800 PT-INR in AM  Kiyani Jernigan, Derald Macleod 09/08/2017,10:48 AM

## 2017-09-08 NOTE — Progress Notes (Addendum)
[facility] MD Progress Note  09/08/2017 1:49 PM Ana Martin  MRN:  [id number] Subjective: Patient reports she is feeling better, today denies any suicidal ideations, is future oriented . States recent suicidal ideation/overdose was impulsive and related to stress with her adult daughter, who had been staying over at her house .  States "it is always drama with my daughter" and reports intention to keeping the distance from daughter in order to decrease stress and focus on herself and her own well-being .  At this time denies any suicidal thoughts . She does not endorse medication side effects. She does complain of ongoing insomnia, states trazodone caused nightmares/vivid dreams and that doxepin was not helpful.  States that at home takes Xanax and melatonin, with good response and no side effects. Patient states she has been on Xanax for many years, it has been recently tapered down to current 0.5 mg once to twice a day. At this time does not endorse or present with symptoms of benzodiazepine withdrawal . Objective: I have discussed case with treatment team and have met with patient. 62 year old female with history of depression.  Admitted following suicide attempt by overdosing on Xanax (July 29 "(which she states was impulsive and triggered by an argument with her adult daughter patient reports history of chronic medical illnesses include history of heart disease (mechanical heart valve, defibrillator, chronic anticoagulation, thyroid cancer ). Currently patient is presenting alert, attentive, reports improving mood, minimizes severe depression or significant neurovegetative symptoms of depression at this time.  Denies psychotic symptoms and does not appear internally preoccupied.  Attributes to recent decompensation and suicide attempt to stressful relationship with adult daughter. Denies suicidal ideations at this time and presents future oriented. Currently denies medication side effects. No  disruptive or agitated behaviors on unit.  Principal Problem:  MDD, S/P suicide attempt by overdose  Diagnosis:   Patient Active Problem List   Diagnosis Date Noted  . MDD (major depressive disorder), recurrent severe, without psychosis ([place]) [F33.2] 09/04/2017  . Elevated INR [R79.1] 08/31/2017  . Syncope [R55] 08/31/2017  . Anticoagulant overdosage [T45.511A] 08/31/2017  . Wolff-Parkinson-White syndrome [I45.6] 08/31/2017  . COPD (chronic obstructive pulmonary disease) ([place]) [J44.9] 08/31/2017  . Pleuritic chest pain [R07.81] 08/31/2017  . Former smoker [A19.379] 08/31/2017  . Anxiety [F41.9]   . Depression [F32.9]   . Hypertension [I10]   . Hyperlipidemia [E78.5]   . Thyroid disease [E07.9]   . Other abnormal glucose [R73.09]   . Automatic implantable cardiac defibrillator in situ [Z95.810]   . Atrial fibrillation ([place]) [I48.91]   . Other left bundle branch block [I44.7]   . 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 Hypersomnia, unspecified [G47.10]   . Lump or mass in breast [N63.0]   . Unspecified vitamin D deficiency [E55.9]   . Thyroid cancer ([place]) [C73]    Total Time spent with patient: 20 minutes  Past Psychiatric History: See admission H&P  Past Medical History:  Past Medical History:  Diagnosis Date  . Anxiety   . Atrial fibrillation ([place])   . Automatic implantable cardiac defibrillator in situ    [place] DR-T 09-20-2007 Dr.Akbary  . Depression   . Hyperlipidemia   . 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 Hypertension   . Lump or mass in breast    at the 7 o'clock position of right breast(non-tender)  . Other abnormal glucose   . Other left bundle branch block   . Other primary cardiomyopathies   .  Thyroid cancer ([place])   . Thyroid disease   . Unspecified vitamin D deficiency   . Wolff-Parkinson-White syndrome     Past Surgical History:  Procedure Laterality Date  . ABDOMINAL HYSTERECTOMY    . CARDIAC VALVE REPLACEMENT      st jude silzone mitray mechanical [id number] ss# [id number] . COLONOSCOPY     April 12, 2008  . PACEMAKER PLACEMENT     Family History:  Family History  Problem Relation Age of Onset  . Lung cancer Father        expired 661 . Sarcoidosis Mother        espired 2013   Family Psychiatric  History: See admission H&P Social History:  Social History   Substance and Sexual Activity  Alcohol Use No     Social History   Substance and Sexual Activity  Drug Use No    Social History   Socioeconomic History  . Marital status: Legally Separated    Spouse name: Not on file  . Number of children: Not on file  . Years of education: Not on file  . Highest education level: Not on file  Occupational History  . Not on file  Social Needs  . Financial resource strain: Not on file  . Food insecurity:    Worry: Not on file    Inability: Not on file  . Transportation needs:    Medical: Not on file    Non-medical: Not on file  Tobacco Use  . Smoking status: Former Smoker    Packs/day: 1.00    Years: 25.00    Pack years: 25.00    Types: Cigarettes    Last attempt to quit: 10/17/2009    Years since quitting: 7.8  . Smokeless tobacco: Never Used  Substance and Sexual Activity  . Alcohol use: No  . Drug use: No  . Sexual activity: Not Currently  Lifestyle  . Physical activity:    Days per week: Not on file    Minutes per session: Not on file  . Stress: Not on file  Relationships  . Social connections:    Talks on phone: Not on file    Gets together: Not on file    Attends religious service: Not on file    Active member of club or organization: Not on file    Attends meetings of clubs or organizations: Not on file    Relationship status: Not on file  Other Topics Concern  . Not on file  Social History Narrative  . Not on file   Additional Social History:   Sleep: Improving  Appetite:  Improving  Current Medications: Current Facility-Administered Medications  Medication  Dose Route Frequency Provider Last Rate Last Dose  . acetaminophen (TYLENOL) tablet 1,000 mg  1,000 mg Oral Q6H PRN LDerrill Center NP   1,000 mg at 09/06/17 1814  . albuterol (PROVENTIL HFA;VENTOLIN HFA) 108 (90 Base) MCG/ACT inhaler 1-2 puff  1-2 puff Inhalation Q6H PRN SLaverle Hobby PA-C      . ALPRAZolam (Duanne Moron tablet 0.5 mg  0.5 mg Oral BID PRN Cobos, FMyer Peer MD      . alum & mag hydroxide-simeth (MAALOX/MYLANTA) 200-200-20 MG/5ML suspension 30 mL  30 mL Oral Q4H PRN Money, TLowry Ram FNP      . amiodarone (PACERONE) tablet 200 mg  200 mg Oral Daily Money, TLowry Ram FNorth [place]  Stopped at 09/08/17 0813  . atorvastatin (LIPITOR) tablet 20 mg  20 mg Oral q1800 Money,  Lowry Ram, FNP   20 mg at 09/07/17 1704  . carvedilol (COREG) tablet 12.5 mg  12.5 mg Oral Q breakfast Money, Lowry Ram, FNP   Stopped at 09/08/17 0815  . carvedilol (COREG) tablet 25 mg  25 mg Oral Q supper Money, Lowry Ram, FNP   25 mg at 09/07/17 1705  . clopidogrel (PLAVIX) tablet 75 mg  75 mg Oral Daily Money, Lowry Ram, FNP   75 mg at 09/08/17 0813  . fenofibrate tablet 160 mg  160 mg Oral Daily Money, Lowry Ram, FNP   160 mg at 09/08/17 0813  . fluticasone (FLOVENT HFA) 44 MCG/ACT inhaler 2 puff  2 puff Inhalation BID Laverle Hobby, PA-C   2 puff at 09/08/17 0818  . furosemide (LASIX) tablet 40 mg  40 mg Oral Daily Money, Lowry Ram, FNP   40 mg at 09/08/17 0818  . guaiFENesin (MUCINEX) 12 hr tablet 1,200 mg  1,200 mg Oral BID Money, Lowry Ram, FNP   1,200 mg at 09/08/17 0816  . hydrOXYzine (ATARAX/VISTARIL) tablet 25 mg  25 mg Oral TID PRN Money, Lowry Ram, FNP   25 mg at 09/07/17 2338  . ibuprofen (ADVIL,MOTRIN) tablet 800 mg  800 mg Oral Once Derrill Center, NP      . ipratropium-albuterol (DUONEB) 0.5-2.5 (3) MG/3ML nebulizer solution 3 mL  3 mL Nebulization TID Money, Darnelle Maffucci B, FNP      . levothyroxine (SYNTHROID, LEVOTHROID) tablet 200 mcg  200 mcg Oral QAC breakfast Money, Lowry Ram, FNP   200 mcg at 09/08/17 [phone]  .  lisinopril (PRINIVIL,ZESTRIL) tablet 40 mg  40 mg Oral Daily Laverle Hobby, PA-C   40 mg at 09/07/17 5208  . magnesium hydroxide (MILK OF MAGNESIA) suspension 30 mL  30 mL Oral Daily PRN Money, Lowry Ram, FNP      . [START ON 09/09/2017] predniSONE (DELTASONE) tablet 10 mg  10 mg Oral Q breakfast Sharma Covert, MD      . venlafaxine XR [facility]) 24 hr capsule 75 mg  75 mg Oral Q breakfast Sharma Covert, MD   75 mg at 09/08/17 0818  . warfarin (COUMADIN) tablet 2.5 mg  2.5 mg Oral ONCE-1800 Julien Girt, RPH      . Warfarin - Pharmacist Dosing Inpatient   Does not apply q1800 Sharma Covert, MD        Lab Results:  Results for orders placed or performed during the hospital encounter of 09/04/17 (from the past 48 hour(s))  Protime-INR     Status: Abnormal   Collection Time: 09/07/17  6:40 AM  Result Value Ref Range   Prothrombin Time 30.6 (H) 11.4 - 15.2 seconds   INR 2.96     Comment: Performed at [facility], [place] [address], [place], [place] [address]  Protime-INR     Status: Abnormal   Collection Time: 09/08/17  6:43 AM  Result Value Ref Range   Prothrombin Time 29.0 (H) 11.4 - 15.2 seconds   INR 2.77     Comment: Performed at [facility], [place] [address] [place], [place] [address]    Blood Alcohol level:  Lab Results  Component Value Date   ETH <10 49/75/3005    Metabolic Disorder Labs: No results found for: HGBA1C, MPG No results found for: PROLACTIN No results found for: CHOL, TRIG, HDL, CHOLHDL, VLDL, LDLCALC  Physical Findings: AIMS: Facial and Oral Movements Muscles of Facial Expression: None, normal Lips and Perioral  Area: None, normal Jaw: None, normal Tongue: None, normal,Extremity Movements Upper (arms, wrists, hands, fingers): None, normal Lower (legs, knees, ankles, toes): None, normal, Trunk Movements Neck, shoulders, hips: None, normal, Overall Severity Severity of abnormal movements (highest score  from questions above): None, normal Incapacitation due to abnormal movements: None, normal Patient's awareness of abnormal movements (rate only patient's report): No Awareness, Dental Status Current problems with teeth and/or dentures?: No Does patient usually wear dentures?: No  CIWA:  CIWA-Ar Total: 1 COWS:  COWS Total Score: 1  Musculoskeletal: Strength & Muscle Tone: within normal limits Gait & Station: normal Patient leans: N/A  Psychiatric Specialty Exam: Physical Exam  Nursing note and vitals reviewed. Constitutional: She is oriented to person, place, and time. She appears well-developed and well-nourished.  HENT:  Head: Normocephalic and atraumatic.  Cardiovascular: Normal rate and regular rhythm.  Respiratory: Effort normal and breath sounds normal. She exhibits tenderness.  Neurological: She is alert and oriented to person, place, and time.    ROS denies headache, no chest pain, no shortness of breath at room air   Blood pressure 132/66, pulse 70, temperature 98.6 F (37 C), temperature source Oral, resp. rate 20, height 5' 7"  (1.702 m), weight 92.1 kg (203 lb), SpO2 99 %.Body mass index is 31.79 kg/m.  General Appearance: Fairly Groomed  Eye Contact:  Good  Speech:  Normal Rate  Volume:  Normal  Mood:  Patient reports improving mood  Affect:  Appropriate and reactive  Thought Process:  Linear and Descriptions of Associations: Intact  Orientation: Fully alert and attentive  Thought Content:  Denies  hallucinations, no delusions expressed, not internally preoccupied  Suicidal Thoughts:  No-today denies suicidal ideations, contracts for safety on unit, also denies homicidal ideations.  Specifically also denies any homicidal or violent ideations towards daughter  Homicidal Thoughts:  No  Memory:  Recent and remote grossly intact  Judgement:  Other:  Improving  Insight:  Improving  Psychomotor Activity:  Normal  Concentration:  Concentration: Good and Attention Span:  Good  Recall:  Good  Fund of Knowledge:  Good  Language:  Good  Akathisia:  Negative  Handed:  Right  AIMS (if indicated):     Assets:  Communication Skills Desire for Improvement Housing Resilience Social Support Talents/Skills  ADL's:  Intact  Cognition:  WNL  Sleep:  Number of Hours: 5.5   Assessment-62 year old female, history of depression, presented following suicide attempt by overdosing on Xanax on July 29.  States this was impulsive, unplanned, triggered by stressful relationship with her daughter.  Today reports improvement, denies suicidal ideations, denies self-injurious thoughts, presents future oriented.  Of note, patient has a history of cardiac illness and is on chronic anticoagulation.  Describes easy bruising particularly on forearms but denies any other bleeding.  Complains of insomnia, but states she did not feel Doxepin worked well for her.  Currently on Effexor XR trial which she is tolerating well thus far.   Treatment Plan Summary: Treatment plan reviewed as below today August 6 Encourage group and milieu participation to work on Radiographer, therapeutic and symptom reduction Adjust Xanax to 0.5 mg twice daily as needed anxiety or insomnia Discontinue Doxepin Continue Effexor XR 75 mg daily for depression Continue anticoagulation management(chronic) -on Plavix and Coumadin.  Coumadin management as per pharmacy/protocol. Continue Synthroid 200 mcg for hypothyroidism. Completing Prednisone taper for COPD exacerbation. Treatment team working on disposition planning options.   Ana Campus, MD 09/08/2017, 1:49 PMPatient ID: Ana Martin, female  DOB: 01/01/1956, 62 y.o.   MRN: [id number]  Addendum 6,00 PM- have reviewed EKG with Cardiology consultant - QTc prolongation not considered acutely concerning at this time based on implanted cardiac defibrillator- (of note , no change compared to prior EKG 08/30/17, which had QTc of 539.) , but recommend checking K+, Mg++.   FCobos MD Dr. Parke Poisson

## 2017-09-08 NOTE — Progress Notes (Signed)
D: Pt was in dayroom upon initial approach.  Pt presents with depressed affect and mood.  She reports she is "doing fine."  Her goal is "just positive, letting go of all the negativity, I'm looking forward to making some changes I should've made before."  Pt denies SI/HI, denies hallucinations, denies pain.  Pt has been visible in milieu interacting with peers and staff appropriately.  Pt attended evening group.    A: Introduced self to pt.  Actively listened to pt and offered support and encouragement. Medications administered per order.  PRN medication administered for anxiety.  L index finger dressing was changed, pt washed wound with soap and water, and there is currently a band-aid in place per pt's request.  She states "that feels and looks so much better, tomorrow we'll do the other one."  Q15 minute safety checks maintained.  R: Pt is safe on the unit.  Pt is compliant with medications.  Pt verbally contracts for safety.  Will continue to monitor and assess.

## 2017-09-08 NOTE — Progress Notes (Addendum)
D: [place] has been pleasant, calm, and cooperative today. She denies SI, HI, and AVH but appears depressed. She reported poor sleep with doxepin. On her self inventory form, she also reported good appetite, low energy level, and good concentration. She rated her level of depression 4/10 and anxiety 7/10. She said she is working on staying positive.   A: ECG done and handed to Dr. Parke Poisson. Dressings to left arm changed. Meds given as ordered, except HTN meds, which were held d/t low diastolic, which was reported as well. Q15 safety checks maintained. Support/encouragement offered.  R: Pt remains free from harm and continues with treatment. Will continue to monitor for needs/safety.

## 2017-09-08 NOTE — Progress Notes (Signed)
Adult Psychoeducational Group Note  Date:  09/08/2017 Time:  9:01 PM  Group Topic/Focus:  Wrap-Up Group:   The focus of this group is to help patients review their daily goal of treatment and discuss progress on daily workbooks.  Participation Level:  Did Not Attend  Participation Quality:  Did not attend  Affect:  Did not attend  Cognitive:  Did not attend  Insight: None  Engagement in Group:  Did not attend  Modes of Intervention:  Did not attend  Additional Comments:  Patient did not attend wrap up group tonight.   Ana Martin 09/08/2017, 9:01 PM

## 2017-09-08 NOTE — B[facility] Group Notes (Signed)
LCSW Group Therapy Note 09/08/2017 3:03 PM  Type of Therapy/Topic: Group Therapy: Feelings about Diagnosis  Participation Level: Active   Description of Group:  This group will allow patients to explore their thoughts and feelings about diagnoses they have received. Patients will be guided to explore their level of understanding and acceptance of these diagnoses. Facilitator will encourage patients to process their thoughts and feelings about the reactions of others to their diagnosis and will guide patients in identifying ways to discuss their diagnosis with significant others in their lives. This group will be process-oriented, with patients participating in exploration of their own experiences, giving and receiving support, and processing challenge from other group members.  Therapeutic Goals: 1. Patient will demonstrate understanding of diagnosis as evidenced by identifying two or more symptoms of the disorder 2. Patient will be able to express two feelings regarding the diagnosis 3. Patient will demonstrate their ability to communicate their needs through discussion and/or role play  Summary of Patient Progress:  Ana Martin was engaged and participated throughout the group session. Ana Martin shared that she felt having a mental health diagnosis was "labeling" and that she does not like that. Ana Martin states that coming to the hospital for treatment "really wakes you up".     Therapeutic Modalities:  Cognitive Behavioral Therapy Brief Therapy Feelings Identification    [place] Clinical Social Worker

## 2017-09-09 LAB — BASIC METABOLIC PANEL
Anion gap: 11 (ref 5–15)
BUN: 36 mg/dL — ABNORMAL HIGH (ref 8–23)
CHLORIDE: 99 mmol/L (ref 98–111)
CO2: 31 mmol/L (ref 22–32)
Calcium: 9.5 mg/dL (ref 8.9–10.3)
Creatinine, Ser: 1.16 mg/dL — ABNORMAL HIGH (ref 0.44–1.00)
GFR, EST AFRICAN AMERICAN: 57 mL/min — AB (ref >60)
GFR, EST NON AFRICAN AMERICAN: 49 mL/min — AB (ref >60)
Glucose, Bld: 90 mg/dL (ref 70–99)
POTASSIUM: 4 mmol/L (ref 3.5–5.1)
SODIUM: 141 mmol/L (ref 135–145)

## 2017-09-09 LAB — MAGNESIUM: MAGNESIUM: 2.1 mg/dL (ref 1.7–2.4)

## 2017-09-09 LAB — PROTIME-INR
INR: 2.56
Prothrombin Time: 27.3 seconds — ABNORMAL HIGH (ref 11.4–15.2)

## 2017-09-09 MED ORDER — WARFARIN SODIUM 4 MG PO TABS
4.0000 mg | ORAL_TABLET | Freq: Once | ORAL | Status: AC
  Administered 2017-09-09: 4 mg via ORAL
  Filled 2017-09-09: qty 1

## 2017-09-09 NOTE — BHH Group Notes (Signed)
[facility] Mental Health Association Group Therapy 09/09/2017 1:15pm  Type of Therapy: Mental Health Association Presentation  Participation Level: Active  Participation Quality: Attentive  Affect: Appropriate  Cognitive: Oriented  Insight: Developing/Improving  Engagement in Therapy: Engaged  Modes of Intervention: Discussion, Education and Socialization  Summary of Progress/Problems: Ana Martin ([place]) Speaker came to talk about his personal journey with mental health. The pt processed ways by which to relate to the speaker. Ana Martin speaker provided handouts and educational information pertaining to groups and services offered by the [facility]. Pt was engaged in speaker's presentation and was receptive to resources provided.    [name], LCSW 09/09/2017 1:27 PM

## 2017-09-09 NOTE — Progress Notes (Signed)
ANTICOAGULATION CONSULT NOTE - Follow Up Consult  Pharmacy Consult for Warfarin Indication: atrial fibrillation--mechnical mitral valve  Allergies  Allergen Reactions  . Codeine Nausea Only   Patient Measurements: Height: 5\' 7"  (170.2 cm) Weight: 203 lb (92.1 kg) IBW/kg (Calculated) : 61.6  Vital Signs: Temp: 97.4 F (36.3 C) (08/07 0633) Temp Source: Oral (08/07 7782) BP: 159/90 (08/07 0634) Pulse Rate: 70 (08/07 0634)  Labs: Recent Labs    09/07/17 0640 09/08/17 0643 09/09/17 0640  LABPROT 30.6* 29.0* 27.3*  INR 2.96 2.77 2.56  CREATININE  --   --  1.16*   Estimated Creatinine Clearance: 57.8 mL/min (A) (by C-G formula based on SCr of 1.16 mg/dL (H)).  Medications:  Scheduled:  . amiodarone  200 mg Oral Daily  . atorvastatin  20 mg Oral q1800  . carvedilol  12.5 mg Oral Q breakfast  . carvedilol  25 mg Oral Q supper  . clopidogrel  75 mg Oral Daily  . fenofibrate  160 mg Oral Daily  . fluticasone  2 puff Inhalation BID  . furosemide  40 mg Oral Daily  . guaiFENesin  1,200 mg Oral BID  . ibuprofen  800 mg Oral Once  . ipratropium-albuterol  3 mL Nebulization TID  . levothyroxine  200 mcg Oral QAC breakfast  . lisinopril  40 mg Oral Daily  . venlafaxine XR  75 mg Oral Q breakfast  . warfarin  4 mg Oral ONCE-1800  . Warfarin - Pharmacist Dosing Inpatient   Does not apply q1800   PRN: acetaminophen, albuterol, ALPRAZolam, alum & mag hydroxide-simeth, hydrOXYzine, magnesium hydroxide  Assessment: INR= 2.56 Goal of Therapy:  INR 2.5-3.5 Monitor platelets by anticoagulation protocol: Yes   Plan:  Warfarin 4 mg X1 today at 1800 Daily Protime/INR  Minda Ditto PharmD Pager [phone] 09/09/2017, 10:39 AM

## 2017-09-09 NOTE — Progress Notes (Signed)
[facility] MD Progress Note  09/09/2017 4:42 PM Ana Martin  MRN:  [id number] Subjective: patient reports she is feeling better than on admission. As she improves she is becoming more future oriented and expresses hope to be discharged soon. Currently denies suicidal ideations. Denies medication side effects.  Objective: I have discussed case with treatment team and have met with patient. 62 year old female with history of depression, presented with suicide attempt by overdosing on Xanax which she states was impulsive and triggered by an argument with her adult daughter . History of  chronic medical illnesses including  history of heart disease (mechanical heart valve, defibrillator, chronic anticoagulation) and thyroid cancer. At this time reports she is feeling better, currently minimizes depression or significant neuro-vegetative symptoms of depression and presents less ruminative about stressors /strained relationship with daughter. Of note, reports that her sister, who is involved in her care, wants her to transfer outpatient cardiology appointments and treatment to a local cardiology clinic, due to convenience of transportation, being closer. Patient expresses some ambivalence, states she has been seeing same cardiologist in [facility] for years. As per CSW will need a referral form to establish care at local  Cardiology Clinic. She has told staff/CSW to proceed with referral , but not sure if she will follow up with it, or remain with her cardiologist in [facility] . Visible in day room, going to some groups, no disruptive or agitated behaviors. Denies medication side effects. Denies suicidal ideations. Labs reviewed- K+, Mg++ WNL.  Coumadin being managed by pharmacy - INR today 2.56.   Principal Problem:  MDD, S/P suicide attempt by overdose  Diagnosis:   Patient Active Problem List   Diagnosis Date Noted  . MDD (major depressive disorder), recurrent severe, without psychosis ([place])  [F33.2] 09/04/2017  . Elevated INR [R79.1] 08/31/2017  . Syncope [R55] 08/31/2017  . Anticoagulant overdosage [T45.511A] 08/31/2017  . Wolff-Parkinson-White syndrome [I45.6] 08/31/2017  . COPD (chronic obstructive pulmonary disease) ([place]) [J44.9] 08/31/2017  . Pleuritic chest pain [R07.81] 08/31/2017  . Former smoker [U27.253] 08/31/2017  . Anxiety [F41.9]   . Depression [F32.9]   . Hypertension [I10]   . Hyperlipidemia [E78.5]   . Thyroid disease [E07.9]   . Other abnormal glucose [R73.09]   . Automatic implantable cardiac defibrillator in situ [Z95.810]   . Atrial fibrillation ([place]) [I48.91]   . Other left bundle branch block [I44.7]   . 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 Hypersomnia, unspecified [G47.10]   . Lump or mass in breast [N63.0]   . Unspecified vitamin D deficiency [E55.9]   . Thyroid cancer ([place]) [C73]    Total Time spent with patient: 20 minutes  Past Psychiatric History: See admission H&P  Past Medical History:  Past Medical History:  Diagnosis Date  . Anxiety   . Atrial fibrillation ([place])   . Automatic implantable cardiac defibrillator in situ    [place]umax DR-T 09-20-2007 Dr.Akbary  . Depression   . Hyperlipidemia   . 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 Hypertension   . Lump or mass in breast    at the 7 o'clock position of right breast(non-tender)  . Other abnormal glucose   . Other left bundle branch block   . Other primary cardiomyopathies   . Thyroid cancer ([place])   . Thyroid disease   . Unspecified vitamin D deficiency   . Wolff-Parkinson-White syndrome     Past Surgical History:  Procedure Laterality Date  . ABDOMINAL HYSTERECTOMY    .  CARDIAC VALVE REPLACEMENT     [facility] silzone [employer] mechanical [id number] ss# [id number] . COLONOSCOPY     April 12, 2008  . PACEMAKER PLACEMENT     Family History:  Family History  Problem Relation Age of Onset  . Lung cancer Father        expired 617 . Sarcoidosis  Mother        espired 2013   Family Psychiatric  History: See admission H&P Social History:  Social History   Substance and Sexual Activity  Alcohol Use No     Social History   Substance and Sexual Activity  Drug Use No    Social History   Socioeconomic History  . Marital status: Legally Separated    Spouse name: Not on file  . Number of children: Not on file  . Years of education: Not on file  . Highest education level: Not on file  Occupational History  . Not on file  Social Needs  . Financial resource strain: Not on file  . Food insecurity:    Worry: Not on file    Inability: Not on file  . Transportation needs:    Medical: Not on file    Non-medical: Not on file  Tobacco Use  . Smoking status: Former Smoker    Packs/day: 1.00    Years: 25.00    Pack years: 25.00    Types: Cigarettes    Last attempt to quit: 10/17/2009    Years since quitting: 7.9  . Smokeless tobacco: Never Used  Substance and Sexual Activity  . Alcohol use: No  . Drug use: No  . Sexual activity: Not Currently  Lifestyle  . Physical activity:    Days per week: Not on file    Minutes per session: Not on file  . Stress: Not on file  Relationships  . Social connections:    Talks on phone: Not on file    Gets together: Not on file    Attends religious service: Not on file    Active member of club or organization: Not on file    Attends meetings of clubs or organizations: Not on file    Relationship status: Not on file  Other Topics Concern  . Not on file  Social History Narrative  . Not on file   Additional Social History:   Sleep: Good  Appetite:  Good  Current Medications: Current Facility-Administered Medications  Medication Dose Route Frequency Provider Last Rate Last Dose  . acetaminophen (TYLENOL) tablet 1,000 mg  1,000 mg Oral Q6H PRN LDerrill Center NP   1,000 mg at 09/09/17 0937  . albuterol (PROVENTIL HFA;VENTOLIN HFA) 108 (90 Base) MCG/ACT inhaler 1-2 puff  1-2 puff  Inhalation Q6H PRN SLaverle Hobby PA-C      . ALPRAZolam (Duanne Moron tablet 0.5 mg  0.5 mg Oral BID PRN Cobos, FMyer Peer MD   0.5 mg at 09/08/17 1714  . alum & mag hydroxide-simeth (MAALOX/MYLANTA) 200-200-20 MG/5ML suspension 30 mL  30 mL Oral Q4H PRN Money, TLowry Ram FNP      . amiodarone (PACERONE) tablet 200 mg  200 mg Oral Daily Money, TLowry Ram FNP   200 mg at 09/09/17 01694 . atorvastatin (LIPITOR) tablet 20 mg  20 mg Oral q1800 Money, TLowry Ram FNP   20 mg at 09/08/17 1712  . carvedilol (COREG) tablet 12.5 mg  12.5 mg Oral Q breakfast Money, TDarnelle MaffucciB, FNP   12.5 mg at 09/09/17 0754  .  carvedilol (COREG) tablet 25 mg  25 mg Oral Q supper Money, Lowry Ram, FNP   25 mg at 09/08/17 1711  . clopidogrel (PLAVIX) tablet 75 mg  75 mg Oral Daily Money, Lowry Ram, FNP   75 mg at 09/09/17 0753  . fenofibrate tablet 160 mg  160 mg Oral Daily Money, Lowry Ram, FNP   160 mg at 09/09/17 0753  . fluticasone (FLOVENT HFA) 44 MCG/ACT inhaler 2 puff  2 puff Inhalation BID Laverle Hobby, PA-C   2 puff at 09/09/17 2683  . furosemide (LASIX) tablet 40 mg  40 mg Oral Daily Money, Lowry Ram, FNP   40 mg at 09/09/17 0753  . guaiFENesin (MUCINEX) 12 hr tablet 1,200 mg  1,200 mg Oral BID Money, Darnelle Maffucci B, FNP   1,200 mg at 09/09/17 0753  . hydrOXYzine (ATARAX/VISTARIL) tablet 25 mg  25 mg Oral TID PRN Money, Lowry Ram, FNP   25 mg at 09/07/17 2338  . ibuprofen (ADVIL,MOTRIN) tablet 800 mg  800 mg Oral Once Derrill Center, NP      . ipratropium-albuterol (DUONEB) 0.5-2.5 (3) MG/3ML nebulizer solution 3 mL  3 mL Nebulization TID Money, Darnelle Maffucci B, FNP      . levothyroxine (SYNTHROID, LEVOTHROID) tablet 200 mcg  200 mcg Oral QAC breakfast Money, Lowry Ram, FNP   200 mcg at 09/09/17 4196  . lisinopril (PRINIVIL,ZESTRIL) tablet 40 mg  40 mg Oral Daily Patriciaann Clan E, PA-C   40 mg at 09/09/17 0753  . magnesium hydroxide (MILK OF MAGNESIA) suspension 30 mL  30 mL Oral Daily PRN Money, Lowry Ram, FNP      . venlafaxine XR  (EFFEXOR-XR) 24 hr capsule 75 mg  75 mg Oral Q breakfast Sharma Covert, MD   75 mg at 09/09/17 0753  . warfarin (COUMADIN) tablet 4 mg  4 mg Oral ONCE-1800 Green, Terri L, RPH      . Warfarin - Pharmacist Dosing Inpatient   Does not apply q1800 Sharma Covert, MD        Lab Results:  Results for orders placed or performed during the hospital encounter of 09/04/17 (from the past 48 hour(s))  Protime-INR     Status: Abnormal   Collection Time: 09/08/17  6:43 AM  Result Value Ref Range   Prothrombin Time 29.0 (H) 11.4 - 15.2 seconds   INR 2.77     Comment: Performed at [facility], [place] [address], [place], [place] [address]  Protime-INR     Status: Abnormal   Collection Time: 09/09/17  6:40 AM  Result Value Ref Range   Prothrombin Time 27.3 (H) 11.4 - 15.2 seconds   INR 2.56     Comment: Performed at [facility], [place] [address] [place], [place] [address]  Basic metabolic panel     Status: Abnormal   Collection Time: 09/09/17  6:40 AM  Result Value Ref Range   Sodium 141 135 - 145 mmol/L   Potassium 4.0 3.5 - 5.1 mmol/L   Chloride 99 98 - 111 mmol/L   CO2 31 22 - 32 mmol/L   Glucose, Bld 90 70 - 99 mg/dL   BUN 36 (H) 8 - 23 mg/dL   Creatinine, Ser 1.16 (H) 0.44 - 1.00 mg/dL   Calcium 9.5 8.9 - 10.3 mg/dL   GFR calc non Af Amer 49 (L) >60 mL/min   GFR calc Af Amer 57 (L) >60 mL/min    Comment: (NOTE) The eGFR has been  calculated using the CKD EPI equation. This calculation has not been validated in all clinical situations. eGFR's persistently <60 mL/min signify possible Chronic Kidney Disease.    Anion gap 11 5 - 15    Comment: Performed at [facility], [place] [address], [place], [place] [address]  Magnesium     Status: None   Collection Time: 09/09/17  6:40 AM  Result Value Ref Range   Magnesium 2.1 1.7 - 2.4 mg/dL    Comment: Performed at [facility], [place] [address], [place], [place]  [address]    Blood Alcohol level:  Lab Results  Component Value Date   ETH <10 55/97/4163    Metabolic Disorder Labs: No results found for: HGBA1C, MPG No results found for: PROLACTIN No results found for: CHOL, TRIG, HDL, CHOLHDL, VLDL, LDLCALC  Physical Findings: AIMS: Facial and Oral Movements Muscles of Facial Expression: None, normal Lips and Perioral Area: None, normal Jaw: None, normal Tongue: None, normal,Extremity Movements Upper (arms, wrists, hands, fingers): None, normal Lower (legs, knees, ankles, toes): None, normal, Trunk Movements Neck, shoulders, hips: None, normal, Overall Severity Severity of abnormal movements (highest score from questions above): None, normal Incapacitation due to abnormal movements: None, normal Patient's awareness of abnormal movements (rate only patient's report): No Awareness, Dental Status Current problems with teeth and/or dentures?: No Does patient usually wear dentures?: No  CIWA:  CIWA-Ar Total: 1 COWS:  COWS Total Score: 1  Musculoskeletal: Strength & Muscle Tone: within normal limits Gait & Station: normal Patient leans: N/A  Psychiatric Specialty Exam: Physical Exam  Nursing note and vitals reviewed. Constitutional: She is oriented to person, place, and time. She appears well-developed and well-nourished.  HENT:  Head: Normocephalic and atraumatic.  Cardiovascular: Normal rate and regular rhythm.  Respiratory: Effort normal and breath sounds normal.  Neurological: She is alert and oriented to person, place, and time.    ROS denies headache, no chest pain, no shortness of breath at room air , no bleeding other than easy bruising   Blood pressure 128/60, pulse 68, temperature (!) 97.4 F (36.3 C), temperature source Oral, resp. rate 16, height 5' 7" (1.702 m), weight 92.1 kg (203 lb), SpO2 97 %.Body mass index is 31.79 kg/m.  General Appearance: improving grooming   Eye Contact:  Good  Speech:  Normal Rate  Volume:   Normal  Mood:  reports improving mood  Affect:  Appropriate  Thought Process:  Linear and Descriptions of Associations: Intact  Orientation: Fully alert and attentive  Thought Content:  Denies  hallucinations, no delusions expressed, not internally preoccupied  Suicidal Thoughts:  No-today denies suicidal ideations, contracts for safety on unit, also denies homicidal ideations.   Homicidal Thoughts:  No  Memory:  Recent and remote grossly intact  Judgement:  Other:  Improving  Insight:  Improving  Psychomotor Activity:  Normal  Concentration:  Concentration: Good and Attention Span: Good  Recall:  Good  Fund of Knowledge:  Good  Language:  Good  Akathisia:  Negative  Handed:  Right  AIMS (if indicated):     Assets:  Communication Skills Desire for Improvement Housing Resilience Social Support Talents/Skills  ADL's:  Intact  Cognition:  WNL  Sleep:  Number of Hours: 6.25   Assessment-62 year old female, history of depression, presented following impulsive, unplanned suicide attempt by overdosing on Xanax, which she states was  triggered by stressful relationship with her daughter.   At this time patient is reporting improving mood , presenting with  fuller range of affect, denies SI and is future oriented . Vaguely ruminative, anxious at times- reports sister wants to establish outpatient cardiology care locally due to transportation constraints , but reports she is ambivalent , as she has a long /good therapeutic alliance with her cardiologist in [place].  Denies medication side effects at this time  Treatment Plan Summary: Treatment plan reviewed as below today August 7 Encourage group and milieu participation to work on Radiographer, therapeutic and symptom reduction Continue  Xanax  0.5 mg twice daily as needed anxiety or insomnia Continue Effexor XR 75 mg daily for depression Continue anticoagulation management(chronic) -on Plavix and Coumadin.  Coumadin management as per  pharmacy/protocol. Continue Synthroid 200 mcg for hypothyroidism. Treatment team working on disposition planning options.   Jenne Campus, MD 09/09/2017, 4:42 PM   Patient ID: Ana Martin, female   DOB: 1955/12/15, 62 y.o.   MRN: [id number]

## 2017-09-09 NOTE — Progress Notes (Signed)
Pt presents with an anxious mood. Pt rated on her self inventory sheet: depression 5/10, anxiety 0, hopelessness 5/10. Pt denies SI/HI. Pt reported fair sleep last night. Pt stated goal for today, "just focus on the right path and no stress". Pt verbalized readiness for discharge and plans to stay with her sister for two weeks for continued support. Pt c/o of generalized body aches this morning and requested Tylenol for pain. Tylenol administered to pt at her request. Pt verbalized tolerating her meds well and denies any side effects.   Orders and medications reviewed with pt. V/s assessed. B/p initially elevated, b/p rechecked and is now stable. Verbal support provided. Pt encouraged to attend groups. 15 minute checks performed for safety.  Pt compliant with tx plan.

## 2017-09-10 ENCOUNTER — Encounter (HOSPITAL_COMMUNITY): Payer: Self-pay | Admitting: Behavioral Health

## 2017-09-10 DIAGNOSIS — Z95810 Presence of automatic (implantable) cardiac defibrillator: Secondary | ICD-10-CM | POA: Diagnosis not present

## 2017-09-10 DIAGNOSIS — I509 Heart failure, unspecified: Secondary | ICD-10-CM | POA: Diagnosis not present

## 2017-09-10 LAB — PROTIME-INR
INR: 2.73
Prothrombin Time: 28.7 seconds — ABNORMAL HIGH (ref 11.4–15.2)

## 2017-09-10 MED ORDER — VENLAFAXINE HCL ER 75 MG PO CP24: 75.0000 mg | ORAL_CAPSULE | Freq: Every day | ORAL | 0 refills | Status: DC

## 2017-09-10 MED ORDER — FUROSEMIDE 40 MG PO TABS: 40.0000 mg | ORAL_TABLET | Freq: Every day | ORAL | 0 refills | Status: AC

## 2017-09-10 MED ORDER — AMIODARONE HCL 200 MG PO TABS: 200.0000 mg | ORAL_TABLET | Freq: Every day | ORAL | 0 refills | Status: AC

## 2017-09-10 MED ORDER — HYDROXYZINE HCL 25 MG PO TABS: 25.0000 mg | ORAL_TABLET | Freq: Three times a day (TID) | ORAL | 0 refills | Status: DC | PRN

## 2017-09-10 MED ORDER — WARFARIN SODIUM 1 MG PO TABS
3.0000 mg | ORAL_TABLET | Freq: Once | ORAL | Status: DC
  Filled 2017-09-10: qty 3

## 2017-09-10 MED ORDER — GUAIFENESIN ER 600 MG PO TB12: 1200.0000 mg | ORAL_TABLET | Freq: Two times a day (BID) | ORAL | 0 refills | Status: DC

## 2017-09-10 MED ORDER — LEVOTHYROXINE SODIUM 200 MCG PO TABS: 200.0000 ug | ORAL_TABLET | Freq: Every day | ORAL | 0 refills | Status: DC

## 2017-09-10 MED ORDER — CARVEDILOL 12.5 MG PO TABS: 12.5000 mg | ORAL_TABLET | Freq: Every day | ORAL | 0 refills | Status: DC

## 2017-09-10 NOTE — Progress Notes (Signed)
Discharge note: Patient reviewed discharge paperwork with RN including prescriptions, follow up appointments, and lab work. Patient given the opportunity to ask questions. All concerns were addressed. All belongings were returned to patient. Denied SI/HI/AVH. Patient thanked staff for their care while at the hospital.  Patient was discharged to the lobby where her sister was there to pick her up.

## 2017-09-10 NOTE — BHH Group Notes (Signed)
[facility] LCSW Group Therapy Note  Date/Time: 09/10/17, 1315  Type of Therapy/Topic:  Group Therapy:  Balance in Life  Participation Level:  active  Description of Group:    This group will address the concept of balance and how it feels and looks when one is unbalanced. Patients will be encouraged to process areas in their lives that are out of balance, and identify reasons for remaining unbalanced. Facilitators will guide patients utilizing problem- solving interventions to address and correct the stressor making their life unbalanced. Understanding and applying boundaries will be explored and addressed for obtaining  and maintaining a balanced life. Patients will be encouraged to explore ways to assertively make their unbalanced needs known to significant others in their lives, using other group members and facilitator for support and feedback.  Therapeutic Goals: 1. Patient will identify two or more emotions or situations they have that consume much of in their lives. 2. Patient will identify signs/triggers that life has become out of balance:  3. Patient will identify two ways to set boundaries in order to achieve balance in their lives:  4. Patient will demonstrate ability to communicate their needs through discussion and/or role plays  Summary of Patient Progress:Pt shared that physical and family are the areas that are out of balance in her life.  Pt made was active during the group discussion and was attentive throughout as we discussed ways to respond to areas of life that get out of balance.           Therapeutic Modalities:   Cognitive Behavioral Therapy Solution-Focused Therapy Assertiveness Training  Lurline Idol, [place]

## 2017-09-10 NOTE — Progress Notes (Signed)
ANTICOAGULATION CONSULT NOTE - Follow Up Consult  Pharmacy Consult for Warfarin Indication: atrial fibrillation--mechanical mitral valve  Allergies  Allergen Reactions  . Codeine Nausea Only    Patient Measurements: Height: 5\' 7"  (170.2 cm) Weight: 203 lb (92.1 kg) IBW/kg (Calculated) : 61.6 Heparin Dosing Weight:   Vital Signs: Temp: 97.9 F (36.6 C) (08/08 0610) Temp Source: Oral (08/08 0610) BP: 146/72 (08/08 0612) Pulse Rate: 70 (08/08 0612)  Labs: Recent Labs    09/08/17 0643 09/09/17 0640 09/10/17 0621  LABPROT 29.0* 27.3* 28.7*  INR 2.77 2.56 2.73  CREATININE  --  1.16*  --     Estimated Creatinine Clearance: 57.8 mL/min (A) (by C-G formula based on SCr of 1.16 mg/dL (H)).   Medications:  Scheduled:  . amiodarone  200 mg Oral Daily  . atorvastatin  20 mg Oral q1800  . carvedilol  12.5 mg Oral Q breakfast  . carvedilol  25 mg Oral Q supper  . clopidogrel  75 mg Oral Daily  . fenofibrate  160 mg Oral Daily  . fluticasone  2 puff Inhalation BID  . furosemide  40 mg Oral Daily  . guaiFENesin  1,200 mg Oral BID  . ibuprofen  800 mg Oral Once  . ipratropium-albuterol  3 mL Nebulization TID  . levothyroxine  200 mcg Oral QAC breakfast  . lisinopril  40 mg Oral Daily  . venlafaxine XR  75 mg Oral Q breakfast  . Warfarin - Pharmacist Dosing Inpatient   Does not apply q1800   PRN: acetaminophen, albuterol, ALPRAZolam, alum & mag hydroxide-simeth, hydrOXYzine, magnesium hydroxide  Assessment: INR=2.73 Goal of Therapy:  INR 2.5-3.5 Monitor platelets by anticoagulation protocol: Yes Plan:  Warfarin 3mg  po x1 at 1800  Gypsy Decant 09/10/2017,10:34 AM

## 2017-09-10 NOTE — Discharge Summary (Addendum)
Physician Discharge Summary Note  Patient:  Ana Martin is an 62 y.o., female MRN:  [id number] DOB:  09/29/1955 Patient phone:  [phone] (home)  Patient address:   [address] [place] [address],  Total Time spent with patient: 30 minutes  Date of Admission:  09/04/2017 Date of Discharge: 09/10/2017  Reason for Admission:  intentional overdose   Principal Problem: MDD (major depressive disorder), recurrent severe, without psychosis [facility]) Discharge Diagnoses: Patient Active Problem List   Diagnosis Date Noted  . MDD (major depressive disorder), recurrent severe, without psychosis ([place]) [F33.2] 09/04/2017  . Elevated INR [R79.1] 08/31/2017  . Syncope [R55] 08/31/2017  . Anticoagulant overdosage [T45.511A] 08/31/2017  . Wolff-Parkinson-White syndrome [I45.6] 08/31/2017  . COPD (chronic obstructive pulmonary disease) ([place]) [J44.9] 08/31/2017  . Pleuritic chest pain [R07.81] 08/31/2017  . Former smoker [Q03.474] 08/31/2017  . Anxiety [F41.9]   . Depression [F32.9]   . Hypertension [I10]   . Hyperlipidemia [E78.5]   . Thyroid disease [E07.9]   . Other abnormal glucose [R73.09]   . Automatic implantable cardiac defibrillator in situ [Z95.810]   . Atrial fibrillation ([place]) [I48.91]   . Other left bundle branch block [I44.7]   . 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 Hypersomnia, unspecified [G47.10]   . Lump or mass in breast [N63.0]   . Unspecified vitamin D deficiency [E55.9]   . Thyroid cancer ([place]) [C73]     Past Psychiatric History: Patient stated that she has a long history of depression.  She is been treated for depression over 20 years with antidepressant medicines as well as Xanax.  She stated she had previously taken Prozac as well as Zoloft.  She stated the Prozac was recently increased.  She states she takes Xanax at least once a day, and sometimes twice a day.  She denied any previous psychiatric admissions.  Her primary care provider writes for  her antidepressant medications.  Past Medical History:  Past Medical History:  Diagnosis Date  . Anxiety   . Atrial fibrillation ([employer])   . Automatic implantable cardiac defibrillator in situ    [place]um[place] DR-T 09-20-2007 Dr.Akbary  . Depression   . Hyperlipidemia   . 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 Hypertension   . Lump or mass in breast    at the 7 o'clock position of right breast(non-tender)  . Other abnormal glucose   . Other left bundle branch block   . Other primary cardiomyopathies   . Thyroid cancer ([place])   . Thyroid disease   . Unspecified vitamin D deficiency   . Wolff-Parkinson-White syndrome     Past Surgical History:  Procedure Laterality Date  . ABDOMINAL HYSTERECTOMY    . CARDIAC VALVE REPLACEMENT     st jude silzone mitray mechanical [id number] ss# [id number] . COLONOSCOPY     April 12, 2008  . PACEMAKER PLACEMENT     Family History:  Family History  Problem Relation Age of Onset  . Lung cancer Father        expired 663 . Sarcoidosis Mother        espired 2013   Family Psychiatric  History: Denied Social History:  Social History   Substance and Sexual Activity  Alcohol Use No     Social History   Substance and Sexual Activity  Drug Use No    Social History   Socioeconomic History  . Marital status: Legally Separated    Spouse name:  Not on file  . Number of children: Not on file  . Years of education: Not on file  . Highest education level: Not on file  Occupational History  . Not on file  Social Needs  . Financial resource strain: Not on file  . Food insecurity:    Worry: Not on file    Inability: Not on file  . Transportation needs:    Medical: Not on file    Non-medical: Not on file  Tobacco Use  . Smoking status: Former Smoker    Packs/day: 1.00    Years: 25.00    Pack years: 25.00    Types: Cigarettes    Last attempt to quit: 10/17/2009    Years since quitting: 7.9  . Smokeless tobacco: Never Used   Substance and Sexual Activity  . Alcohol use: No  . Drug use: No  . Sexual activity: Not Currently  Lifestyle  . Physical activity:    Days per week: Not on file    Minutes per session: Not on file  . Stress: Not on file  Relationships  . Social connections:    Talks on phone: Not on file    Gets together: Not on file    Attends religious service: Not on file    Active member of club or organization: Not on file    Attends meetings of clubs or organizations: Not on file    Relationship status: Not on file  Other Topics Concern  . Not on file  Social History Narrative  . Not on file    Hospital Course:  Patient is seen and examined.  Patient is a 62 year old female with a past psychiatric history significant for depression anxiety who recently took an intentional overdose of Xanax and a suicide attempt.  She presented to the [facility] emergency room on 7/29.  She had a fall at home.  She fell onto her closet.  She denied any loss of consciousness.  Her drug screen on admission was significant for amphetamines as well as benzodiazepines.  She was admitted to the medical side of the hospital initially.  She has a past medical history significant for chronic anticoagulation, hypertension, mechanical heart valve, WPW, automated defibrillator and history of thyroid cancer.  She was stabilized in the medical hospital.  She was actually started on steroids for a COPD exacerbation.  She was transferred to our hospital for evaluation and stabilization.  Patient stated that she had been treated for depression and anxiety for years with Prozac, Zoloft, Xanax.  She stated that most recently her Prozac had been increased to 60 mg p.o. daily.  She has not seen a psychiatrist.  She gets her psychiatric medicines from her primary care provider.  She stated that her daughter has some emotional and what sounds like substance issues, and that she is the primary caretaker of 2 of her grandchildren.  Her  19 year old grandchild recently moved in with her.  Her daughter is been trying to come into the home, and take more of a role with the children.  The patient and her daughter got into an argument, and she was upset with this.  She took the overdose of Xanax.  Apparently she took 30-1 mg Xanax tablets.  Her drug screen also had amphetamines in it.  Her grandchild takes stimulants for attention deficit disorder issues, but does not recall taking that medication.  She stated that the overdose was a mistake, and she feels bad about it and is remorseful.  She did state that even prior to the event the Prozac was not really helping her depression.  She was admitted to the hospital for evaluation and stabilization.  Seairra was started on medication regimen for presenting symptoms. She was medicated & discharged on;   Effexor XR 75 mg daily for depression Continue anticoagulation management(chronic) -on Plavix and Coumadin.  Coumadin management as per pharmacy/protocol. She was told to resume these medications as noted below. Synthroid 200 mcg for hypothyroidism. Vistaril 25 mg po TID as needed for anxiety  (patient was advised to resume other medications as noted below. RX was not given for Xanax as this is patients home medication and refills she be requested from primary provider). Patient has been adherent with treatment recommendations. Patient tolerated the medications without any reported side effects are adverse reactions.  Patient was enrolled & participated in the group counseling sessions being offerred & held on this unit. Patient learned coping skills.  Raelyn Mora is seen today by the attending psychiatrist for discharge. Patient denies any delusions, no hallucinations or other psychotic process. Patient denies active or passive suicidal thoughts. No thoughts of violence.  Endorses overall improvement in mood emotional state.    Nursing staff reports that patient has been appropriate on the unit.  Patient has been interacting well with peers. No behavioral issues. Patient has not voiced any suicidal thoughts. Prior to discharge. Patient was discussed at the treatment team meeting this morning. Team members feels that patient is back to his baseline level of functioning. Team agrees with plan to discharge patient today. Patient was provided with all follow-up information to resume mental health treatment following discharge as noted below. Wannetta  was provided with a prescription for her [facility] discharge medications.  Patient left [facility] with all personal belongings in no apparent distress. Transportation per patient/ family arrangement.     Labs:  Recent Results (from the past 2160 hour(s))  Rapid urine drug screen (hospital performed)     Status: Abnormal   Collection Time: 08/30/17 10:45 PM  Result Value Ref Range   Opiates NONE DETECTED NONE DETECTED   Cocaine NONE DETECTED NONE DETECTED   Benzodiazepines POSITIVE (A) NONE DETECTED   Amphetamines POSITIVE (A) NONE DETECTED   Tetrahydrocannabinol NONE DETECTED NONE DETECTED   Barbiturates NONE DETECTED NONE DETECTED    Comment: (NOTE) DRUG SCREEN FOR MEDICAL PURPOSES ONLY.  IF CONFIRMATION IS NEEDED FOR ANY PURPOSE, NOTIFY LAB WITHIN 5 DAYS. LOWEST DETECTABLE LIMITS FOR URINE DRUG SCREEN Drug Class                     Cutoff (ng/mL) Amphetamine and metabolites    1000 Barbiturate and metabolites    200 Benzodiazepine                 614 Tricyclics and metabolites     300 Opiates and metabolites        300 Cocaine and metabolites        300 THC                            50 Performed at [facility], [address] [place], [place] [address]   Urinalysis, Routine w reflex microscopic     Status: None   Collection Time: 08/30/17 10:45 PM  Result Value Ref Range   Color, Urine YELLOW YELLOW   APPearance CLEAR CLEAR   Specific Gravity, Urine 1.005 1.005 - 1.030   pH 6.0  5.0 - 8.0   Glucose, UA NEGATIVE NEGATIVE mg/dL   Hgb urine  dipstick NEGATIVE NEGATIVE   Bilirubin Urine NEGATIVE NEGATIVE   Ketones, ur NEGATIVE NEGATIVE mg/dL   Protein, ur NEGATIVE NEGATIVE mg/dL   Nitrite NEGATIVE NEGATIVE   Leukocytes, UA NEGATIVE NEGATIVE    Comment: Performed at [facility], [address], [place], [place] [address]  Protime-INR     Status: Abnormal   Collection Time: 08/30/17 10:45 PM  Result Value Ref Range   Prothrombin Time >90.0 (H) 11.4 - 15.2 seconds   INR >10.00 (HH)     Comment: CRITICAL RESULT CALLED TO, READ BACK BY AND VERIFIED WITH: SAPPELT,J. AT 2334 ON 08/30/2017 BY EVA Performed at [facility], [address], [place], [place] [address]   CBC     Status: Abnormal   Collection Time: 08/30/17 10:45 PM  Result Value Ref Range   WBC 5.4 4.0 - 10.5 K/uL   RBC 4.01 3.87 - 5.11 MIL/uL   Hemoglobin 11.1 (L) 12.0 - 15.0 g/dL   HCT 36.0 36.0 - 46.0 %   MCV 89.8 78.0 - 100.0 fL   MCH 27.7 26.0 - 34.0 pg   MCHC 30.8 30.0 - 36.0 g/dL   RDW 15.4 11.5 - 15.5 %   Platelets 166 150 - 400 K/uL    Comment: Performed at [facility], [address], [place], [place] [address]  Comprehensive metabolic panel     Status: Abnormal   Collection Time: 08/30/17 10:45 PM  Result Value Ref Range   Sodium 138 135 - 145 mmol/L   Potassium 4.1 3.5 - 5.1 mmol/L   Chloride 101 98 - 111 mmol/L   CO2 29 22 - 32 mmol/L   Glucose, Bld 82 70 - 99 mg/dL   BUN 11 8 - 23 mg/dL   Creatinine, Ser 0.84 0.44 - 1.00 mg/dL   Calcium 9.0 8.9 - 10.3 mg/dL   Total Protein 7.2 6.5 - 8.1 g/dL   Albumin 3.8 3.5 - 5.0 g/dL   AST 51 (H) 15 - 41 U/L   ALT 22 0 - 44 U/L   Alkaline Phosphatase 27 (L) 38 - 126 U/L   Total Bilirubin 1.2 0.3 - 1.2 mg/dL   GFR calc non Af Amer >60 >60 mL/min   GFR calc Af Amer >60 >60 mL/min    Comment: (NOTE) The eGFR has been calculated using the CKD EPI equation. This calculation has not been validated in all clinical situations. eGFR's persistently <60 mL/min signify possible Chronic Kidney Disease.     Anion gap 8 5 - 15    Comment: Performed at [facility], [address] [place], [place] [address]  Ethanol     Status: None   Collection Time: 08/30/17 10:45 PM  Result Value Ref Range   Alcohol, Ethyl (B) <10 <10 mg/dL    Comment: (NOTE) Lowest detectable limit for serum alcohol is 10 mg/dL. For medical purposes only. Performed at [facility], [address], [place], [place] [address]   Blood gas, venous     Status: Abnormal   Collection Time: 08/30/17 10:45 PM  Result Value Ref Range   FIO2 21.00    Delivery systems ROOM AIR    pH, Ven 7.324 7.250 - 7.430   pCO2, Ven 58.8 44.0 - 60.0 mmHg   pO2, Ven 34.7 32.0 - 45.0 mmHg   Bicarbonate 26.4 20.0 - 28.0 mmol/L   Acid-Base Excess 4.1 (H) 0.0 - 2.0 mmol/L   O2 Saturation  55.0 %   Collection site VEIN    Drawn by [phone]    Sample type VEIN     Comment: Performed at [facility], [address] [place], [place] [address]  Prepare fresh frozen plasma     Status: None   Collection Time: 08/30/17 11:39 PM  Result Value Ref Range   Unit Number [id number]    Blood Component Type THAWED PLASMA    Unit division 00    Status of Unit ISSUED,FINAL    Transfusion Status      OK TO TRANSFUSE Performed at [facility], [address] [place], [place] [address]    Unit Number [id number]    Blood Component Type THWPLS APHR1    Unit division 00    Status of Unit ISSUED,FINAL    Transfusion Status OK TO TRANSFUSE   BPAM FFP     Status: None   Collection Time: 08/30/17 11:39 PM  Result Value Ref Range   ISSUE DATE / TIME [id number]    Blood Product Unit Number [id number]    Unit Type and Rh 5100    Blood Product Expiration Date [id number]    ISSUE DATE / TIME [id number]    Blood Product Unit Number [id number]    Unit Type and Rh 5100    Blood Product Expiration Date [id number]   Type and screen [facility]     Status: None   Collection Time: 08/30/17 11:43 PM  Result Value Ref Range   ABO/RH(D) O NEG     Antibody Screen NEG    Sample Expiration      09/02/2017 Performed at [facility], [address], [place], [place] [address]   HIV antibody (Routine Testing)     Status: None   Collection Time: 08/31/17  6:05 AM  Result Value Ref Range   HIV Screen 4th Generation wRfx Non Reactive Non Reactive    Comment: (NOTE) Performed At: [facility] [place], [place] [id number] Rush Farmer MD [id number]   CBC     Status: Abnormal   Collection Time: 08/31/17  6:05 AM  Result Value Ref Range   WBC 5.3 4.0 - 10.5 K/uL   RBC 3.66 (L) 3.87 - 5.11 MIL/uL   Hemoglobin 10.0 (L) 12.0 - 15.0 g/dL   HCT 32.9 (L) 36.0 - 46.0 %   MCV 89.9 78.0 - 100.0 fL   MCH 27.3 26.0 - 34.0 pg   MCHC 30.4 30.0 - 36.0 g/dL   RDW 15.4 11.5 - 15.5 %   Platelets 155 150 - 400 K/uL    Comment: Performed at [facility], [address] [place], [place] [address]  Comprehensive metabolic panel     Status: Abnormal   Collection Time: 08/31/17  6:05 AM  Result Value Ref Range   Sodium 138 135 - 145 mmol/L   Potassium 3.7 3.5 - 5.1 mmol/L   Chloride 102 98 - 111 mmol/L   CO2 30 22 - 32 mmol/L   Glucose, Bld 80 70 - 99 mg/dL   BUN 10 8 - 23 mg/dL   Creatinine, Ser 0.79 0.44 - 1.00 mg/dL   Calcium 8.8 (L) 8.9 - 10.3 mg/dL   Total Protein 6.8 6.5 - 8.1 g/dL   Albumin 3.7 3.5 - 5.0 g/dL   AST 42 (H) 15 - 41 U/L   ALT 19 0 - 44 U/L   Alkaline Phosphatase 29 (L) 38 - 126 U/L   Total Bilirubin 1.1 0.3 - 1.2 mg/dL  GFR calc non Af Amer >60 >60 mL/min   GFR calc Af Amer >60 >60 mL/min    Comment: (NOTE) The eGFR has been calculated using the CKD EPI equation. This calculation has not been validated in all clinical situations. eGFR's persistently <60 mL/min signify possible Chronic Kidney Disease.    Anion gap 6 5 - 15    Comment: Performed at [facility], [address], [place], [place] [address]  Protime-INR     Status: Abnormal   Collection Time: 08/31/17  6:05 AM  Result Value Ref  Range   Prothrombin Time 37.3 (H) 11.4 - 15.2 seconds   INR 3.82     Comment: Performed at [facility], [address], [place], [place] [address]  ECHOCARDIOGRAM COMPLETE     Status: None   Collection Time: 08/31/17 12:59 PM  Result Value Ref Range   Weight 3,264 oz   Height 67 in   BP 121/67 mmHg  Protime-INR     Status: Abnormal   Collection Time: 09/01/17  6:08 AM  Result Value Ref Range   Prothrombin Time 22.8 (H) 11.4 - 15.2 seconds   INR 2.03     Comment: Performed at [facility], [address], [place], [place] [address]  CBC     Status: Abnormal   Collection Time: 09/01/17  6:08 AM  Result Value Ref Range   WBC 6.4 4.0 - 10.5 K/uL   RBC 3.57 (L) 3.87 - 5.11 MIL/uL   Hemoglobin 9.8 (L) 12.0 - 15.0 g/dL   HCT 32.0 (L) 36.0 - 46.0 %   MCV 89.6 78.0 - 100.0 fL   MCH 27.5 26.0 - 34.0 pg   MCHC 30.6 30.0 - 36.0 g/dL   RDW 15.2 11.5 - 15.5 %   Platelets 160 150 - 400 K/uL    Comment: Performed at [facility], [address] [place], [place] [address]  Comprehensive metabolic panel     Status: Abnormal   Collection Time: 09/01/17  6:08 AM  Result Value Ref Range   Sodium 137 135 - 145 mmol/L   Potassium 4.1 3.5 - 5.1 mmol/L   Chloride 101 98 - 111 mmol/L   CO2 29 22 - 32 mmol/L   Glucose, Bld 101 (H) 70 - 99 mg/dL   BUN 13 8 - 23 mg/dL   Creatinine, Ser 0.86 0.44 - 1.00 mg/dL   Calcium 8.7 (L) 8.9 - 10.3 mg/dL   Total Protein 7.1 6.5 - 8.1 g/dL   Albumin 3.8 3.5 - 5.0 g/dL   AST 32 15 - 41 U/L   ALT 17 0 - 44 U/L   Alkaline Phosphatase 31 (L) 38 - 126 U/L   Total Bilirubin 1.2 0.3 - 1.2 mg/dL   GFR calc non Af Amer >60 >60 mL/min   GFR calc Af Amer >60 >60 mL/min    Comment: (NOTE) The eGFR has been calculated using the CKD EPI equation. This calculation has not been validated in all clinical situations. eGFR's persistently <60 mL/min signify possible Chronic Kidney Disease.    Anion gap 7 5 - 15    Comment: Performed at [facility], [address],  [place], [place] [address]  Magnesium     Status: None   Collection Time: 09/01/17  6:08 AM  Result Value Ref Range   Magnesium 1.7 1.7 - 2.4 mg/dL    Comment: Performed at [facility], [address] [place], [place] [address]  Protime-INR     Status: Abnormal  Collection Time: 09/02/17  5:01 AM  Result Value Ref Range   Prothrombin Time 19.3 (H) 11.4 - 15.2 seconds   INR 1.64     Comment: Performed at [facility], [address] [place], [place] [address]  CBC     Status: Abnormal   Collection Time: 09/02/17  5:01 AM  Result Value Ref Range   WBC 6.0 4.0 - 10.5 K/uL   RBC 3.55 (L) 3.87 - 5.11 MIL/uL   Hemoglobin 9.6 (L) 12.0 - 15.0 g/dL   HCT 32.1 (L) 36.0 - 46.0 %   MCV 90.4 78.0 - 100.0 fL   MCH 27.0 26.0 - 34.0 pg   MCHC 29.9 (L) 30.0 - 36.0 g/dL   RDW 15.2 11.5 - 15.5 %   Platelets 153 150 - 400 K/uL    Comment: Performed at [facility], [address], [place], [place] [address]  Comprehensive metabolic panel     Status: Abnormal   Collection Time: 09/02/17  5:01 AM  Result Value Ref Range   Sodium 138 135 - 145 mmol/L   Potassium 4.6 3.5 - 5.1 mmol/L   Chloride 100 98 - 111 mmol/L   CO2 31 22 - 32 mmol/L   Glucose, Bld 199 (H) 70 - 99 mg/dL   BUN 20 8 - 23 mg/dL   Creatinine, Ser 0.98 0.44 - 1.00 mg/dL   Calcium 8.9 8.9 - 10.3 mg/dL   Total Protein 7.3 6.5 - 8.1 g/dL   Albumin 3.8 3.5 - 5.0 g/dL   AST 30 15 - 41 U/L   ALT 17 0 - 44 U/L   Alkaline Phosphatase 31 (L) 38 - 126 U/L   Total Bilirubin 1.1 0.3 - 1.2 mg/dL   GFR calc non Af Amer >60 >60 mL/min   GFR calc Af Amer >60 >60 mL/min    Comment: (NOTE) The eGFR has been calculated using the CKD EPI equation. This calculation has not been validated in all clinical situations. eGFR's persistently <60 mL/min signify possible Chronic Kidney Disease.    Anion gap 7 5 - 15    Comment: Performed at [facility], [address] [place], [place] [address]  Heparin level (unfractionated)     Status: Abnormal    Collection Time: 09/02/17  4:18 PM  Result Value Ref Range   Heparin Unfractionated 0.20 (L) 0.30 - 0.70 IU/mL    Comment: (NOTE) If heparin results are below expected values, and patient dosage has  been confirmed, suggest follow up testing of antithrombin III levels. Performed at [facility], [address], [place], [place] [address]   Heparin level (unfractionated)     Status: Abnormal   Collection Time: 09/02/17 11:11 PM  Result Value Ref Range   Heparin Unfractionated 0.24 (L) 0.30 - 0.70 IU/mL    Comment: (NOTE) If heparin results are below expected values, and patient dosage has  been confirmed, suggest follow up testing of antithrombin III levels. Performed at [facility], [address], [place], [place] [address]   Heparin level (unfractionated)     Status: None   Collection Time: 09/03/17  7:39 AM  Result Value Ref Range   Heparin Unfractionated 0.67 0.30 - 0.70 IU/mL    Comment: (NOTE) If heparin results are below expected values, and patient dosage has  been confirmed, suggest follow up testing of antithrombin III levels. Performed at [facility], [address] [place], [place] [address]   CBC     Status: Abnormal   Collection Time: 09/03/17  7:39 AM  Result Value Ref Range   WBC 10.4 4.0 - 10.5 K/uL   RBC 3.60 (L) 3.87 - 5.11 MIL/uL   Hemoglobin 9.7 (L) 12.0 - 15.0 g/dL   HCT 32.3 (L) 36.0 - 46.0 %   MCV 89.7 78.0 - 100.0 fL   MCH 26.9 26.0 - 34.0 pg   MCHC 30.0 30.0 - 36.0 g/dL   RDW 15.1 11.5 - 15.5 %   Platelets 158 150 - 400 K/uL    Comment: Performed at [facility], [address], [place], [place] [address]  Comprehensive metabolic panel     Status: Abnormal   Collection Time: 09/03/17  7:39 AM  Result Value Ref Range   Sodium 138 135 - 145 mmol/L   Potassium 4.7 3.5 - 5.1 mmol/L   Chloride 100 98 - 111 mmol/L   CO2 32 22 - 32 mmol/L   Glucose, Bld 154 (H) 70 - 99 mg/dL   BUN 27 (H) 8 - 23 mg/dL   Creatinine, Ser 0.89 0.44 - 1.00 mg/dL   Calcium  9.1 8.9 - 10.3 mg/dL   Total Protein 7.3 6.5 - 8.1 g/dL   Albumin 3.8 3.5 - 5.0 g/dL   AST 25 15 - 41 U/L   ALT 15 0 - 44 U/L   Alkaline Phosphatase 33 (L) 38 - 126 U/L   Total Bilirubin 1.1 0.3 - 1.2 mg/dL   GFR calc non Af Amer >60 >60 mL/min   GFR calc Af Amer >60 >60 mL/min    Comment: (NOTE) The eGFR has been calculated using the CKD EPI equation. This calculation has not been validated in all clinical situations. eGFR's persistently <60 mL/min signify possible Chronic Kidney Disease.    Anion gap 6 5 - 15    Comment: Performed at [facility], [address], [place], [place] [address]  Protime-INR     Status: Abnormal   Collection Time: 09/03/17  7:39 AM  Result Value Ref Range   Prothrombin Time 21.6 (H) 11.4 - 15.2 seconds   INR 1.89     Comment: Performed at [facility], [address], [place], [place] [address]  Protime-INR     Status: Abnormal   Collection Time: 09/04/17  4:15 AM  Result Value Ref Range   Prothrombin Time 24.7 (H) 11.4 - 15.2 seconds   INR 2.25     Comment: Performed at [facility], [address] [place], [place] [address]  CBC     Status: Abnormal   Collection Time: 09/04/17  4:15 AM  Result Value Ref Range   WBC 8.9 4.0 - 10.5 K/uL   RBC 3.53 (L) 3.87 - 5.11 MIL/uL   Hemoglobin 9.5 (L) 12.0 - 15.0 g/dL   HCT 31.8 (L) 36.0 - 46.0 %   MCV 90.1 78.0 - 100.0 fL   MCH 26.9 26.0 - 34.0 pg   MCHC 29.9 (L) 30.0 - 36.0 g/dL   RDW 15.6 (H) 11.5 - 15.5 %   Platelets 178 150 - 400 K/uL    Comment: Performed at [facility], [address] [place], [place] [address]  Comprehensive metabolic panel     Status: Abnormal   Collection Time: 09/04/17  4:15 AM  Result Value Ref Range   Sodium 136 135 - 145 mmol/L   Potassium 4.6 3.5 - 5.1 mmol/L   Chloride 99 98 - 111 mmol/L   CO2 29 22 - 32 mmol/L   Glucose, Bld 203 (H) 70 - 99 mg/dL  BUN 37 (H) 8 - 23 mg/dL   Creatinine, Ser 1.02 (H) 0.44 - 1.00 mg/dL   Calcium 8.8 (L) 8.9 - 10.3 mg/dL   Total  Protein 6.8 6.5 - 8.1 g/dL   Albumin 3.7 3.5 - 5.0 g/dL   AST 26 15 - 41 U/L   ALT 18 0 - 44 U/L   Alkaline Phosphatase 34 (L) 38 - 126 U/L   Total Bilirubin 0.9 0.3 - 1.2 mg/dL   GFR calc non Af Amer 57 (L) >60 mL/min   GFR calc Af Amer >60 >60 mL/min    Comment: (NOTE) The eGFR has been calculated using the CKD EPI equation. This calculation has not been validated in all clinical situations. eGFR's persistently <60 mL/min signify possible Chronic Kidney Disease.    Anion gap 8 5 - 15    Comment: Performed at [facility], [address] [place], [place] [address]  TSH     Status: None   Collection Time: 09/05/17  6:12 AM  Result Value Ref Range   TSH 4.139 0.350 - 4.500 uIU/mL    Comment: Performed by a 3rd Generation assay with a functional sensitivity of <=0.01 uIU/mL. Performed at [facility], [place] [address] [place], [place] [address]   Protime-INR     Status: Abnormal   Collection Time: 09/05/17  6:12 AM  Result Value Ref Range   Prothrombin Time 28.3 (H) 11.4 - 15.2 seconds   INR 2.68     Comment: Performed at [facility], [place] [address] [place], [place] [address]  Protime-INR     Status: Abnormal   Collection Time: 09/06/17  6:19 AM  Result Value Ref Range   Prothrombin Time 31.6 (H) 11.4 - 15.2 seconds   INR 3.09     Comment: Performed at [facility], [place] [address], [place], [place] [address]  Protime-INR     Status: Abnormal   Collection Time: 09/07/17  6:40 AM  Result Value Ref Range   Prothrombin Time 30.6 (H) 11.4 - 15.2 seconds   INR 2.96     Comment: Performed at [facility], [place] [address] [place], [place] [address]  Protime-INR     Status: Abnormal   Collection Time: 09/08/17  6:43 AM  Result Value Ref Range   Prothrombin Time 29.0 (H) 11.4 - 15.2 seconds   INR 2.77     Comment: Performed at [facility], [place] [address], [place], [place] [address]   Protime-INR     Status: Abnormal   Collection Time: 09/09/17  6:40 AM  Result Value Ref Range   Prothrombin Time 27.3 (H) 11.4 - 15.2 seconds   INR 2.56     Comment: Performed at [facility], [place] [address], [place], [place] [address]  Basic metabolic panel     Status: Abnormal   Collection Time: 09/09/17  6:40 AM  Result Value Ref Range   Sodium 141 135 - 145 mmol/L   Potassium 4.0 3.5 - 5.1 mmol/L   Chloride 99 98 - 111 mmol/L   CO2 31 22 - 32 mmol/L   Glucose, Bld 90 70 - 99 mg/dL   BUN 36 (H) 8 - 23 mg/dL   Creatinine, Ser 1.16 (H) 0.44 - 1.00 mg/dL   Calcium 9.5 8.9 - 10.3 mg/dL   GFR calc non Af Amer 49 (L) >60 mL/min   GFR calc Af Amer 57 (L) >60 mL/min    Comment: (NOTE)  The eGFR has been calculated using the CKD EPI equation. This calculation has not been validated in all clinical situations. eGFR's persistently <60 mL/min signify possible Chronic Kidney Disease.    Anion gap 11 5 - 15    Comment: Performed at [facility], [place] [address] [place], [place] [address]  Magnesium     Status: None   Collection Time: 09/09/17  6:40 AM  Result Value Ref Range   Magnesium 2.1 1.7 - 2.4 mg/dL    Comment: Performed at [facility], [place] [address] [place], [place] [address]  Protime-INR     Status: Abnormal   Collection Time: 09/10/17  6:21 AM  Result Value Ref Range   Prothrombin Time 28.7 (H) 11.4 - 15.2 seconds   INR 2.73     Comment: Performed at [facility], [place] [address] [place], [place] [address]      Physical Findings: AIMS: Facial and Oral Movements Muscles of Facial Expression: None, normal Lips and Perioral Area: None, normal Jaw: None, normal Tongue: None, normal,Extremity Movements Upper (arms, wrists, hands, fingers): None, normal Lower (legs, knees, ankles, toes): None, normal, Trunk Movements Neck, shoulders, hips: None, normal, Overall Severity Severity of abnormal  movements (highest score from questions above): None, normal Incapacitation due to abnormal movements: None, normal Patient's awareness of abnormal movements (rate only patient's report): No Awareness, Dental Status Current problems with teeth and/or dentures?: No Does patient usually wear dentures?: No  CIWA:  CIWA-Ar Total: 1 COWS:  COWS Total Score: 1  Musculoskeletal: Strength & Muscle Tone: within normal limits Gait & Station: normal Patient leans: N/A  Psychiatric Specialty Exam: SEE SRA BY MD  Physical Exam  Nursing note and vitals reviewed. Constitutional: She is oriented to person, place, and time.  Neurological: She is alert and oriented to person, place, and time.    Review of Systems  Psychiatric/Behavioral: Negative for hallucinations, memory loss, substance abuse and suicidal ideas. Depression: improved. Nervous/anxious: improved. Insomnia: improved.     Blood pressure (!) 146/72, pulse 70, temperature 97.9 F (36.6 C), temperature source Oral, resp. rate 16, height _0  (1.702 m), weight 92.1 kg, SpO2 97 %.Body mass index is 31.79 kg/m.    Have you used any form of tobacco in the last 30 days? (Cigarettes, Smokeless Tobacco, Cigars, and/or Pipes): No  Has this patient used any form of tobacco in the last 30 days? (Cigarettes, Smokeless Tobacco, Cigars, and/or Pipes)  N/A  Blood Alcohol level:  Lab Results  Component Value Date   ETH <10 86/76/7209    Metabolic Disorder Labs:  No results found for: HGBA1C, MPG No results found for: PROLACTIN No results found for: CHOL, TRIG, HDL, CHOLHDL, VLDL, LDLCALC  See Psychiatric Specialty Exam and Suicide Risk Assessment completed by Attending Physician prior to discharge.  Discharge destination:  Home  Is patient on multiple antipsychotic therapies at discharge:  No   Has Patient had three or more failed trials of antipsychotic monotherapy by history:  No  Recommended Plan for Multiple Antipsychotic  Therapies: NA   Allergies as of 09/10/2017      Reactions   Codeine Nausea Only      Medication List    STOP taking these medications   acetaminophen 325 MG tablet Commonly known as:  TYLENOL   fenofibrate 160 MG tablet   FLUoxetine 20 MG capsule Commonly known as:  PROZAC   lisinopril 40 MG tablet Commonly known as:  PRINIVIL,ZESTRIL   oxyCODONE-acetaminophen 7.5-325  MG tablet Commonly known as:  PERCOCET   predniSONE 10 MG tablet Commonly known as:  DELTASONE     TAKE these medications     Indication  ALPRAZolam 1 MG tablet Commonly known as:  XANAX Take 0.5 tablets (0.5 mg total) by mouth at bedtime as needed for anxiety.  Indication:  Feeling Anxious   amiodarone 200 MG tablet Commonly known as:  PACERONE Take 1 tablet (200 mg total) by mouth daily. Start taking on:  09/11/2017 What changed:  when to take this  Indication:  cardiac abnormalitly   atorvastatin 20 MG tablet Commonly known as:  LIPITOR Take 1 tablet (20 mg total) by mouth daily at 6 PM.  Indication:  High Amount of Fats in the Blood   carvedilol 12.5 MG tablet Commonly known as:  COREG Take 1 tablet (12.5 mg total) by mouth daily with breakfast. Start taking on:  09/11/2017 What changed:    medication strength  how much to take  when to take this  additional instructions  Indication:  cardiac abnormaility   clopidogrel 75 MG tablet Commonly known as:  PLAVIX Take 75 mg by mouth daily.  Indication:  thrombosis prohalaxis   COUMADIN 2 MG tablet Generic drug:  warfarin Take 2 mg by mouth as directed. 1 tablet daily except 1/2 tablet on Sunday & Wednesday  Indication:  thrombosis   enoxaparin 100 MG/ML injection Commonly known as:  LOVENOX Inject 0.9 mLs (90 mg total) into the skin every 12 (twelve) hours.  Indication:  thrombosis   furosemide 40 MG tablet Commonly known as:  LASIX Take 1 tablet (40 mg total) by mouth daily. Start taking on:  09/11/2017 What changed:    when  to take this  reasons to take this  Indication:  High Blood Pressure Disorder   guaiFENesin 600 MG 12 hr tablet Commonly known as:  MUCINEX Take 2 tablets (1,200 mg total) by mouth 2 (two) times daily.  Indication:  Cough   hydrOXYzine 25 MG tablet Commonly known as:  ATARAX/VISTARIL Take 1 tablet (25 mg total) by mouth 3 (three) times daily as needed for anxiety.  Indication:  Feeling Anxious   ipratropium-albuterol 0.5-2.5 (3) MG/3ML Soln Commonly known as:  DUONEB Take 3 mLs by nebulization 3 (three) times daily.  Indication:  Spasm of Lung Air Passages   levothyroxine 200 MCG tablet Commonly known as:  SYNTHROID, LEVOTHROID Take 1 tablet (200 mcg total) by mouth daily before breakfast. Start taking on:  09/11/2017 What changed:  when to take this  Indication:  Underactive Thyroid   Melatonin 5 MG Tabs Take 5-10 mg by mouth at bedtime.  Indication:  Trouble Sleeping   potassium chloride SA 20 MEQ tablet Commonly known as:  K-DUR,KLOR-CON Take 20 mEq by mouth daily as needed.  Indication:  potassim   PROAIR HFA 108 (90 Base) MCG/ACT inhaler Generic drug:  albuterol Inhale 1 puff into the lungs every 4 (four) hours as needed.  Indication:  Asthma   SYMBICORT 160-4.5 MCG/ACT inhaler Generic drug:  budesonide-formoterol Inhale 2 puffs into the lungs 2 (two) times daily.  Indication:  Asthma   venlafaxine XR 75 MG 24 hr capsule Commonly known as:  EFFEXOR-XR Take 1 capsule (75 mg total) by mouth daily with breakfast. Start taking on:  09/11/2017  Indication:  Major Depressive Disorder      Follow-up Information    [facility]. Go on 09/15/2017.   Why:  Appointment for medication management and therapy services is Tuesday,  09/15/17 at 8:40am. Please be sure to bring your Photo ID, SS card, any insurance information and any discharge paperwork.  Contact information: [place] [address]  [place] [address] [phone]        [facility]. Go on 10/08/2017.   Specialty:  Cardiology Why:  Please attend your appt with Dr Harl Bowie on Thursday, 10/08/17, at 8:30am. Contact information: [place] [phone]          Follow-up recommendations:  Follow up with your outpatient provided for any medical issues. Activity & diet as recommended by your primary care provider.  Comments:  Patient is instructed prior to discharge to: Take all medications as prescribed by his/her mental healthcare provider. Report any adverse effects and or reactions from the medicines to his/her outpatient provider promptly. Patient has been instructed & cautioned: To not engage in alcohol and or illegal drug use while on prescription medicines. In the event of worsening symptoms, patient is instructed to call the crisis hotline, 911 and or go to the nearest ED for appropriate evaluation and treatment of symptoms. To follow-up with his/her primary care provider for your other medical issues, concerns and or health care needs.   Signed: Mordecai Maes, NP 09/10/2017, 9:50 AM   Patient seen, Suicide Assessment Completed.  Disposition Plan Reviewed

## 2017-09-10 NOTE — Progress Notes (Signed)
  [facility] Adult Case Management Discharge Plan :  Will you be returning to the same living situation after discharge:  No. Pt will be staying with her sister. At discharge, do you have transportation home?: Yes,  sister Do you have the ability to pay for your medications: Yes,  [employer]  Release of information consent forms completed and in the chart;  Patient's signature needed at discharge.  Patient to Follow up at: Follow-up Information    [facility]. Go on 09/15/2017.   Why:  Appointment for medication management and therapy services is Tuesday, 09/15/17 at 8:40am. Please be sure to bring your Photo ID, SS card, any insurance information and any discharge paperwork.  Contact information: [place]  [address] [place] [address] [phone]        [facility]. Go on 10/08/2017.   Specialty:  Cardiology Why:  Please attend your appt with Dr Harl Bowie on Thursday, 10/08/17, at 8:30am. Contact information: [place] [phone]          Next level of care provider has access to [place] and Suicide Prevention discussed: Yes,  with sister  Have you used any form of tobacco in the last 30 days? (Cigarettes, Smokeless Tobacco, Cigars, and/or Pipes): No  Has patient been referred to the Quitline?: N/A patient is not a smoker  Patient has been referred for addiction treatment: N/A  [name], LCSW 09/10/2017, 10:29 AM

## 2017-09-10 NOTE — BHH Suicide Risk Assessment (Signed)
[facility] Discharge Suicide Risk Assessment   Principal Problem: depression Discharge Diagnoses:  Patient Active Problem List   Diagnosis Date Noted  . MDD (major depressive disorder), recurrent severe, without psychosis ([place]) [F33.2] 09/04/2017  . Elevated INR [R79.1] 08/31/2017  . Syncope [R55] 08/31/2017  . Anticoagulant overdosage [T45.511A] 08/31/2017  . Wolff-Parkinson-White syndrome [I45.6] 08/31/2017  . COPD (chronic obstructive pulmonary disease) ([place]) [J44.9] 08/31/2017  . Pleuritic chest pain [R07.81] 08/31/2017  . Former smoker [H47.425] 08/31/2017  . Anxiety [F41.9]   . Depression [F32.9]   . Hypertension [I10]   . Hyperlipidemia [E78.5]   . Thyroid disease [E07.9]   . Other abnormal glucose [R73.09]   . Automatic implantable cardiac defibrillator in situ [Z95.810]   . Atrial fibrillation ([place]) [I48.91]   . Other left bundle branch block [I44.7]   . 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 Hypersomnia, unspecified [G47.10]   . Lump or mass in breast [N63.0]   . Unspecified vitamin D deficiency [E55.9]   . Thyroid cancer ([place]) [C73]     Total Time spent with patient: 30 minutes  Musculoskeletal: Strength & Muscle Tone: within normal limits Gait & Station: normal Patient leans: N/A  Psychiatric Specialty Exam: ROS no headache, no visual disturbances, no chest pain, no shortness of breath, no vomiting, no diarrhea, no melenas, no fever or chills, no bleeding, reports she does bruise easily  Blood pressure (!) 146/72, pulse 70, temperature 97.9 F (36.6 C), temperature source Oral, resp. rate 16, height 5\' 7"  (1.702 m), weight 92.1 kg, SpO2 97 %.Body mass index is 31.79 kg/m.  General Appearance: Well Groomed  Eye Contact::  Good  Speech:  Normal Rate409  Volume:  Normal  Mood:  improved mood, denies feeling depressed, presents euthymic today  Affect:  Appropriate and reactive  Thought Process:  Linear and Descriptions of Associations: Intact  Orientation:  Full  (Time, Place, and Person)  Thought Content:  no hallucinations, no delusions, not internally preoccupied   Suicidal Thoughts:  No denies suicidal ideations, denies self injurious ideations, denies homicidal or violent ideations  Homicidal Thoughts:  No  Memory:  recent and remote grossly intact   Judgement:  Other:  improved   Insight:  improved   Psychomotor Activity:  Normal  Concentration:  Good  Recall:  Good  Fund of Knowledge:Good  Language: Good  Akathisia:  Negative  Handed:  Right  AIMS (if indicated):     Assets:  Communication Skills Resilience Social Support  Sleep:  Number of Hours: 5.25  Cognition: WNL  ADL's:  Intact   Mental Status Per Nursing Assessment::   On Admission:  Suicidal ideation indicated by others  Demographic Factors:  62 year old , widowed, one adult daughter, plans to live with sister after discharge  Loss Factors: Family stressors, mainly stressful relationship with daughter, who has substance abuse issues, chronic medical ( cardiac) illnesses   Historical Factors: History of depression, no prior history of suicide attempts, no prior psychiatric admissions   Risk Reduction Factors:   Living with another person, especially a relative, Positive social support and Positive coping skills or problem solving skills  Continued Clinical Symptoms: At this time patient is alert, attentive, calm, well related, pleasant, reports improved mood and presents euthymic, with full range of affect, no thought disorder, no suicidal or self injurious ideations, no homicidal or violent ideations, future oriented. Denies medication side effects. Looking forward to discharge. Visible on unit, interacting appropriately with peers, pleasant on approach .  Cognitive Features That Contribute To Risk:  No gross cognitive deficits noted upon discharge. Is alert , attentive, and oriented x 3   Suicide Risk:  Mild:  Suicidal ideation of limited frequency, intensity,  duration, and specificity.  There are no identifiable plans, no associated intent, mild dysphoria and related symptoms, good self-control (both objective and subjective assessment), few other risk factors, and identifiable protective factors, including available and accessible social support.  Follow-up Information    [facility]. Go on 09/15/2017.   Why:  Appointment for medication management and therapy services is Tuesday, 09/15/17 at 8:40am. Please be sure to bring your Photo ID, SS card, any insurance information and any discharge paperwork.  Contact information: [address] [place] [address] [phone]        [facility] Follow up.   Specialty:  Cardiology Why:  Appointment needed. Waiting on consult from MD. Contact information: [place] [phone]          Plan Of Care/Follow-up recommendations:  Activity:  as tolerated  Diet:  heart healthy Tests:  NA Other:  See below  Patient is leaving unit in good spirits  Plans to go live with her sister, [name], who is very supportive Plans to follow up as above- states she plans to transfer outpatient cardiology care to [place], [place], as above, but in the meantime will continue to follow up with her established cardiologist in [place] , [place]. She has an established PCP, whom she plans to see soon.  Jenne Campus, MD 09/10/2017, 8:31 AM

## 2017-09-10 NOTE — Progress Notes (Signed)
Pt reports she is doing well and had a good day.  She denies SI/HI/AVH.  She voiced no needs or concerns this evening.  She has been pleasant and cooperative.  She has been observed sitting in the dayroom talking with peers and watching TV.  Support and encouragement offered.  Discharge plans are in process.  Pt plans to return home.  Safety maintained with q15 minute checks.

## 2017-09-10 NOTE — Progress Notes (Signed)
Patient self inventory- Patient slept fair last night, sleep medication was requested and it was helpful. Appetite has been good, energy level normal, concentration good. Depression, hopelessness, and anxiety are rated 2, 0, 2. Patient is complaining of soreness. Denies SI HI AVH. Patient's goal is "leaving" and pt stated "I realize mental illness is real." Support and encouragement given.  Patient is compliant with medications prescribed per provider.  Safety is maintained with 15 minute checks as well as environmental checks. Will continue to monitor.

## 2017-09-14 ENCOUNTER — Other Ambulatory Visit: Payer: Self-pay

## 2017-09-14 NOTE — Patient Outreach (Signed)
[facility]) Care Management  09/14/2017  Ana Martin January 22, 1956 [id number]   Transition of care  Referral date: 09/14/17 Referral source: discharged from Behavioral health on 09/10/17 Insurance: Health team advantage Attempt #1  Telephone call to patient regarding transition of care referral. Unable to reach patient or leave voice message. . Message states the number you have dialed has been changed, disconnected or is no longer in use.  RNCM called patient's listed health care power of attorney, Ana Martin.  HIPAA verified for patient.  Ms. Ana Martin state she is patients sister and power of attorney.  Sister states patient is scheduled to see her new primary MD on 09/16/17.  Sister states she is concerned about patients INR/ PT because it was elevated prior to patient going in the hospital. Sister states she feels patient may need her coumadin level checked weekly for a while.  RNCM advised patient's sister  To inform patients primary MD, Dr. Delphina Cahill of concern regarding patients PT/ INR and request referral to coumadin clinic. Sister states she takes patient to her doctors appointments and she will discuss this with doctor.  Sister states patient is still a little weak.  States she is trying to help patient get slowly integrated into doing things and getting out. Sister states patients number was changed to help reduce some of her stress level. Sister gave RNCM patients new contact phone number.  RNCM contacted patient. HIPAA verified. Explained reason for call.  Patient confirms she will have a follow up appointment with her new primary MD, Dr. Delphina Cahill on Wednesday 09/16/17.  Patient states some of her medications were changed while she was in the hospital. RNCM advised patient that she will follow up with patient after she sees her primary to review her medication list. Patient verbally agreed.  RNCM provided contact name and number: [phone] or main office number  [phone] and 24 hour nurse advise line [phone].  RNCM verified patient aware of 911 services for urgent/ emergent needs.   PLAN: RNCM will follow up with patient within 4 business days.  RNCM will send patient [facility] care management brochure/ magnet.  Quinn Plowman RN,BSN,CCM [facility] Telephonic  [phone]

## 2017-09-16 DIAGNOSIS — Z6832 Body mass index (BMI) 32.0-32.9, adult: Secondary | ICD-10-CM | POA: Diagnosis not present

## 2017-09-16 DIAGNOSIS — E782 Mixed hyperlipidemia: Secondary | ICD-10-CM | POA: Diagnosis not present

## 2017-09-16 DIAGNOSIS — E039 Hypothyroidism, unspecified: Secondary | ICD-10-CM | POA: Diagnosis not present

## 2017-09-16 DIAGNOSIS — I10 Essential (primary) hypertension: Secondary | ICD-10-CM | POA: Diagnosis not present

## 2017-09-16 DIAGNOSIS — F322 Major depressive disorder, single episode, severe without psychotic features: Secondary | ICD-10-CM | POA: Diagnosis not present

## 2017-09-16 DIAGNOSIS — I4891 Unspecified atrial fibrillation: Secondary | ICD-10-CM | POA: Diagnosis not present

## 2017-09-16 DIAGNOSIS — Z954 Presence of other heart-valve replacement: Secondary | ICD-10-CM | POA: Diagnosis not present

## 2017-09-16 DIAGNOSIS — N183 Chronic kidney disease, stage 3 (moderate): Secondary | ICD-10-CM | POA: Diagnosis not present

## 2017-09-16 DIAGNOSIS — I5022 Chronic systolic (congestive) heart failure: Secondary | ICD-10-CM | POA: Diagnosis not present

## 2017-09-16 DIAGNOSIS — J449 Chronic obstructive pulmonary disease, unspecified: Secondary | ICD-10-CM | POA: Diagnosis not present

## 2017-09-17 ENCOUNTER — Other Ambulatory Visit: Payer: Self-pay

## 2017-09-17 NOTE — Patient Outreach (Signed)
[place] [facility]) Care Management  09/17/2017  Ana Martin 1955/04/04 [id number]  Transition of care  Referral date: 09/14/17 Referral source: discharged from Behavioral health on 09/10/17 Insurance: Health team advantage Attempt #1  Telephone call to patient regarding transition of care follow up. Unable to reach patient. HIPAA compliant voice message left with call back phone number.   PLAN: RNCM will attempt 2nd telephone call to patient within 4 business days. RNCM will send outreach letter.   Quinn Plowman RN,BSN,CCM [facility] Telephonic  [phone]

## 2017-09-21 ENCOUNTER — Other Ambulatory Visit: Payer: Self-pay

## 2017-09-21 NOTE — Patient Outreach (Signed)
[facility]) Care Management  09/21/2017  Ana Martin 05-21-1955 [id number]  Transition of care  Referral date: 09/14/17 Referral source: discharged from Behavioral health on 09/10/17 Insurance: Health team advantage Attempt #2  Telephone call to patient regarding transition of care follow up. Unable to reach patient. HIPAA compliant voice message left with call back phone number.   PLAN: RNCM will attempt 3rd  telephone call to patient within 4 business days.   Quinn Plowman RN,BSN,CCM [facility] Telephonic  [phone]

## 2017-09-24 ENCOUNTER — Other Ambulatory Visit: Payer: Self-pay

## 2017-09-24 NOTE — Patient Outreach (Signed)
[facility]) Care Management  09/24/2017  Ana Martin [birth year] [id number]   Transition of care  Referral date:09/14/17 Referral source:discharged from Behavioral health on 09/10/17 Insurance:Health team advantage Attempt #3  Telephone call to patient to complete transition of care assessment. .Unable to reach patient. HIPAA compliant voice message left with call back phone number.   PLAN: If not return call will proceed with closure  Quinn Plowman RN,BSN,CCM [facility] Telephonic  [phone]

## 2017-10-08 ENCOUNTER — Encounter: Payer: Self-pay | Admitting: Cardiology

## 2017-10-08 ENCOUNTER — Ambulatory Visit: Payer: PPO | Admitting: Cardiology

## 2017-10-08 ENCOUNTER — Encounter: Payer: Self-pay | Admitting: *Deleted

## 2017-10-08 VITALS — BP 111/72 | HR 70 | Ht 67.0 in | Wt 195.8 lb

## 2017-10-08 DIAGNOSIS — Z952 Presence of prosthetic heart valve: Secondary | ICD-10-CM

## 2017-10-08 DIAGNOSIS — Z95810 Presence of automatic (implantable) cardiac defibrillator: Secondary | ICD-10-CM | POA: Diagnosis not present

## 2017-10-08 DIAGNOSIS — I5022 Chronic systolic (congestive) heart failure: Secondary | ICD-10-CM

## 2017-10-08 DIAGNOSIS — E782 Mixed hyperlipidemia: Secondary | ICD-10-CM

## 2017-10-08 NOTE — Progress Notes (Signed)
Clinical Summary Ms. Frese is a 62 y.o.female previuosly followed by [facility] cardiology. Seen today as a new patient  1. Chronic systolic HF - 10/6787 [facility] echo LVEF 30-35% - has BiV ICD Biotronik. Device check 09/10/17 at [facility] wit hnormal function, though suggested some fluid overload. - no SOB or DOE. No recent edema - compliant with meds. Home weights stable around 195 lbs and stable. Avoiding NSAIDs, limiting sodium intake.      2. Atrial flutter - history of prior ablation - no recent symptoms.   3. PAF - notes indicate history of prior av node ablation, pacer dependent.  - has been on coumadin for anticoag and low dose amiodarone (09/2017 TSH 4.1), .   - no recent palpitations - no bleeding on coumadin. Has also been on plavix for several approx 20 years for unlcear etiology based on initial chart review - coumadin levels followed by pcp  4. 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  - on coumadin with INR checks followed by pcp    5. COPD - followed by pcp  6. Hyperlipidemia - compliant with statin  7. Depression - recent admission 09/2017  8. History of TIA - per report, unclear if this may have been when plavix was started.  SH: her sister is [name] who is also a patient of mine.  Past Medical History:  Diagnosis Date  . Anxiety   . Atrial fibrillation ([place])   . Automatic implantable cardiac defibrillator in situ    Lumax DR-T 09-20-2007 Dr.Akbary  . Depression   . Hyperlipidemia   . 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 Hypertension   . Lump or mass in breast    at the 7 o'clock position of right breast(non-tender)  . Other abnormal glucose   . Other left bundle Rolando Whitby block   . Other primary cardiomyopathies   . Thyroid cancer ([place])   . Thyroid disease   . Unspecified vitamin D deficiency   . Wolff-Parkinson-White syndrome      Allergies  Allergen Reactions  . Codeine Nausea  Only     Current Outpatient Medications  Medication Sig Dispense Refill  . albuterol (PROAIR HFA) 108 (90 Base) MCG/ACT inhaler Inhale 1 puff into the lungs every 4 (four) hours as needed.    . ALPRAZolam (XANAX) 1 MG tablet Take 0.5 tablets (0.5 mg total) by mouth at bedtime as needed for anxiety. (Patient not taking: Reported on 09/14/2017) 30 tablet 0  . amiodarone (PACERONE) 200 MG tablet Take 1 tablet (200 mg total) by mouth daily. 30 tablet 0  . atorvastatin (LIPITOR) 20 MG tablet Take 1 tablet (20 mg total) by mouth daily at 6 PM.    . budesonide-formoterol (SYMBICORT) 160-4.5 MCG/ACT inhaler Inhale 2 puffs into the lungs 2 (two) times daily.    . carvedilol (COREG) 12.5 MG tablet Take 1 tablet (12.5 mg total) by mouth daily with breakfast. 30 tablet 0  . clopidogrel (PLAVIX) 75 MG tablet Take 75 mg by mouth daily.     Marland Kitchen enoxaparin (LOVENOX) 100 MG/ML injection Inject 0.9 mLs (90 mg total) into the skin every 12 (twelve) hours. 0 Syringe   . furosemide (LASIX) 40 MG tablet Take 1 tablet (40 mg total) by mouth daily. 30 tablet 0  . guaiFENesin (MUCINEX) 600 MG 12 hr tablet Take 2 tablets (1,200 mg total) by mouth 2 (two) times daily. 10 tablet 0  .  hydrOXYzine (ATARAX/VISTARIL) 25 MG tablet Take 1 tablet (25 mg total) by mouth 3 (three) times daily as needed for anxiety. 30 tablet 0  . ipratropium-albuterol (DUONEB) 0.5-2.5 (3) MG/3ML SOLN Take 3 mLs by nebulization 3 (three) times daily. 360 mL   . levothyroxine (SYNTHROID, LEVOTHROID) 200 MCG tablet Take 1 tablet (200 mcg total) by mouth daily before breakfast. 30 tablet 0  . Melatonin 5 MG TABS Take 5-10 mg by mouth at bedtime.    . potassium chloride SA (K-DUR,KLOR-CON) 20 MEQ tablet Take 20 mEq by mouth daily as needed.     . venlafaxine XR (EFFEXOR-XR) 75 MG 24 hr capsule Take 1 capsule (75 mg total) by mouth daily with breakfast. 30 capsule 0  . warfarin (COUMADIN) 2 MG tablet Take 2 mg by mouth as directed. 1 tablet daily except  1/2 tablet on Sunday & Wednesday      No current facility-administered medications for this visit.      Past Surgical History:  Procedure Laterality Date  . ABDOMINAL HYSTERECTOMY    . CARDIAC VALVE REPLACEMENT     st jude silzone mitray mechanical [id number] ss# [id number]  . COLONOSCOPY     April 12, 2008  . PACEMAKER PLACEMENT       Allergies  Allergen Reactions  . Codeine Nausea Only      Family History  Problem Relation Age of Onset  . Lung cancer Father        expired 62  . Sarcoidosis Mother        espired 2013     Social History Ms. Bonet reports that she quit smoking about 7 years ago. Her smoking use included cigarettes. She has a 25.00 pack-year smoking history. She has never used smokeless tobacco. Ms. Onnen reports that she does not drink alcohol.   Review of Systems CONSTITUTIONAL: No weight loss, fever, chills, weakness or fatigue.  HEENT: Eyes: No visual loss, blurred vision, double vision or yellow sclerae.No hearing loss, sneezing, congestion, runny nose or sore throat.  SKIN: No rash or itching.  CARDIOVASCULAR: per hpi RESPIRATORY: No shortness of breath, cough or sputum.  GASTROINTESTINAL: No anorexia, nausea, vomiting or diarrhea. No abdominal pain or blood.  GENITOURINARY: No burning on urination, no polyuria NEUROLOGICAL: No headache, dizziness, syncope, paralysis, ataxia, numbness or tingling in the extremities. No change in bowel or bladder control.  MUSCULOSKELETAL: No muscle, back pain, joint pain or stiffness.  LYMPHATICS: No enlarged nodes. No history of splenectomy.  PSYCHIATRIC: No history of depression or anxiety.  ENDOCRINOLOGIC: No reports of sweating, cold or heat intolerance. No polyuria or polydipsia.  Marland Kitchen   Physical Examination Vitals:   10/08/17 0842 10/08/17 0851  BP: 103/64 111/72  Pulse: 71 70  SpO2: 95% 94%   Vitals:   10/08/17 0842  Weight: 195 lb 12.8 oz (88.8 kg)  Height: 5\' 7"  (1.702 m)    Gen:  resting comfortably, no acute distress HEENT: no scleral icterus, pupils equal round and reactive, no palptable cervical adenopathy,  CV: RRR, mechanical S1 Resp: Clear to auscultation bilaterally GI: abdomen is soft, non-tender, non-distended, normal bowel sounds, no hepatosplenomegaly MSK: extremities are warm, no edema.  Skin: warm, no rash Neuro:  no focal deficits Psych: appropriate affect   Diagnostic Studies  08/2015 echo Findings Mitral Valve Mechanical prosthetic valve in mitral position. Aortic Valve The aortic valve leaflets were not well visualized. Tricuspid Valve Tricuspid valve was not well visualized Pulmonic Valve The pulmonic valve was not well visualized Left Atrium  Normal left atrium. Left Ventricle Moderately dilated left ventricle. Moderate to severe global LV hypokinesis. Ejection fraction is visually estimated at 30-35% Right Atrium Mild Right atrial enlargement Right Ventricle Mildly dilated right ventricle. Pacer and/or defibrillator wires visualized in right ventricle. Pericardial Effusion No evidence of pericardial effusion.   Assessment and Plan  1. Chronic systolic HF - no recent symptoms, appears euvolemic - repeat echo. Likely consider changing to entresto, possibly low dose aldactone in the future pending echo results  2. Mechanical MVR - on coumadin, followed by pcp - repeat echo  3. PAF - history of av nodal ablation, pacer dependent. Has been on amio - establish in device clinic. Defer to EP continued use of amio, Im unclear if beneficial if she is pacer dependent  5. Hyperlipidemia - request pcp labs, continue statin.   I am unsure of the indication for plavix at this time, this is our first visit and she has extensive cardiac history. Will continue for now and review her records further.    F/u 3 months   Arnoldo Lenis, M.D.

## 2017-10-08 NOTE — Patient Instructions (Signed)
Your physician recommends that you schedule a follow-up appointment in: [place]  Your physician recommends that you continue on your current medications as directed. Please refer to the Current Medication list given to you today.  Your physician has requested that you have an echocardiogram. Echocardiography is a painless test that uses sound waves to create images of your heart. It provides your doctor with information about the size and shape of your heart and how well your heart's chambers and valves are working. This procedure takes approximately one hour. There are no restrictions for this procedure.  You have been referred to [place]   Thank you for choosing [facility]!!

## 2017-10-12 ENCOUNTER — Other Ambulatory Visit: Payer: Self-pay

## 2017-10-12 ENCOUNTER — Telehealth: Payer: Self-pay | Admitting: Cardiology

## 2017-10-12 ENCOUNTER — Encounter: Payer: Self-pay | Admitting: *Deleted

## 2017-10-12 DIAGNOSIS — E785 Hyperlipidemia, unspecified: Secondary | ICD-10-CM | POA: Diagnosis not present

## 2017-10-12 DIAGNOSIS — I5022 Chronic systolic (congestive) heart failure: Secondary | ICD-10-CM | POA: Diagnosis not present

## 2017-10-12 DIAGNOSIS — E039 Hypothyroidism, unspecified: Secondary | ICD-10-CM | POA: Diagnosis not present

## 2017-10-12 DIAGNOSIS — I349 Nonrheumatic mitral valve disorder, unspecified: Secondary | ICD-10-CM | POA: Diagnosis not present

## 2017-10-12 DIAGNOSIS — Z6832 Body mass index (BMI) 32.0-32.9, adult: Secondary | ICD-10-CM | POA: Diagnosis not present

## 2017-10-12 DIAGNOSIS — E782 Mixed hyperlipidemia: Secondary | ICD-10-CM | POA: Diagnosis not present

## 2017-10-12 DIAGNOSIS — N183 Chronic kidney disease, stage 3 (moderate): Secondary | ICD-10-CM | POA: Diagnosis not present

## 2017-10-12 DIAGNOSIS — J449 Chronic obstructive pulmonary disease, unspecified: Secondary | ICD-10-CM | POA: Diagnosis not present

## 2017-10-12 DIAGNOSIS — I482 Chronic atrial fibrillation: Secondary | ICD-10-CM | POA: Diagnosis not present

## 2017-10-12 DIAGNOSIS — F322 Major depressive disorder, single episode, severe without psychotic features: Secondary | ICD-10-CM | POA: Diagnosis not present

## 2017-10-12 DIAGNOSIS — I10 Essential (primary) hypertension: Secondary | ICD-10-CM | POA: Diagnosis not present

## 2017-10-12 DIAGNOSIS — Z954 Presence of other heart-valve replacement: Secondary | ICD-10-CM | POA: Diagnosis not present

## 2017-10-12 DIAGNOSIS — I4891 Unspecified atrial fibrillation: Secondary | ICD-10-CM | POA: Diagnosis not present

## 2017-10-12 NOTE — Patient Outreach (Signed)
[facility]) Care Management  10/12/2017  Ana Martin 24-Jan-1956 [id number]   Transition of care  Referral date:09/14/17 Referral source:discharged from Behavioral health on 09/10/17 Insurance:[employer] advantage  No response after 3 telephone calls and outreach letter attempt.  PLAN: RNCM will close patient due to being unable to reach.  RNCM will send closure notification to patient's primary MD   Quinn Plowman RN,BSN,CCM [facility] Telephonic  [phone]

## 2017-10-12 NOTE — Telephone Encounter (Signed)
Patient returning call.

## 2017-10-12 NOTE — Telephone Encounter (Signed)
Pt called to confirm that pcp was Dr Nevada Crane in [place] - updated pcp and will request labs/notes per Dr Harl Bowie

## 2017-10-13 ENCOUNTER — Encounter: Payer: Self-pay | Admitting: *Deleted

## 2017-10-19 DIAGNOSIS — J449 Chronic obstructive pulmonary disease, unspecified: Secondary | ICD-10-CM | POA: Diagnosis not present

## 2017-10-19 DIAGNOSIS — I4891 Unspecified atrial fibrillation: Secondary | ICD-10-CM | POA: Diagnosis not present

## 2017-10-19 DIAGNOSIS — Z23 Encounter for immunization: Secondary | ICD-10-CM | POA: Diagnosis not present

## 2017-10-19 DIAGNOSIS — F322 Major depressive disorder, single episode, severe without psychotic features: Secondary | ICD-10-CM | POA: Diagnosis not present

## 2017-10-19 DIAGNOSIS — Z954 Presence of other heart-valve replacement: Secondary | ICD-10-CM | POA: Diagnosis not present

## 2017-10-19 DIAGNOSIS — E039 Hypothyroidism, unspecified: Secondary | ICD-10-CM | POA: Diagnosis not present

## 2017-10-19 DIAGNOSIS — I10 Essential (primary) hypertension: Secondary | ICD-10-CM | POA: Diagnosis not present

## 2017-10-19 DIAGNOSIS — Z6832 Body mass index (BMI) 32.0-32.9, adult: Secondary | ICD-10-CM | POA: Diagnosis not present

## 2017-10-19 DIAGNOSIS — I5022 Chronic systolic (congestive) heart failure: Secondary | ICD-10-CM | POA: Diagnosis not present

## 2017-10-19 DIAGNOSIS — N183 Chronic kidney disease, stage 3 (moderate): Secondary | ICD-10-CM | POA: Diagnosis not present

## 2017-10-19 DIAGNOSIS — E782 Mixed hyperlipidemia: Secondary | ICD-10-CM | POA: Diagnosis not present

## 2017-10-20 ENCOUNTER — Ambulatory Visit (HOSPITAL_COMMUNITY)
Admission: RE | Admit: 2017-10-20 | Discharge: 2017-10-20 | Disposition: A | Discharge status: Home / Self Care | Payer: PPO | Source: Ambulatory Visit | Attending: Adult Health Nurse Practitioner | Admitting: Adult Health Nurse Practitioner

## 2017-10-20 ENCOUNTER — Other Ambulatory Visit (HOSPITAL_COMMUNITY): Payer: Self-pay | Admitting: Adult Health Nurse Practitioner

## 2017-10-20 DIAGNOSIS — M25561 Pain in right knee: Secondary | ICD-10-CM | POA: Diagnosis present

## 2017-10-20 DIAGNOSIS — M7989 Other specified soft tissue disorders: Secondary | ICD-10-CM | POA: Diagnosis not present

## 2017-10-20 DIAGNOSIS — M1711 Unilateral primary osteoarthritis, right knee: Secondary | ICD-10-CM | POA: Insufficient documentation

## 2017-10-20 DIAGNOSIS — M25461 Effusion, right knee: Secondary | ICD-10-CM | POA: Insufficient documentation

## 2017-10-20 DIAGNOSIS — S8001XA Contusion of right knee, initial encounter: Secondary | ICD-10-CM | POA: Diagnosis not present

## 2017-10-22 ENCOUNTER — Other Ambulatory Visit: Payer: Self-pay

## 2017-10-30 ENCOUNTER — Encounter: Payer: Self-pay | Admitting: Internal Medicine

## 2017-10-30 DIAGNOSIS — R0989 Other specified symptoms and signs involving the circulatory and respiratory systems: Secondary | ICD-10-CM

## 2017-11-02 ENCOUNTER — Encounter: Payer: Self-pay | Admitting: Internal Medicine

## 2017-11-02 DIAGNOSIS — L03116 Cellulitis of left lower limb: Secondary | ICD-10-CM | POA: Diagnosis not present

## 2017-11-02 DIAGNOSIS — M7052 Other bursitis of knee, left knee: Secondary | ICD-10-CM | POA: Diagnosis not present

## 2017-11-02 DIAGNOSIS — Z6833 Body mass index (BMI) 33.0-33.9, adult: Secondary | ICD-10-CM | POA: Diagnosis not present

## 2017-11-02 DIAGNOSIS — M25562 Pain in left knee: Secondary | ICD-10-CM | POA: Diagnosis not present

## 2017-11-09 DIAGNOSIS — M7051 Other bursitis of knee, right knee: Secondary | ICD-10-CM | POA: Diagnosis not present

## 2017-11-09 DIAGNOSIS — L03115 Cellulitis of right lower limb: Secondary | ICD-10-CM | POA: Diagnosis not present

## 2017-11-09 DIAGNOSIS — Z6833 Body mass index (BMI) 33.0-33.9, adult: Secondary | ICD-10-CM | POA: Diagnosis not present

## 2017-11-10 ENCOUNTER — Encounter: Payer: Self-pay | Admitting: Orthopaedic Surgery

## 2017-11-10 ENCOUNTER — Ambulatory Visit: Payer: PPO | Admitting: Orthopaedic Surgery

## 2017-11-10 VITALS — BP 117/62 | HR 70 | Ht 67.0 in | Wt 200.0 lb

## 2017-11-10 DIAGNOSIS — Z7901 Long term (current) use of anticoagulants: Secondary | ICD-10-CM

## 2017-11-10 DIAGNOSIS — M25561 Pain in right knee: Secondary | ICD-10-CM

## 2017-11-10 DIAGNOSIS — L03115 Cellulitis of right lower limb: Secondary | ICD-10-CM

## 2017-11-10 MED ORDER — TRAMADOL HCL 50 MG PO TABS: 50.0000 mg | ORAL_TABLET | Freq: Four times a day (QID) | ORAL | 0 refills | Status: AC | PRN

## 2017-11-10 NOTE — Progress Notes (Signed)
Subjective:    Patient ID: Ana Martin, female    DOB: 1955/09/20, 62 y.o.   MRN: [id number]  HPI She fell and hurt her right knee on 10-16-17.  She is on Coumadin.  She had significant swelling of the right knee.  She went to see Dr. Wende Neighbors on 10-20-17.  X-rays were done of the right knee.  It showed: IMPRESSION: 1. No fracture or dislocation. 2. Lower prepatellar soft tissue swelling may represent post traumatic bursitis/hematoma. 3. Mild patellofemoral joint degenerative changes. Minimal medial tibiofemoral joint space narrowing. 4. Tiny suprapatellar joint effusion.  She developed redness and a cellulitis. She was begun on Keflex oral. Her swelling has decreased.  She has swelling still but much less.  The redness has decreased.  Dr. Nevada Crane wanted me to evaluate the knee now.  She has no new trauma.    She is on coumadin for her heart problems.    Review of Systems  Constitutional: Positive for activity change.  Respiratory: Positive for cough and shortness of breath.   Cardiovascular: Positive for palpitations and leg swelling.  Musculoskeletal: Positive for arthralgias, gait problem and joint swelling.  Psychiatric/Behavioral: The patient is nervous/anxious.   All other systems reviewed and are negative.  For Review of Systems, all other systems reviewed and are negative.  The following is a summary of the past history medically, past history surgically, known current medicines, social history and family history.  This information is gathered electronically by the computer from prior information and documentation.  I review this each visit and have found including this information at this point in the chart is beneficial and informative.   Past Medical History:  Diagnosis Date  . Anxiety   . Atrial fibrillation ([place])   . Automatic implantable cardiac defibrillator in situ    Lumax DR-T 09-20-2007 Dr.Akbary  . Depression   . Hyperlipidemia   . Hypersomnia,  unspecified    related to known Axis III factor  . Hypertension   . Lump or mass in breast    at the 7 o'clock position of right breast(non-tender)  . Other abnormal glucose   . Other left bundle branch block   . Other primary cardiomyopathies   . Thyroid cancer ([place])   . Thyroid disease   . Unspecified vitamin D deficiency   . Wolff-Parkinson-White syndrome     Past Surgical History:  Procedure Laterality Date  . ABDOMINAL HYSTERECTOMY    . CARDIAC VALVE REPLACEMENT     st jude silzone mitray mechanical [id number] ss# [id number]  . COLONOSCOPY     April 12, 2008  . PACEMAKER PLACEMENT      Current Outpatient Medications on File Prior to Visit  Medication Sig Dispense Refill  . albuterol (PROAIR HFA) 108 (90 Base) MCG/ACT inhaler Inhale 1 puff into the lungs every 4 (four) hours as needed.    . ALPRAZolam (XANAX) 0.5 MG tablet Take 0.5 mg by mouth daily as needed. for anxiety  0  . amiodarone (PACERONE) 200 MG tablet Take 1 tablet (200 mg total) by mouth daily. 30 tablet 0  . atorvastatin (LIPITOR) 40 MG tablet Take 40 mg by mouth every evening. for cholesterol  0  . budesonide-formoterol (SYMBICORT) 160-4.5 MCG/ACT inhaler Inhale 2 puffs into the lungs 2 (two) times daily.    . carvedilol (COREG) 12.5 MG tablet Take 12.5 mg by mouth 2 (two) times daily with a meal.    . clopidogrel (PLAVIX) 75 MG tablet Take 75 mg by  mouth daily.     . furosemide (LASIX) 40 MG tablet Take 1 tablet (40 mg total) by mouth daily. 30 tablet 0  . guaiFENesin (MUCINEX) 600 MG 12 hr tablet Take 2 tablets (1,200 mg total) by mouth 2 (two) times daily. (Patient taking differently: Take 1,200 mg by mouth 2 (two) times daily as needed. ) 10 tablet 0  . levothyroxine (SYNTHROID, LEVOTHROID) 200 MCG tablet Take 1 tablet (200 mcg total) by mouth daily before breakfast. 30 tablet 0  . losartan (COZAAR) 25 MG tablet Take 1 tablet by mouth daily.  4  . Melatonin 5 MG TABS Take 5-10 mg by mouth at bedtime.    .  potassium chloride SA (K-DUR,KLOR-CON) 20 MEQ tablet Take 20 mEq by mouth daily as needed.     . venlafaxine XR (EFFEXOR-XR) 75 MG 24 hr capsule Take 1 capsule (75 mg total) by mouth daily with breakfast. 30 capsule 0  . warfarin (COUMADIN) 2.5 MG tablet Take 2.5 mg by mouth as directed.     No current facility-administered medications on file prior to visit.     Social History   Socioeconomic History  . Marital status: Legally Separated    Spouse name: Not on file  . Number of children: Not on file  . Years of education: Not on file  . Highest education level: Not on file  Occupational History  . Not on file  Social Needs  . Financial resource strain: Not on file  . Food insecurity:    Worry: Not on file    Inability: Not on file  . Transportation needs:    Medical: Not on file    Non-medical: Not on file  Tobacco Use  . Smoking status: Former Smoker    Packs/day: 1.00    Years: 25.00    Pack years: 25.00    Types: Cigarettes    Last attempt to quit: 10/17/2009    Years since quitting: 8.0  . Smokeless tobacco: Never Used  Substance and Sexual Activity  . Alcohol use: No  . Drug use: No  . Sexual activity: Not Currently  Lifestyle  . Physical activity:    Days per week: Not on file    Minutes per session: Not on file  . Stress: Not on file  Relationships  . Social connections:    Talks on phone: Not on file    Gets together: Not on file    Attends religious service: Not on file    Active member of club or organization: Not on file    Attends meetings of clubs or organizations: Not on file    Relationship status: Not on file  . Intimate partner violence:    Fear of current or ex partner: Not on file    Emotionally abused: Not on file    Physically abused: Not on file    Forced sexual activity: Not on file  Other Topics Concern  . Not on file  Social History Narrative  . Not on file    Family History  Problem Relation Age of Onset  . Lung cancer Father          expired 62  . Sarcoidosis Mother        espired 2013  . COPD Mother     BP 117/62   Pulse 70   Ht 5\' 7"  (1.702 m)   Wt 200 lb (90.7 kg)   BMI 31.32 kg/m   Body mass index is 31.32 kg/m.  Objective:   Physical Exam  Constitutional: She is oriented to person, place, and time. She appears well-developed and well-nourished.  HENT:  Head: Normocephalic and atraumatic.  Eyes: Pupils are equal, round, and reactive to light. Conjunctivae and EOM are normal.  Neck: Normal range of motion. Neck supple.  Cardiovascular: Normal rate, regular rhythm and intact distal pulses.  Pulmonary/Chest: Effort normal.  Abdominal: Soft.  Musculoskeletal:       Right knee: She exhibits decreased range of motion and swelling.       Legs: Neurological: She is alert and oriented to person, place, and time. She has normal reflexes. She displays normal reflexes. No cranial nerve deficit. She exhibits normal muscle tone. Coordination normal.  Skin: Skin is warm and dry.  Psychiatric: She has a normal mood and affect. Her behavior is normal. Judgment and thought content normal.    I have reviewed the x-rays and report.  I have reviewed Dr. Juel Burrow notes and pictures of the knee.      Assessment & Plan:   Encounter Diagnoses  Name Primary?  . Cellulitis of right knee Yes  . Anticoagulated on Coumadin   . Acute pain of right knee    I have told her the hematoma is slowly resolving.  I will not aspirate it at this time.  I will change from the Keflex to doxycyline.  I have given samples of doxycyline 100 po bid to take for the next week.  Use mild heat to the knee.  Do not pick at the scabs.  Return in one week. I have given Toradol for pain. I have reviewed the [facility] web site prior to prescribing narcotic medicine for this patient.  Call if any problem.  Precautions discussed.   Electronically Signed Sanjuana Kava, MD 10/8/20192:59  PM

## 2017-11-11 ENCOUNTER — Other Ambulatory Visit: Payer: Self-pay

## 2017-11-17 ENCOUNTER — Encounter: Payer: Self-pay | Admitting: Orthopaedic Surgery

## 2017-11-17 ENCOUNTER — Ambulatory Visit: Payer: PPO | Admitting: Orthopaedic Surgery

## 2017-11-17 VITALS — BP 164/73 | HR 69 | Ht 67.0 in | Wt 199.0 lb

## 2017-11-17 DIAGNOSIS — M25561 Pain in right knee: Secondary | ICD-10-CM

## 2017-11-17 DIAGNOSIS — L03115 Cellulitis of right lower limb: Secondary | ICD-10-CM

## 2017-11-17 NOTE — Progress Notes (Signed)
Patient Ana Martin, female DOB:08/27/1955, 62 y.o. MEQ:[id number]  Chief Complaint  Patient presents with  . Knee Pain    right     HPI  Ana Martin is a 62 y.o. female who has resolving cellulitis of the right knee anteriorly over the patella area.  I had given her samples of doxycycline and she has been taking them.  She has no redness now, much less swelling, no pain and normal gait.  She has no new trauma.   Body mass index is 31.17 kg/m.  ROS  Review of Systems  Constitutional: Positive for activity change.  Respiratory: Positive for cough and shortness of breath.   Cardiovascular: Positive for palpitations and leg swelling.  Musculoskeletal: Positive for arthralgias, gait problem and joint swelling.  Psychiatric/Behavioral: The patient is nervous/anxious.   All other systems reviewed and are negative.   All other systems reviewed and are negative.  The following is a summary of the past history medically, past history surgically, known current medicines, social history and family history.  This information is gathered electronically by the computer from prior information and documentation.  I review this each visit and have found including this information at this point in the chart is beneficial and informative.    Past Medical History:  Diagnosis Date  . Anxiety   . Atrial fibrillation ([place])   . Automatic implantable cardiac defibrillator in situ    Lumax DR-T 09-20-2007 Dr.Akbary  . Depression   . Hyperlipidemia   . 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 Hypertension   . Lump or mass in breast    at the 7 o'clock position of right breast(non-tender)  . Other abnormal glucose   . Other left bundle branch block   . Other primary cardiomyopathies   . Thyroid cancer ([place])   . Thyroid disease   . Unspecified vitamin D deficiency   . Wolff-Parkinson-White syndrome     Past Surgical History:  Procedure Laterality Date   . ABDOMINAL HYSTERECTOMY    . CARDIAC VALVE REPLACEMENT     st jude silzone mitray mechanical [id number] ss# [id number]  . COLONOSCOPY     April 12, 2008  . PACEMAKER PLACEMENT      Family History  Problem Relation Age of Onset  . Lung cancer Father        expired 88  . Sarcoidosis Mother        espired 2013  . COPD Mother     Social History Social History   Tobacco Use  . Smoking status: Former Smoker    Packs/day: 1.00    Years: 25.00    Pack years: 25.00    Types: Cigarettes    Last attempt to quit: 10/17/2009    Years since quitting: 8.0  . Smokeless tobacco: Never Used  Substance Use Topics  . Alcohol use: No  . Drug use: No    Allergies  Allergen Reactions  . Codeine Nausea Only    Current Outpatient Medications  Medication Sig Dispense Refill  . albuterol (PROAIR HFA) 108 (90 Base) MCG/ACT inhaler Inhale 1 puff into the lungs every 4 (four) hours as needed.    . ALPRAZolam (XANAX) 0.5 MG tablet Take 0.5 mg by mouth daily as needed. for anxiety  0  . amiodarone (PACERONE) 200 MG tablet Take 1 tablet (200 mg total) by mouth daily. 30 tablet 0  . atorvastatin (LIPITOR) 40 MG tablet Take 40 mg by mouth every evening.  for cholesterol  0  . budesonide-formoterol (SYMBICORT) 160-4.5 MCG/ACT inhaler Inhale 2 puffs into the lungs 2 (two) times daily.    . carvedilol (COREG) 12.5 MG tablet Take 12.5 mg by mouth 2 (two) times daily with a meal.    . clopidogrel (PLAVIX) 75 MG tablet Take 75 mg by mouth daily.     . furosemide (LASIX) 40 MG tablet Take 1 tablet (40 mg total) by mouth daily. 30 tablet 0  . guaiFENesin (MUCINEX) 600 MG 12 hr tablet Take 2 tablets (1,200 mg total) by mouth 2 (two) times daily. (Patient taking differently: Take 1,200 mg by mouth 2 (two) times daily as needed. ) 10 tablet 0  . levothyroxine (SYNTHROID, LEVOTHROID) 200 MCG tablet Take 1 tablet (200 mcg total) by mouth daily before breakfast. 30 tablet 0  . losartan (COZAAR) 25 MG tablet Take 1  tablet by mouth daily.  4  . Melatonin 5 MG TABS Take 5-10 mg by mouth at bedtime.    . potassium chloride SA (K-DUR,KLOR-CON) 20 MEQ tablet Take 20 mEq by mouth daily as needed.     . venlafaxine XR (EFFEXOR-XR) 75 MG 24 hr capsule Take 1 capsule (75 mg total) by mouth daily with breakfast. 30 capsule 0  . warfarin (COUMADIN) 2.5 MG tablet Take 2.5 mg by mouth as directed.     No current facility-administered medications for this visit.      Physical Exam  Blood pressure (!) 164/73, pulse 69, height 5\' 7"  (1.702 m), weight 199 lb (90.3 kg).  Constitutional: overall normal hygiene, normal nutrition, well developed, normal grooming, normal body habitus. Assistive device:none  Musculoskeletal: gait and station Limp none, muscle tone and strength are normal, no tremors or atrophy is present.  .  Neurological: coordination overall normal.  Deep tendon reflex/nerve stretch intact.  Sensation normal.  Cranial nerves II-XII intact.   Skin:   Normal overall no scars, lesions, ulcers or rashes. No psoriasis.  Psychiatric: Alert and oriented x 3.  Recent memory intact, remote memory unclear.  Normal mood and affect. Well groomed.  Good eye contact.  Cardiovascular: overall no swelling, no varicosities, no edema bilaterally, normal temperatures of the legs and arms, no clubbing, cyanosis and good capillary refill.  Lymphatic: palpation is normal.  The right knee has no redness now, ROM is full, she has no pain.  She has some residual swelling of the anterior knee on the right.  She has slight dorsal swelling of the right foot.  Gait is normal  She is afebrile. All other systems reviewed and are negative   The patient has been educated about the nature of the problem(s) and counseled on treatment options.  The patient appeared to understand what I have discussed and is in agreement with it.  Encounter Diagnoses  Name Primary?  . Cellulitis of right knee Yes  . Acute pain of right knee      PLAN Call if any problems.  Precautions discussed.  Continue current medications.   Return to clinic prn per her request   Electronically Signed Sanjuana Kava, MD 10/15/20192:03 PM

## 2017-11-23 ENCOUNTER — Emergency Department (HOSPITAL_COMMUNITY): Payer: PPO

## 2017-11-23 ENCOUNTER — Encounter (HOSPITAL_COMMUNITY): Payer: Self-pay | Admitting: *Deleted

## 2017-11-23 ENCOUNTER — Other Ambulatory Visit: Payer: Self-pay

## 2017-11-23 ENCOUNTER — Emergency Department (HOSPITAL_COMMUNITY)
Admission: EM | Admit: 2017-11-23 | Discharge: 2017-11-23 | Disposition: A | Discharge status: Home / Self Care | Payer: PPO | Attending: Emergency Medicine | Admitting: Emergency Medicine

## 2017-11-23 DIAGNOSIS — Z950 Presence of cardiac pacemaker: Secondary | ICD-10-CM | POA: Diagnosis not present

## 2017-11-23 DIAGNOSIS — Z7902 Long term (current) use of antithrombotics/antiplatelets: Secondary | ICD-10-CM | POA: Diagnosis not present

## 2017-11-23 DIAGNOSIS — I110 Hypertensive heart disease with heart failure: Secondary | ICD-10-CM | POA: Diagnosis not present

## 2017-11-23 DIAGNOSIS — Z79899 Other long term (current) drug therapy: Secondary | ICD-10-CM | POA: Insufficient documentation

## 2017-11-23 DIAGNOSIS — Z7901 Long term (current) use of anticoagulants: Secondary | ICD-10-CM | POA: Insufficient documentation

## 2017-11-23 DIAGNOSIS — R0902 Hypoxemia: Secondary | ICD-10-CM | POA: Diagnosis not present

## 2017-11-23 DIAGNOSIS — F329 Major depressive disorder, single episode, unspecified: Secondary | ICD-10-CM | POA: Diagnosis not present

## 2017-11-23 DIAGNOSIS — R0602 Shortness of breath: Secondary | ICD-10-CM | POA: Diagnosis not present

## 2017-11-23 DIAGNOSIS — I10 Essential (primary) hypertension: Secondary | ICD-10-CM | POA: Diagnosis not present

## 2017-11-23 DIAGNOSIS — I5020 Unspecified systolic (congestive) heart failure: Secondary | ICD-10-CM | POA: Diagnosis not present

## 2017-11-23 DIAGNOSIS — Z87891 Personal history of nicotine dependence: Secondary | ICD-10-CM | POA: Insufficient documentation

## 2017-11-23 DIAGNOSIS — J449 Chronic obstructive pulmonary disease, unspecified: Secondary | ICD-10-CM | POA: Diagnosis not present

## 2017-11-23 LAB — BASIC METABOLIC PANEL
ANION GAP: 10 (ref 5–15)
BUN: 15 mg/dL (ref 8–23)
CO2: 24 mmol/L (ref 22–32)
CREATININE: 0.75 mg/dL (ref 0.44–1.00)
Calcium: 9.1 mg/dL (ref 8.9–10.3)
Chloride: 104 mmol/L (ref 98–111)
GFR calc Af Amer: >60 mL/min (ref >60)
GFR calc non Af Amer: >60 mL/min (ref >60)
Glucose, Bld: 135 mg/dL — ABNORMAL HIGH (ref 70–99)
POTASSIUM: 4.1 mmol/L (ref 3.5–5.1)
SODIUM: 138 mmol/L (ref 135–145)

## 2017-11-23 LAB — PROTIME-INR
INR: 1.7
PROTHROMBIN TIME: 19.7 s — AB (ref 11.4–15.2)

## 2017-11-23 LAB — HEPATIC FUNCTION PANEL
ALT: 17 U/L (ref 0–44)
AST: 26 U/L (ref 15–41)
Albumin: 4.1 g/dL (ref 3.5–5.0)
Alkaline Phosphatase: 45 U/L (ref 38–126)
BILIRUBIN DIRECT: 0.2 mg/dL (ref 0.0–0.2)
BILIRUBIN INDIRECT: 0.8 mg/dL (ref 0.3–0.9)
TOTAL PROTEIN: 7.8 g/dL (ref 6.5–8.1)
Total Bilirubin: 1 mg/dL (ref 0.3–1.2)

## 2017-11-23 LAB — DIFFERENTIAL
BASOS ABS: 0.1 10*3/uL (ref 0.0–0.1)
BASOS PCT: 1 %
EOS ABS: 0.2 10*3/uL (ref 0.0–0.5)
Eosinophils Relative: 2 %
Lymphocytes Relative: 13 %
Lymphs Abs: 1.2 10*3/uL (ref 0.7–4.0)
Monocytes Absolute: 0.4 10*3/uL (ref 0.1–1.0)
Monocytes Relative: 5 %
Neutro Abs: 7.1 10*3/uL (ref 1.7–7.7)
Neutrophils Relative %: 80 %

## 2017-11-23 LAB — TROPONIN I: Troponin I: <0.03 ng/mL (ref <0.03)

## 2017-11-23 LAB — CBC
HEMATOCRIT: 40.7 % (ref 36.0–46.0)
HEMOGLOBIN: 11.7 g/dL — AB (ref 12.0–15.0)
MCH: 24.9 pg — ABNORMAL LOW (ref 26.0–34.0)
MCHC: 28.7 g/dL — ABNORMAL LOW (ref 30.0–36.0)
MCV: 86.8 fL (ref 80.0–100.0)
NRBC: 0 % (ref 0.0–0.2)
Platelets: 179 10*3/uL (ref 150–400)
RBC: 4.69 MIL/uL (ref 3.87–5.11)
RDW: 18.1 % — AB (ref 11.5–15.5)
WBC: 8.9 10*3/uL (ref 4.0–10.5)

## 2017-11-23 LAB — BRAIN NATRIURETIC PEPTIDE: B NATRIURETIC PEPTIDE 5: 205 pg/mL — AB (ref 0.0–100.0)

## 2017-11-23 LAB — LIPASE, BLOOD: Lipase: 43 U/L (ref 11–51)

## 2017-11-23 MED ORDER — FUROSEMIDE 10 MG/ML IJ SOLN
40.0000 mg | Freq: Once | INTRAMUSCULAR | Status: AC
  Administered 2017-11-23: 40 mg via INTRAVENOUS
  Filled 2017-11-23: qty 4

## 2017-11-23 MED ORDER — GI COCKTAIL ~~LOC~~
30.0000 mL | Freq: Once | ORAL | Status: AC
  Administered 2017-11-23: 30 mL via ORAL
  Filled 2017-11-23: qty 30

## 2017-11-23 MED ORDER — FAMOTIDINE IN NACL 20-0.9 MG/50ML-% IV SOLN
20.0000 mg | Freq: Once | INTRAVENOUS | Status: AC
  Administered 2017-11-23: 20 mg via INTRAVENOUS
  Filled 2017-11-23: qty 50

## 2017-11-23 MED ORDER — VENLAFAXINE HCL ER 75 MG PO CP24: 75.0000 mg | ORAL_CAPSULE | Freq: Every day | ORAL | 0 refills | Status: DC

## 2017-11-23 MED ORDER — WARFARIN SODIUM 2.5 MG PO TABS
2.5000 mg | ORAL_TABLET | Freq: Once | ORAL | Status: AC
  Administered 2017-11-23: 2.5 mg via ORAL
  Filled 2017-11-23: qty 1

## 2017-11-23 NOTE — ED Triage Notes (Signed)
Pt c/o sob, smothering feeling in chest area that has been Intermittent since Saturday along with a feeling of a fast heart rate,

## 2017-11-23 NOTE — Discharge Instructions (Addendum)
Increase your Lasix to take 80 mg a day.  Increase your potassium to take 40 mEq a day.  Increase your Coumadin so you are taking 5 mg on Monday and Thursday.  And you will take 2.5 mg the other days.  Go on October 24 to [facility] to get your echo.  Follow-up with your cardiologist in the next couple weeks.  They should call you with an appointment

## 2017-11-23 NOTE — ED Notes (Signed)
Paged pharmacy for Warfarin.

## 2017-11-23 NOTE — ED Provider Notes (Signed)
[facility] EMERGENCY DEPARTMENT Provider Note   CSN: [id number] Arrival date & time: 11/23/17  0549     History   Chief Complaint Chief Complaint  Patient presents with  . Shortness of Breath    HPI Ana Martin is a 62 y.o. female.  Patient complains of shortness of breath and chest discomfort for the last couple days.  The history is provided by the patient. No language interpreter was used.  Shortness of Breath  This is a new problem. The problem occurs continuously.The current episode started more than 2 days ago. The problem has not changed since onset.Associated symptoms include chest pain. Pertinent negatives include no fever, no headaches, no cough, no abdominal pain and no rash. It is unknown (Known) what precipitated the problem. She has tried nothing for the symptoms. The treatment provided no relief. She has had prior hospitalizations. She has had prior ED visits. Associated medical issues include COPD. Associated medical issues do not include asthma.    Past Medical History:  Diagnosis Date  . Anxiety   . Atrial fibrillation ([place])   . Automatic implantable cardiac defibrillator in situ    [place]umax DR-T 09-20-2007 Dr.Akbary  . Depression   . Hyperlipidemia   . 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 Hypertension   . Lump or mass in breast    at the 7 o'clock position of right breast(non-tender)  . Other abnormal glucose   . Other left bundle branch block   . Other primary cardiomyopathies   . Thyroid cancer ([place])   . Thyroid disease   . Unspecified vitamin D deficiency   . Wolff-Parkinson-White syndrome     Patient Active Problem List   Diagnosis Date Noted  . MDD (major depressive disorder), recurrent severe, without psychosis ([place]) 09/04/2017  . Elevated INR 08/31/2017  . Syncope 08/31/2017  . Anticoagulant overdosage 08/31/2017  . Wolff-Parkinson-White syndrome 08/31/2017  . COPD (chronic obstructive pulmonary disease)  ([place]) 08/31/2017  . Pleuritic chest pain 08/31/2017  . Former smoker 08/31/2017  . Anxiety   . Depression   . Hypertension   . Hyperlipidemia   . Thyroid disease   . Other abnormal glucose   . Automatic implantable cardiac defibrillator in situ   . Atrial fibrillation ([place])   . Other left bundle branch block   . Other primary cardiomyopathies   . Hypersomnia, unspecified   . Lump or mass in breast   . Unspecified vitamin D deficiency   . Thyroid cancer [facility])     Past Surgical History:  Procedure Laterality Date  . ABDOMINAL HYSTERECTOMY    . CARDIAC VALVE REPLACEMENT     st jude silzone mitray mechanical [id number] ss# [id number]  . COLONOSCOPY     April 12, 2008  . PACEMAKER PLACEMENT       OB History   None      Home Medications    Prior to Admission medications   Medication Sig Start Date End Date Taking? Authorizing Provider  albuterol (PROAIR HFA) 108 (90 Base) MCG/ACT inhaler Inhale 1 puff into the lungs every 4 (four) hours as needed.   Yes [provider]  ALPRAZolam Duanne Moron) 0.5 MG tablet Take 0.5 mg by mouth daily as needed. for anxiety 09/16/17  Yes [provider]  amiodarone (PACERONE) 200 MG tablet Take 1 tablet (200 mg total) by mouth daily. 09/11/17  Yes Mordecai Maes, NP  atorvastatin (LIPITOR) 40 MG tablet Take 40 mg by mouth every evening.  for cholesterol 09/30/17  Yes [provider]  budesonide-formoterol (SYMBICORT) 160-4.5 MCG/ACT inhaler Inhale 2 puffs into the lungs 2 (two) times daily. 04/17/17  Yes [provider]  carvedilol (COREG) 12.5 MG tablet Take 12.5 mg by mouth 2 (two) times daily with a meal.   Yes [provider]  clopidogrel (PLAVIX) 75 MG tablet Take 75 mg by mouth daily.    Yes [provider]  furosemide (LASIX) 40 MG tablet Take 1 tablet (40 mg total) by mouth daily. 09/11/17  Yes Mordecai Maes, NP  levothyroxine (SYNTHROID, LEVOTHROID) 200 MCG tablet Take 1 tablet (200 mcg total)  by mouth daily before breakfast. 09/11/17  Yes Mordecai Maes, NP  losartan (COZAAR) 25 MG tablet Take 1 tablet by mouth daily. 09/30/17  Yes [provider]  Melatonin 5 MG TABS Take 5-10 mg by mouth at bedtime.   Yes [provider]  potassium chloride SA (K-DUR,KLOR-CON) 20 MEQ tablet Take 20 mEq by mouth daily as needed.    Yes [provider]  warfarin (COUMADIN) 2.5 MG tablet Take 2.5 mg by mouth as directed.   Yes [provider]  guaiFENesin (MUCINEX) 600 MG 12 hr tablet Take 2 tablets (1,200 mg total) by mouth 2 (two) times daily. Patient not taking: Reported on 11/23/2017 09/10/17   Mordecai Maes, NP  venlafaxine XR (EFFEXOR XR) 75 MG 24 hr capsule Take 1 capsule (75 mg total) by mouth daily with breakfast. 11/23/17   Milton Ferguson, MD    Family History Family History  Problem Relation Age of Onset  . Lung cancer Father        expired 34  . Sarcoidosis Mother        espired 2013  . COPD Mother     Social History Social History   Tobacco Use  . Smoking status: Former Smoker    Packs/day: 1.00    Years: 25.00    Pack years: 25.00    Types: Cigarettes    Last attempt to quit: 10/17/2009    Years since quitting: 8.1  . Smokeless tobacco: Never Used  Substance Use Topics  . Alcohol use: No  . Drug use: No     Allergies   Codeine   Review of Systems Review of Systems  Constitutional: Negative for appetite change, fatigue and fever.  HENT: Negative for congestion, ear discharge and sinus pressure.   Eyes: Negative for discharge.  Respiratory: Positive for shortness of breath. Negative for cough.   Cardiovascular: Positive for chest pain.  Gastrointestinal: Negative for abdominal pain and diarrhea.  Genitourinary: Negative for frequency and hematuria.  Musculoskeletal: Negative for back pain.  Skin: Negative for rash.  Neurological: Negative for seizures and headaches.  Psychiatric/Behavioral: Negative for hallucinations.      Physical Exam Updated Vital Signs BP (!) 152/67   Pulse 72   Temp 97.9 F (36.6 C) (Oral)   Resp 14   Ht 5\' 7"  (1.702 m)   Wt 90.3 kg   SpO2 99%   BMI 31.18 kg/m   Physical Exam  Constitutional: She is oriented to person, place, and time. She appears well-developed.  HENT:  Head: Normocephalic.  Eyes: Conjunctivae and EOM are normal. No scleral icterus.  Neck: Neck supple. No thyromegaly present.  Cardiovascular: Normal rate and regular rhythm. Exam reveals no gallop and no friction rub.  No murmur heard. Pulmonary/Chest: No stridor. She has no wheezes. She has no rales. She exhibits no tenderness.  Abdominal: She exhibits no distension. There  is no tenderness. There is no rebound.  Musculoskeletal: Normal range of motion. She exhibits no edema.  Lymphadenopathy:    She has no cervical adenopathy.  Neurological: She is oriented to person, place, and time. She exhibits normal muscle tone. Coordination normal.  Skin: No rash noted. No erythema.  Psychiatric: She has a normal mood and affect. Her behavior is normal.     ED Treatments / Results  Labs (all labs ordered are listed, but only abnormal results are displayed) Labs Reviewed  BASIC METABOLIC PANEL - Abnormal; Notable for the following components:      Result Value   Glucose, Bld 135 (*)    All other components within normal limits  CBC - Abnormal; Notable for the following components:   Hemoglobin 11.7 (*)    MCH 24.9 (*)    MCHC 28.7 (*)    RDW 18.1 (*)    All other components within normal limits  BRAIN NATRIURETIC PEPTIDE - Abnormal; Notable for the following components:   B Natriuretic Peptide 205.0 (*)    All other components within normal limits  PROTIME-INR - Abnormal; Notable for the following components:   Prothrombin Time 19.7 (*)    All other components within normal limits  TROPONIN I  DIFFERENTIAL  HEPATIC FUNCTION PANEL  LIPASE, BLOOD  TROPONIN I    EKG EKG  Interpretation  Date/Time:  Monday November 23 2017 05:55:35 EDT Ventricular Rate:  70 PR Interval:    QRS Duration: 132 QT Interval:  493 QTC Calculation: 533 R Axis:   -111 Text Interpretation:  paced rhythm No significant change since last tracing 08 Sep 2017 Confirmed by Rolland Porter [phone]) on 11/23/2017 6:10:04 AM   Radiology Dg Chest 2 View  Result Date: 11/23/2017 CLINICAL DATA:  Shortness of breath EXAM: CHEST - 2 VIEW COMPARISON:  08/31/2017 FINDINGS: Unchanged position of left chest wall AICD leads. Moderate cardiomegaly. Remote median sternotomy for valve replacement. Mild pulmonary edema. No pleural effusion or pneumothorax. Unchanged L1 compression deformity. IMPRESSION: Moderate cardiomegaly and mild pulmonary edema. Electronically Signed   By: Ulyses Jarred M.D.   On: 11/23/2017 06:37    Procedures Procedures (including critical care time)  Medications Ordered in ED Medications  warfarin (COUMADIN) tablet 2.5 mg (has no administration in time range)  famotidine (PEPCID) IVPB 20 mg premix (0 mg Intravenous Stopped 11/23/17 0809)  gi cocktail (Maalox,Lidocaine,Donnatal) (30 mLs Oral Given 11/23/17 0724)  furosemide (LASIX) injection 40 mg (40 mg Intravenous Given 11/23/17 1035)     Initial Impression / Assessment and Plan / ED Course  I have reviewed the triage vital signs and the nursing notes.  Pertinent labs & imaging results that were available during my care of the patient were reviewed by me and considered in my medical decision making (see chart for details).     Patient in mild congestive heart failure.  Patient had 2 normal troponins.  She also has had low Coumadin level.  I spoke with cardiology and the patient is scheduled to get an echo in 3 days.  She will follow-up with her cardiologist in 1 to 2 weeks.  Cardiology recommends increasing her Lasix and potassium.  And also increasing her Coumadin.  Final Clinical Impressions(s) / ED Diagnoses   Final  diagnoses:  Systolic congestive heart failure, unspecified HF chronicity [facility])    ED Discharge Orders         Ordered    venlafaxine XR (EFFEXOR XR) 75 MG 24 hr capsule  Daily with  breakfast     11/23/17 1206           Milton Ferguson, MD 11/23/17 1211

## 2017-11-23 NOTE — ED Notes (Signed)
Pt made comment that she "couldn't believe nobody's nerves could get this bad". Pt states she has been crying all weekend because her daughter is on drugs and she is worried about her grandchildren.

## 2017-11-23 NOTE — ED Notes (Signed)
Lab in room drawing blood work  

## 2017-11-23 NOTE — ED Notes (Signed)
Pt ambulatory to restroom

## 2017-11-26 ENCOUNTER — Other Ambulatory Visit: Payer: Self-pay

## 2017-11-26 ENCOUNTER — Encounter: Payer: Self-pay | Admitting: Internal Medicine

## 2017-11-26 ENCOUNTER — Ambulatory Visit (INDEPENDENT_AMBULATORY_CARE_PROVIDER_SITE_OTHER): Payer: PPO

## 2017-11-26 DIAGNOSIS — I5022 Chronic systolic (congestive) heart failure: Secondary | ICD-10-CM | POA: Diagnosis not present

## 2017-11-27 ENCOUNTER — Telehealth: Payer: Self-pay | Admitting: Cardiology

## 2017-11-27 ENCOUNTER — Telehealth: Payer: Self-pay | Admitting: *Deleted

## 2017-11-27 DIAGNOSIS — Z5181 Encounter for therapeutic drug level monitoring: Secondary | ICD-10-CM | POA: Diagnosis not present

## 2017-11-27 DIAGNOSIS — I4891 Unspecified atrial fibrillation: Secondary | ICD-10-CM

## 2017-11-27 NOTE — Telephone Encounter (Signed)
Result note placed   Zandra Abts MD

## 2017-11-27 NOTE — Telephone Encounter (Signed)
Pre-cert Verification for the following procedure   Echo Limited with contrast scheduled for 12-02-17 @ [facility]

## 2017-11-27 NOTE — Telephone Encounter (Signed)
Please give pt a call w/ Echo results

## 2017-11-27 NOTE — Telephone Encounter (Signed)
Pt agreeable to limited echo - orders placed and sent to schedulers

## 2017-11-27 NOTE — Telephone Encounter (Signed)
-----   Message from Arnoldo Lenis, MD sent at 11/27/2017  3:53 PM EDT ----- Echo somewhat limited, based on available images appears her heart function is stable around 35 to 40% (normal is 50-60%). To be sure Id like to get a limited echo at [facility] with echocontrast to verify her heart function before making any medication chagnes   Ana Abts MD

## 2017-11-27 NOTE — Telephone Encounter (Signed)
Test just done yesterday - will forward to provider

## 2017-11-27 NOTE — Telephone Encounter (Signed)
See result note duplicated

## 2017-12-02 ENCOUNTER — Ambulatory Visit (HOSPITAL_COMMUNITY)
Admission: RE | Admit: 2017-12-02 | Discharge: 2017-12-02 | Disposition: A | Discharge status: Home / Self Care | Payer: PPO | Source: Ambulatory Visit | Attending: Cardiology | Admitting: Cardiology

## 2017-12-02 DIAGNOSIS — I10 Essential (primary) hypertension: Secondary | ICD-10-CM | POA: Diagnosis not present

## 2017-12-02 DIAGNOSIS — Z85850 Personal history of malignant neoplasm of thyroid: Secondary | ICD-10-CM | POA: Insufficient documentation

## 2017-12-02 DIAGNOSIS — I456 Pre-excitation syndrome: Secondary | ICD-10-CM | POA: Diagnosis not present

## 2017-12-02 DIAGNOSIS — Z87891 Personal history of nicotine dependence: Secondary | ICD-10-CM | POA: Diagnosis not present

## 2017-12-02 DIAGNOSIS — E785 Hyperlipidemia, unspecified: Secondary | ICD-10-CM | POA: Diagnosis not present

## 2017-12-02 DIAGNOSIS — I4891 Unspecified atrial fibrillation: Secondary | ICD-10-CM | POA: Diagnosis not present

## 2017-12-02 DIAGNOSIS — Z95810 Presence of automatic (implantable) cardiac defibrillator: Secondary | ICD-10-CM | POA: Diagnosis not present

## 2017-12-02 DIAGNOSIS — J449 Chronic obstructive pulmonary disease, unspecified: Secondary | ICD-10-CM | POA: Insufficient documentation

## 2017-12-02 MED ORDER — PERFLUTREN LIPID MICROSPHERE
1.0000 mL | INTRAVENOUS | Status: AC | PRN
  Administered 2017-12-02 (×2): 1 mL via INTRAVENOUS
  Administered 2017-12-02: 2 mL via INTRAVENOUS
  Filled 2017-12-02: qty 10

## 2017-12-02 NOTE — Progress Notes (Signed)
*  PRELIMINARY RESULTS* Echocardiogram 2D Echocardiogram has been performed.  Ana Martin 12/02/2017, 2:35 PM

## 2017-12-03 ENCOUNTER — Encounter: Payer: Self-pay | Admitting: *Deleted

## 2017-12-09 ENCOUNTER — Ambulatory Visit: Payer: PPO | Admitting: Student

## 2017-12-09 ENCOUNTER — Other Ambulatory Visit (HOSPITAL_COMMUNITY)
Admission: RE | Admit: 2017-12-09 | Discharge: 2017-12-09 | Disposition: A | Discharge status: Home / Self Care | Payer: PPO | Source: Ambulatory Visit | Attending: Student | Admitting: Student

## 2017-12-09 ENCOUNTER — Telehealth: Payer: Self-pay | Admitting: *Deleted

## 2017-12-09 ENCOUNTER — Ambulatory Visit (INDEPENDENT_AMBULATORY_CARE_PROVIDER_SITE_OTHER): Payer: PPO | Admitting: *Deleted

## 2017-12-09 ENCOUNTER — Encounter: Payer: Self-pay | Admitting: Student

## 2017-12-09 VITALS — BP 138/82 | HR 70 | Ht 67.0 in | Wt 203.0 lb

## 2017-12-09 DIAGNOSIS — Z95810 Presence of automatic (implantable) cardiac defibrillator: Secondary | ICD-10-CM | POA: Diagnosis not present

## 2017-12-09 DIAGNOSIS — Z79899 Other long term (current) drug therapy: Secondary | ICD-10-CM

## 2017-12-09 DIAGNOSIS — I10 Essential (primary) hypertension: Secondary | ICD-10-CM

## 2017-12-09 DIAGNOSIS — I480 Paroxysmal atrial fibrillation: Secondary | ICD-10-CM

## 2017-12-09 DIAGNOSIS — I4891 Unspecified atrial fibrillation: Secondary | ICD-10-CM | POA: Diagnosis not present

## 2017-12-09 DIAGNOSIS — I5042 Chronic combined systolic (congestive) and diastolic (congestive) heart failure: Secondary | ICD-10-CM | POA: Insufficient documentation

## 2017-12-09 DIAGNOSIS — Z952 Presence of prosthetic heart valve: Secondary | ICD-10-CM | POA: Diagnosis not present

## 2017-12-09 DIAGNOSIS — I428 Other cardiomyopathies: Secondary | ICD-10-CM

## 2017-12-09 DIAGNOSIS — Z5181 Encounter for therapeutic drug level monitoring: Secondary | ICD-10-CM | POA: Diagnosis not present

## 2017-12-09 DIAGNOSIS — E782 Mixed hyperlipidemia: Secondary | ICD-10-CM

## 2017-12-09 LAB — POCT INR: INR: 2.2 (ref 2.0–3.0)

## 2017-12-09 LAB — BASIC METABOLIC PANEL
Anion gap: 9 (ref 5–15)
BUN: 14 mg/dL (ref 8–23)
CALCIUM: 9.1 mg/dL (ref 8.9–10.3)
CO2: 29 mmol/L (ref 22–32)
CREATININE: 0.83 mg/dL (ref 0.44–1.00)
Chloride: 99 mmol/L (ref 98–111)
GFR calc non Af Amer: >60 mL/min (ref >60)
Glucose, Bld: 104 mg/dL — ABNORMAL HIGH (ref 70–99)
Potassium: 4.1 mmol/L (ref 3.5–5.1)
SODIUM: 137 mmol/L (ref 135–145)

## 2017-12-09 LAB — BRAIN NATRIURETIC PEPTIDE: B NATRIURETIC PEPTIDE 5: 92 pg/mL (ref 0.0–100.0)

## 2017-12-09 NOTE — Telephone Encounter (Signed)
Spoke with patient.  Appointment made to establish with coumadin clinic in [place].  Pt in agreement.

## 2017-12-09 NOTE — Progress Notes (Signed)
Cardiology Office Note    Date:  12/09/2017   ID:  Ana Martin, DOB 1955-12-16, MRN [id number]  PCP:  Celene Squibb, MD  Cardiologist: Carlyle Dolly, MD    Chief Complaint  Patient presents with  . Follow-up    recent Emergency Dept visit    History of Present Illness:    Ana Martin is a 62 y.o. female with past medical history of chronic combined systolic and diastolic CHF (EF 61-44% by echo in 08/2015, at 35-40% by repeat imaging in 10/2017, s/p Biotronik BiV ICD at [facility]), PAF (on Coumadin and Amiodarone), mechanical MVR (performed in 1999), HTN, HLD, COPD, and prior TIA who presents to the office today for hospital follow-up.   She was last examined by Dr. Harl Bowie in 10/2017 as she had previously been followed by [facility] and was switching to [facility]. She denied any recent chest pain or dyspnea on exertion at that time. Reported weight had been stable at 195 lbs on her home scales. It was recommended that she undergo a repeat echocardiogram and consider transitioning to [facility] or Spironolactone in the future if EF remained reduced.  Repeat echocardiogram was obtained on 11/26/2017 and showed that her EF was at 35 to 40% but visualization was limited in a limited study with contrast was recommended. Her mechanical valve was functioning normally with no significant stenosis or regurgitation. Limited echo was performed on 12/02/2017 and showed the EF was 35 to 40%.  In the interim since her last office visit, she was evaluated at [facility] ED on 11/23/2017 for palpitations and worsening dyspnea. Labs showed WBC 8.9, Hgb 11.7, platelets 179, Na+ 138, K+ 4.1, and creatinine 0.75. Initial and delta troponin values were negative. EKG showed AV-paced rhythm, HR 70. BNP was elevated to 205 and CXR showed cardiomegaly with mild pulmonary edema. She was given IV Lasix with improvement in her dyspnea and informed to follow-up with Cardiology.   In  talking with the patient today, she reports overall doing well since her recent ED visit and says that her respiratory status has been at baseline. She denies any recurrent dyspnea on exertion, orthopnea, PND, or lower extremity edema. She has been following weight on her home scales and says this did decline over the past few days but has trended up by 1 pound recently. Lasix was titrated from 40 mg daily to 80 mg daily by the Emergency Department physician but she self decreased this back to 40 mg daily earlier this week due to feeling like she was urinating too frequently.  She denies any recent chest pain, palpitations, lightheadedness, dizziness, or presyncope.   Past Medical History:  Diagnosis Date  . Anxiety   . Atrial fibrillation ([place])   . Automatic implantable cardiac defibrillator in situ    Lumax DR-T 09-20-2007 Dr.Akbary  . Depression   . Hyperlipidemia   . 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 Hypertension   . Lump or mass in breast    at the 7 o'clock position of right breast(non-tender)  . Other abnormal glucose   . Other left bundle branch block   . Other primary cardiomyopathies   . Thyroid cancer ([place])   . Thyroid disease   . Unspecified vitamin D deficiency   . Wolff-Parkinson-White syndrome     Past Surgical History:  Procedure Laterality Date  . ABDOMINAL HYSTERECTOMY    . CARDIAC VALVE REPLACEMENT     st  jude silzone mitray mechanical [id number] ss# [id number]  . COLONOSCOPY     April 12, 2008  . PACEMAKER PLACEMENT      Current Medications: Outpatient Medications Prior to Visit  Medication Sig Dispense Refill  . albuterol (PROAIR HFA) 108 (90 Base) MCG/ACT inhaler Inhale 1 puff into the lungs every 4 (four) hours as needed.    . ALPRAZolam (XANAX) 0.5 MG tablet Take 0.5 mg by mouth daily as needed. for anxiety  0  . amiodarone (PACERONE) 200 MG tablet Take 1 tablet (200 mg total) by mouth daily. 30 tablet 0  . atorvastatin  (LIPITOR) 40 MG tablet Take 40 mg by mouth every evening. for cholesterol  0  . budesonide-formoterol (SYMBICORT) 160-4.5 MCG/ACT inhaler Inhale 2 puffs into the lungs 2 (two) times daily.    . carvedilol (COREG) 12.5 MG tablet Take 12.5 mg by mouth 2 (two) times daily with a meal.    . clopidogrel (PLAVIX) 75 MG tablet Take 75 mg by mouth daily.     . furosemide (LASIX) 40 MG tablet Take 1 tablet (40 mg total) by mouth daily. 30 tablet 0  . guaiFENesin (MUCINEX) 600 MG 12 hr tablet Take 2 tablets (1,200 mg total) by mouth 2 (two) times daily. 10 tablet 0  . levothyroxine (SYNTHROID, LEVOTHROID) 200 MCG tablet Take 1 tablet (200 mcg total) by mouth daily before breakfast. 30 tablet 0  . losartan (COZAAR) 25 MG tablet Take 1 tablet by mouth daily.  4  . Melatonin 5 MG TABS Take 5-10 mg by mouth at bedtime.    . potassium chloride SA (K-DUR,KLOR-CON) 20 MEQ tablet Take 20 mEq by mouth daily as needed.     . venlafaxine XR (EFFEXOR XR) 75 MG 24 hr capsule Take 1 capsule (75 mg total) by mouth daily with breakfast. 30 capsule 0  . warfarin (COUMADIN) 2.5 MG tablet Take 2.5 mg by mouth as directed.     No facility-administered medications prior to visit.      Allergies:   Codeine   Social History   Socioeconomic History  . Marital status: Divorced    Spouse name: Not on file  . Number of children: Not on file  . Years of education: Not on file  . Highest education level: Not on file  Occupational History  . Not on file  Social Needs  . Financial resource strain: Not on file  . Food insecurity:    Worry: Not on file    Inability: Not on file  . Transportation needs:    Medical: Not on file    Non-medical: Not on file  Tobacco Use  . Smoking status: Former Smoker    Packs/day: 1.00    Years: 25.00    Pack years: 25.00    Types: Cigarettes    Last attempt to quit: 10/17/2009    Years since quitting: 8.1  . Smokeless tobacco: Never Used  Substance and Sexual Activity  . Alcohol  use: No  . Drug use: No  . Sexual activity: Not Currently  Lifestyle  . Physical activity:    Days per week: Not on file    Minutes per session: Not on file  . Stress: Not on file  Relationships  . Social connections:    Talks on phone: Not on file    Gets together: Not on file    Attends religious service: Not on file    Active member of club or organization: Not on file    Attends meetings  of clubs or organizations: Not on file    Relationship status: Not on file  Other Topics Concern  . Not on file  Social History Narrative  . Not on file     Family History:  The patient's family history includes COPD in her mother; Lung cancer in her father; Sarcoidosis in her mother.   Review of Systems:   Please see the history of present illness.     General:  No chills, fever, night sweats or weight changes.  Cardiovascular:  No chest pain, edema, orthopnea, palpitations, paroxysmal nocturnal dyspnea. Positive for dyspnea on exertion (now improved).  Dermatological: No rash, lesions/masses Respiratory: No cough, dyspnea Urologic: No hematuria, dysuria Abdominal:   No nausea, vomiting, diarrhea, bright red blood per rectum, melena, or hematemesis Neurologic:  No visual changes, wkns, changes in mental status. All other systems reviewed and are otherwise negative except as noted above.   Physical Exam:    VS:  BP 138/82   Pulse 70   Ht 5\' 7"  (1.702 m)   Wt 203 lb (92.1 kg)   SpO2 97%   BMI 31.79 kg/m    General: Well developed, well nourished Caucasian female appearing in no acute distress. Head: Normocephalic, atraumatic, sclera non-icteric, no xanthomas, nares are without discharge.  Neck: No carotid bruits. JVD not elevated.  Lungs: Respirations regular and unlabored, without wheezes or rales.  Heart: Regular rate and rhythm. No S3 or S4.  No murmur, no rubs, or gallops appreciated. Abdomen: Soft, non-tender, non-distended with normoactive bowel sounds. No hepatomegaly. No  rebound/guarding. No obvious abdominal masses. Msk:  Strength and tone appear normal for age. No joint deformities or effusions. Extremities: No clubbing or cyanosis. No lower extremity edema.  Distal pedal pulses are 2+ bilaterally. Neuro: Alert and oriented X 3. Moves all extremities spontaneously. No focal deficits noted. Psych:  Responds to questions appropriately with a normal affect. Skin: No rashes or lesions noted  Wt Readings from Last 3 Encounters:  12/09/17 203 lb (92.1 kg)  11/23/17 199 lb 1.2 oz (90.3 kg)  11/17/17 199 lb (90.3 kg)     Studies/Labs Reviewed:   EKG:  EKG is not ordered today.   Recent Labs: 09/05/2017: TSH 4.139 09/09/2017: Magnesium 2.1 11/23/2017: ALT 17; Hemoglobin 11.7; Platelets 179 12/09/2017: B Natriuretic Peptide 92.0; BUN 14; Creatinine, Ser 0.83; Potassium 4.1; Sodium 137   Lipid Panel No results found for: CHOL, TRIG, HDL, CHOLHDL, VLDL, LDLCALC, LDLDIRECT  Additional studies/ records that were reviewed today include:   Echocardiogram: 11/26/2017 Study Conclusions  - Left ventricle: The cavity size was normal. Wall thickness was   increased in a pattern of mild LVH. Systolic function was   moderately reduced. The estimated ejection fraction was in the   range of 35% to 40%. Unable to determine diastolic function in   setting of MVR. - Mitral valve: There is a mechanical valve unknown type or size in   the MV position. - Left atrium: The atrium was severely dilated. - Right ventricle: Poorly visualized. TAPSE would suggest mild   systolic dysfunction. TAPSE: 14.1 mm . - Technically difficult study.  Impressions:  - LVEF appears to be around 35-40%. Some limitation in the   visualization of the endocardium, consider limited study with   echocontrast  Limited Echo: 12/02/2017 Study Conclusions  - Limited study with echocontrast to evaluate LV function. - Left ventricle: Systolic function was moderately reduced. The   estimated  ejection fraction was in the range of 35% to  40%.   Diffuse hypokinesis.   Assessment:    1. Chronic combined systolic (congestive) and diastolic (congestive) heart failure ([place])   2. Nonischemic cardiomyopathy ([place])   3. Biventricular ICD (implantable cardioverter-defibrillator) in place   4. Paroxysmal atrial fibrillation (HCC)   5. S/P MVR (mitral valve replacement)   6. Essential hypertension   7. Mixed hyperlipidemia   8. Medication management      Plan:   In order of problems listed above:  1. Chronic Combined Systolic and Diastolic CHF - she has a known reduced EF of 30-35% by echo in 2017, at 35-40% by repeat imaging in 10/2017. Reports having multiple catheterizations in the past and showing no significant CAD. Unfortunately, I am unable to locate any of her prior catheterization reports in Care Everywhere.  - she was recently evaluated in the ED for worsening dyspnea and BNP was slightly elevated as outlined above. She increased Lasix from her baseline dosing of 40mg  daily to 80mg  daily as directed by the EDP but has now reduced this back to 40mg  daily. Respiratory status is now back to baseline and she appears euvolemic by examination today.  - will recheck BNP and BMET. If BNP remains elevated, can titrate Lasix to 60mg  daily, otherwise would continue at 40mg  daily. She is currently on Coreg 12.5mg  BID and Losartan 25mg  daily. If BMET overall stable, would plan to switch from Losartan to Entresto at her next visit given her persistently reduced EF.   2. Nonischemic Cardiomyopathy - s/p Biotronik BiV ICD placement at [facility]. Plans to establish care with Dr. Rayann Heman next week.  - continue BB and ARB with plans to transition to [facility] as outlined above.   3. Paroxysmal Atrial Fibrillation - s/p prior ablation and she has mostly been AV paced by review of recent EKG's. Will need device interrogation at her upcoming EP visit.  - remains on Coumadin for anticoagulation  and denies any evidence of active bleeding.  - continue Amiodarone 200mg  daily and Coreg 12.5mg  BID.  4. History of MVR - s/p mechanical MVR in 1999 with recent echocardiogram showing no significant stenosis or regurgitation.   5. HTN - BP is well-controlled at 138/82 during today's visit. - continue Coreg and Losartan at current dosing for now.   6. HLD - followed by PCP. She remains on Atorvastatin 40mg  daily.   7. History of TIA - has remained on Plavix 75mg  daily. Would consider discontinuing if this can be clarified with Neurology as she is also on Coumadin for anticoagulation.   Medication Adjustments/Labs and Tests Ordered: Current medicines are reviewed at length with the patient today.  Concerns regarding medicines are outlined above.  Medication changes, Labs and Tests ordered today are listed in the Patient Instructions below. Patient Instructions  Medication Instructions:  Your physician has recommended you make the following change in your medication:  Take Lasix 40 mg Daily   If you need a refill on your cardiac medications before your next appointment, please call your pharmacy.   Lab work: Your physician recommends that you return for lab work today.   If you have labs (blood work) drawn today and your tests are completely normal, you will receive your results only by: Marland Kitchen MyChart Message (if you have MyChart) OR . A paper copy in the mail If you have any lab test that is abnormal or we need to change your treatment, we will call you to review the results.  Testing/Procedures: NONE   Follow-Up: At [facility], you and your health needs are our priority.  As part of our continuing mission to provide you with exceptional heart care, we have created designated Provider Care Teams.  These Care Teams include your primary Cardiologist (physician) and Advanced Practice Providers (APPs -  Physician Assistants and Nurse Practitioners) who all work together to provide you  with the care you need, when you need it. You will need a follow up appointment in 1 months.  Please call our office 2 months in advance to schedule this appointment.  You may see No primary care provider on file. or one of the following Advanced Practice Providers on your designated Care Team:   [place], PA-C [facility]) . Ermalinda Barrios, PA-C ([place])  Any Other Special Instructions Will Be Listed Below (If Applicable). Thank you for choosing [facility]!     Signed, Erma Heritage, PA-C  12/09/2017 7:38 PM    [address] [place], [place] [address] Phone: [phone]

## 2017-12-09 NOTE — Patient Instructions (Signed)
Medication Instructions:  Your physician has recommended you make the following change in your medication:  Take Lasix 40 mg Daily   If you need a refill on your cardiac medications before your next appointment, please call your pharmacy.   Lab work: Your physician recommends that you return for lab work today.   If you have labs (blood work) drawn today and your tests are completely normal, you will receive your results only by: Marland Kitchen MyChart Message (if you have MyChart) OR . A paper copy in the mail If you have any lab test that is abnormal or we need to change your treatment, we will call you to review the results.  Testing/Procedures: NONE   Follow-Up: At [facility], you and your health needs are our priority.  As part of our continuing mission to provide you with exceptional heart care, we have created designated Provider Care Teams.  These Care Teams include your primary Cardiologist (physician) and Advanced Practice Providers (APPs -  Physician Assistants and Nurse Practitioners) who all work together to provide you with the care you need, when you need it. You will need a follow up appointment in 1 months.  Please call our office 2 months in advance to schedule this appointment.  You may see No primary care provider on file. or one of the following Advanced Practice Providers on your designated Care Team:   [place], PA-C [facility]) . Ermalinda Barrios, PA-C ([place])  Any Other Special Instructions Will Be Listed Below (If Applicable). Thank you for choosing [facility]!

## 2017-12-09 NOTE — Telephone Encounter (Signed)
Patient called wanting to transfer her coumdin to our coumdin clinic.  Please call [phone].

## 2017-12-10 DIAGNOSIS — I509 Heart failure, unspecified: Secondary | ICD-10-CM | POA: Diagnosis not present

## 2017-12-10 DIAGNOSIS — Z95810 Presence of automatic (implantable) cardiac defibrillator: Secondary | ICD-10-CM | POA: Diagnosis not present

## 2017-12-10 DIAGNOSIS — Z5181 Encounter for therapeutic drug level monitoring: Secondary | ICD-10-CM | POA: Insufficient documentation

## 2017-12-10 LAB — POCT INR: INR: 2.2 (ref 2.0–3.0)

## 2017-12-10 NOTE — Patient Instructions (Signed)
Previously managed by Dr Nevada Crane.  Been on 2.5mg  daily Increase coumadin to 1 tablet daily except 2 tablets on Wednesdays Recheck on 12/29/17

## 2017-12-16 ENCOUNTER — Encounter (INDEPENDENT_AMBULATORY_CARE_PROVIDER_SITE_OTHER): Payer: Self-pay

## 2017-12-16 ENCOUNTER — Ambulatory Visit: Payer: PPO | Admitting: Internal Medicine

## 2017-12-16 ENCOUNTER — Encounter: Payer: Self-pay | Admitting: Internal Medicine

## 2017-12-16 VITALS — BP 136/78 | HR 70 | Ht 67.0 in | Wt 200.2 lb

## 2017-12-16 DIAGNOSIS — I5042 Chronic combined systolic (congestive) and diastolic (congestive) heart failure: Secondary | ICD-10-CM | POA: Diagnosis not present

## 2017-12-16 DIAGNOSIS — I480 Paroxysmal atrial fibrillation: Secondary | ICD-10-CM

## 2017-12-16 DIAGNOSIS — Z95810 Presence of automatic (implantable) cardiac defibrillator: Secondary | ICD-10-CM

## 2017-12-16 DIAGNOSIS — Z952 Presence of prosthetic heart valve: Secondary | ICD-10-CM

## 2017-12-16 NOTE — Progress Notes (Signed)
Ana Squibb, MD: Primary Cardiologist:  Dr Ana Martin Ana Martin is a 62 y.o. female with a h/o chronic systolic dysfunction sp BiV ICD (BTK) 2013 by Dr Ana Martin who presents today to establish care in the Electrophysiology device clinic.  She has an epicardial LV lead. 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  She had MVR (mechanical) 1999.  She developed afib/ atypical atrial flutter and underwent PPM with AV nodal ablation around 2009.  She subsequently was upgraded to a BiV ICD by Dr Ana Martin 04/29/11. The patient reports doing very well since having a BiV ICD implanted and remains very active despite her age.  She recently moved to [facility] and now follows with Dr Ana Martin.   Today, she  denies symptoms of palpitations, chest pain, shortness of breath, orthopnea, PND, lower extremity edema, dizziness, presyncope, syncope, or neurologic sequela.  The patientis tolerating medications without difficulties and is otherwise without complaint today.   Past Medical History:  Diagnosis Date  . Anxiety   . Atrial fibrillation ([place])   . Automatic implantable cardiac defibrillator in situ    Lumax DR-T 09-20-2007 Dr.Akbary  . Depression   . Hyperlipidemia   . 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 Hypertension   . Lump or mass in breast    at the 7 o'clock position of right breast(non-tender)  . Other abnormal glucose   . Other left bundle branch block   . Other primary cardiomyopathies   . Thyroid cancer ([place])   . Thyroid disease   . Unspecified vitamin D deficiency   . Wolff-Parkinson-White syndrome    Past Surgical History:  Procedure Laterality Date  . ABDOMINAL HYSTERECTOMY    . CARDIAC VALVE REPLACEMENT     st jude silzone mitray mechanical [id number] ss# [id number]  . COLONOSCOPY     April 12, 2008  . PACEMAKER PLACEMENT      Social History   Socioeconomic History  . Marital status: Divorced    Spouse name: Not on file  . Number of children:  Not on file  . Years of education: Not on file  . Highest education level: Not on file  Occupational History  . Not on file  Social Needs  . Financial resource strain: Not on file  . Food insecurity:    Worry: Not on file    Inability: Not on file  . Transportation needs:    Medical: Not on file    Non-medical: Not on file  Tobacco Use  . Smoking status: Former Smoker    Packs/day: 1.00    Years: 25.00    Pack years: 25.00    Types: Cigarettes    Last attempt to quit: 10/17/2009    Years since quitting: 8.1  . Smokeless tobacco: Never Used  Substance and Sexual Activity  . Alcohol use: No  . Drug use: No  . Sexual activity: Not Currently  Lifestyle  . Physical activity:    Days per week: Not on file    Minutes per session: Not on file  . Stress: Not on file  Relationships  . Social connections:    Talks on phone: Not on file    Gets together: Not on file    Attends religious service: Not on file    Active member of club or organization: Not on file    Attends meetings of clubs or organizations: Not on file    Relationship status: Not on file  .  Intimate partner violence:    Fear of current or ex partner: Not on file    Emotionally abused: Not on file    Physically abused: Not on file    Forced sexual activity: Not on file  Other Topics Concern  . Not on file  Social History Narrative  . Not on file    Family History  Problem Relation Age of Onset  . Lung cancer Father        expired 64  . Sarcoidosis Mother        espired 2013  . COPD Mother     Allergies  Allergen Reactions  . Codeine Nausea Only    Current Outpatient Medications  Medication Sig Dispense Refill  . albuterol (PROAIR HFA) 108 (90 Base) MCG/ACT inhaler Inhale 1 puff into the lungs every 4 (four) hours as needed.    . ALPRAZolam (XANAX) 0.5 MG tablet Take 0.5 mg by mouth daily as needed. for anxiety  0  . amiodarone (PACERONE) 200 MG tablet Take 1 tablet (200 mg total) by mouth daily.  30 tablet 0  . atorvastatin (LIPITOR) 40 MG tablet Take 40 mg by mouth every evening. for cholesterol  0  . budesonide-formoterol (SYMBICORT) 160-4.5 MCG/ACT inhaler Inhale 2 puffs into the lungs 2 (two) times daily.    . carvedilol (COREG) 12.5 MG tablet Take 12.5 mg by mouth 2 (two) times daily with a meal.    . clopidogrel (PLAVIX) 75 MG tablet Take 75 mg by mouth daily.     . furosemide (LASIX) 40 MG tablet Take 1 tablet (40 mg total) by mouth daily. 30 tablet 0  . guaiFENesin (MUCINEX) 600 MG 12 hr tablet Take 2 tablets (1,200 mg total) by mouth 2 (two) times daily. 10 tablet 0  . levothyroxine (SYNTHROID, LEVOTHROID) 200 MCG tablet Take 1 tablet (200 mcg total) by mouth daily before breakfast. 30 tablet 0  . losartan (COZAAR) 25 MG tablet Take 1 tablet by mouth daily.  4  . Melatonin 5 MG TABS Take 5-10 mg by mouth at bedtime.    . potassium chloride SA (K-DUR,KLOR-CON) 20 MEQ tablet Take 20 mEq by mouth daily as needed.     . venlafaxine XR (EFFEXOR XR) 75 MG 24 hr capsule Take 1 capsule (75 mg total) by mouth daily with breakfast. 30 capsule 0  . warfarin (COUMADIN) 2.5 MG tablet Take 2.5 mg by mouth as directed.     No current facility-administered medications for this visit.     ROS- all systems are reviewed and negative except as per HPI  Physical Exam: Vitals:   12/16/17 1135  BP: 136/78  Pulse: 70  SpO2: 98%  Weight: 200 lb 3.2 oz (90.8 kg)  Height: 5\' 7"  (1.702 m)    GEN- The patient is well appearing, alert and oriented x 3 today.   Head- normocephalic, atraumatic Eyes-  Sclera clear, conjunctiva pink Ears- hearing intact Oropharynx- clear Neck- supple,  Lungs- Clear to ausculation bilaterally, normal work of breathing Chest- pacemaker pocket is well healed Heart- Regular rate and rhythm (paced), mechanical S1 GI- soft, NT, ND, + BS Extremities- no clubbing, cyanosis, or edema MS- no significant deformity or atrophy Skin- no rash or lesion Psych- euthymic mood,  full affect Neuro- strength and sensation are intact  ekg reveals atypical atrial flutter, BiV paced  BiV ICD interrogation- reviewed in detail today,  See PACEART report  Assessment and Plan:  1. Chronic systolic dysfunction/ nonischemic CM/ complete heart block s/p AV  nodal ablation EF 30% S/p BiV ICD (BTK). Normal device function.  She has an epicardial LV lead Normal BiV ICD function See Pace Art report Changed atrial high rate to 150 bpm to better assess her arrhythmia burden.  2. Atypical arial flutter/ afib S/p AV nodal ablation On coumadin Atrial high rates reprogrammed to 150 bpm today to try to more accurately determine her atrial arrhythmia burden.  May consider programming VVIR if she does not have any sinus (which I suspect).  3. Mechanical MVR On coumadin I would like to stop plavix.  She will discuss this further with Dr Ana Martin when she see's him in December.  Remote monitoring She has 8% battery longevity.  Return to see me in 4 months We have discussed generator change today.  She can be scheduled without office visit if she reaches ERI in the interim. Would need INR day before the procedure to direct periprocedural anticoagulation. If she is still on plavix then, we would hold for 7 days prior to generator change  Remote monitoring Return to see me in [place] in 4 months  Thompson Grayer MD, [facility] 12/16/2017 12:06 PM

## 2017-12-16 NOTE — Patient Instructions (Addendum)
Medication Instructions:  Your physician recommends that you continue on your current medications as directed. Please refer to the Current Medication list given to you today.  Labwork: None ordered.  Testing/Procedures: None ordered.  Follow-Up: Your physician wants you to follow-up in: 4 months with Dr. Rayann Heman [place].     Remote monitoring is used to monitor your ICD from home. This monitoring reduces the number of office visits required to check your device to one time per year. It allows [place] to keep an eye on the functioning of your device to ensure it is working properly. You are scheduled for a device check from home on 03/17/2018. You may send your transmission at any time that day. If you have a wireless device, the transmission will be sent automatically. After your physician reviews your transmission, you will receive a postcard with your next transmission date.  Any Other Special Instructions Will Be Listed Below (If Applicable).  If you need a refill on your cardiac medications before your next appointment, please call your pharmacy.

## 2017-12-18 ENCOUNTER — Telehealth: Payer: Self-pay | Admitting: *Deleted

## 2017-12-18 LAB — CUP PACEART INCLINIC DEVICE CHECK
Battery Voltage: 2.92 V
Brady Statistic RA Percent Paced: 55 %
Brady Statistic RV Percent Paced: 100 %
HighPow Impedance: 76 Ohm
Implantable Lead Implant Date: 20090817
Implantable Lead Implant Date: 20130326
Implantable Lead Location: 753858
Implantable Lead Location: 753859
Implantable Lead Location: 753860
Implantable Lead Model: 350
Implantable Lead Model: 350
Implantable Lead Model: 511212
Implantable Lead Serial Number: 10331293
Implantable Lead Serial Number: 24873266
Lead Channel Impedance Value: 520 Ohm
Lead Channel Impedance Value: 564 Ohm
Lead Channel Pacing Threshold Amplitude: 1.4 V
Lead Channel Pacing Threshold Pulse Width: 0.5 ms
Lead Channel Setting Pacing Amplitude: 2.25 V
Lead Channel Setting Pacing Pulse Width: 0.4 ms
Lead Channel Setting Pacing Pulse Width: 0.5 ms
Lead Channel Setting Sensing Sensitivity: 0.8 mV
MDC IDC LEAD IMPLANT DT: 20090817
MDC IDC LEAD SERIAL: 195045
MDC IDC MSMT LEADCHNL LV IMPEDANCE VALUE: 433 Ohm
MDC IDC MSMT LEADCHNL LV SENSING INTR AMPL: 11 mV
MDC IDC MSMT LEADCHNL RA SENSING INTR AMPL: 1.2 mV
MDC IDC MSMT LEADCHNL RV PACING THRESHOLD AMPLITUDE: 0.7 V
MDC IDC MSMT LEADCHNL RV PACING THRESHOLD PULSEWIDTH: 0.4 ms
MDC IDC MSMT LEADCHNL RV SENSING INTR AMPL: 11.2 mV
MDC IDC PG IMPLANT DT: 20130326
MDC IDC SESS DTM: 20191113164300
MDC IDC SET LEADCHNL LV SENSING SENSITIVITY: 1.6 mV
MDC IDC SET LEADCHNL RA PACING AMPLITUDE: 2 V
MDC IDC SET LEADCHNL RV PACING AMPLITUDE: 2 V
Pulse Gen Serial Number: 60669809

## 2017-12-18 NOTE — Telephone Encounter (Signed)
Reviewed EGM and device data with Dr. Toney Rakes immediate changes necessary. Plan to review with Dr. Rayann Heman next week.  Patient made aware of recommendations and she is agreeable. She is aware I will call back if Dr. Rayann Heman recommends any changes. Otherwise, we will continue remote monitoring. Discussed clinic shock plan and patient verbalizes understanding of instructions to seek emergency medical attention for new or worsening cardiac symptoms. She is appreciative of call and denies additional questions or concerns at this time.

## 2017-12-18 NOTE — Telephone Encounter (Signed)
Spoke with patient to advise that remote monitoring has successfully been transferred to our clinic. Received alert today for BiV pacing 78% for past 24hr, historically 100% BiVp per notes from [facility] in [place]. AHR recording from 12/17/17 at 9:17pm shows A-tach/RVs with LV trigger pacing at 89bpm.   Patient reports she is feeling well. She has been cleaning her house today since she feels good. Slight dizziness when getting up off the floor from cleaning the base boards, but no other symptoms. She reports she is taking all medications as prescribed.  Advised patient I will review EGM with Dr. Caryl Comes and call her back with any recommendations. She is agreeable to this plan.

## 2017-12-21 NOTE — Telephone Encounter (Signed)
Reviewed device reports and data with Dr. Rayann Heman. He recommends no changes at this time. Plan to keep monitoring remotely via Home Monitoring.

## 2017-12-29 ENCOUNTER — Ambulatory Visit (INDEPENDENT_AMBULATORY_CARE_PROVIDER_SITE_OTHER): Payer: PPO | Admitting: *Deleted

## 2017-12-29 DIAGNOSIS — I4891 Unspecified atrial fibrillation: Secondary | ICD-10-CM | POA: Diagnosis not present

## 2017-12-29 DIAGNOSIS — Z5181 Encounter for therapeutic drug level monitoring: Secondary | ICD-10-CM | POA: Diagnosis not present

## 2017-12-29 DIAGNOSIS — Z952 Presence of prosthetic heart valve: Secondary | ICD-10-CM

## 2017-12-29 LAB — POCT INR: INR: 2.9 (ref 2.0–3.0)

## 2017-12-29 NOTE — Patient Instructions (Signed)
Previously managed by Dr Nevada Crane.  Been on 2.5mg  daily Continue coumadin 1 tablet daily except 2 tablets on Wednesdays Recheck in 4 weeks

## 2018-01-07 ENCOUNTER — Encounter: Payer: Self-pay | Admitting: Cardiology

## 2018-01-07 ENCOUNTER — Ambulatory Visit: Payer: PPO | Admitting: Cardiology

## 2018-01-07 VITALS — BP 138/78 | HR 72 | Ht 67.0 in | Wt 205.4 lb

## 2018-01-07 DIAGNOSIS — I5022 Chronic systolic (congestive) heart failure: Secondary | ICD-10-CM

## 2018-01-07 DIAGNOSIS — I480 Paroxysmal atrial fibrillation: Secondary | ICD-10-CM | POA: Diagnosis not present

## 2018-01-07 DIAGNOSIS — E782 Mixed hyperlipidemia: Secondary | ICD-10-CM

## 2018-01-07 DIAGNOSIS — Z952 Presence of prosthetic heart valve: Secondary | ICD-10-CM

## 2018-01-07 MED ORDER — SPIRONOLACTONE 25 MG PO TABS: 12.5000 mg | ORAL_TABLET | Freq: Every day | ORAL | 1 refills | Status: DC

## 2018-01-07 NOTE — Progress Notes (Signed)
Clinical Summary Ana Martin is a 62 y.o.female seen today for follow up of the following medical problems.     1. Chronic systolic HF - 08/4126 [facility] echo LVEF 30-35% - has BiV ICD Biotronik followed by EP   11/2017 echo LVEF 35-40% - she reports entersto is $250 a month after discusing with her pharmacist - compliant with meds, no recent LE edema, SOB or DOE.    2. Atrial flutter - history of prior ablation - no palpitations.   3. PAF - notes indicate history of prior av node ablation, pacer dependent.  - has been on coumadin for anticoag and low dose amiodarone (09/2017 TSH 4.1), .  - no bleeding on coumadin. Has also been on plavix for several approx 20 years for unlcear etiology based on initial chart review - coumadin levels followed by pcp  - no palpitations - compliant with meds  4. 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  - on coumadin with INR checks followed by pcp   5. COPD - followed by pcp  6. Hyperlipidemia - remains compliant with statin.   7. Depression - recent admission 09/2017  8. History of TIA - per report, unclear if this may have been when plavix was started.    SH: her sister is Ana Martin who is also a patient of mine.   Past Medical History:  Diagnosis Date  . Anxiety   . Atrial fibrillation ([place])   . Automatic implantable cardiac defibrillator in situ    Lumax DR-T 09-20-2007 Dr.Akbary  . Depression   . Hyperlipidemia   . 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 Hypertension   . Lump or mass in breast    at the 7 o'clock position of right breast(non-tender)  . Other abnormal glucose   . Other left bundle [place] block   . Other primary cardiomyopathies   . Thyroid cancer ([place])   . Thyroid disease   . Unspecified vitamin D deficiency   . Wolff-Parkinson-White syndrome      Allergies  Allergen Reactions  . Codeine Nausea Only     Current Outpatient  Medications  Medication Sig Dispense Refill  . albuterol (PROAIR HFA) 108 (90 Base) MCG/ACT inhaler Inhale 1 puff into the lungs every 4 (four) hours as needed.    . ALPRAZolam (XANAX) 0.5 MG tablet Take 0.5 mg by mouth daily as needed. for anxiety  0  . amiodarone (PACERONE) 200 MG tablet Take 1 tablet (200 mg total) by mouth daily. 30 tablet 0  . atorvastatin (LIPITOR) 40 MG tablet Take 40 mg by mouth every evening. for cholesterol  0  . budesonide-formoterol (SYMBICORT) 160-4.5 MCG/ACT inhaler Inhale 2 puffs into the lungs 2 (two) times daily.    . carvedilol (COREG) 12.5 MG tablet Take 12.5 mg by mouth 2 (two) times daily with a meal.    . clopidogrel (PLAVIX) 75 MG tablet Take 75 mg by mouth daily.     . furosemide (LASIX) 40 MG tablet Take 1 tablet (40 mg total) by mouth daily. 30 tablet 0  . guaiFENesin (MUCINEX) 600 MG 12 hr tablet Take 2 tablets (1,200 mg total) by mouth 2 (two) times daily. 10 tablet 0  . levothyroxine (SYNTHROID, LEVOTHROID) 200 MCG tablet Take 1 tablet (200 mcg total) by mouth daily before breakfast. 30 tablet 0  . losartan (COZAAR) 25 MG tablet Take 1 tablet by mouth daily.  4  .  Melatonin 5 MG TABS Take 5-10 mg by mouth at bedtime.    . potassium chloride SA (K-DUR,KLOR-CON) 20 MEQ tablet Take 20 mEq by mouth daily as needed.     . venlafaxine XR (EFFEXOR XR) 75 MG 24 hr capsule Take 1 capsule (75 mg total) by mouth daily with breakfast. 30 capsule 0  . warfarin (COUMADIN) 2.5 MG tablet Take 2.5 mg by mouth as directed.     No current facility-administered medications for this visit.      Past Surgical History:  Procedure Laterality Date  . ABDOMINAL HYSTERECTOMY    . CARDIAC VALVE REPLACEMENT     st jude silzone mitray mechanical [id number] ss# [id number]  . COLONOSCOPY     April 12, 2008  . PACEMAKER PLACEMENT       Allergies  Allergen Reactions  . Codeine Nausea Only      Family History  Problem Relation Age of Onset  . Lung cancer Father         expired 24  . Sarcoidosis Mother        espired 2013  . COPD Mother      Social History Ms. [name] reports that she quit smoking about 8 years ago. Her smoking use included cigarettes. She has a 25.00 pack-year smoking history. She has never used smokeless tobacco. Ms. [name] reports that she does not drink alcohol.   Review of Systems CONSTITUTIONAL: No weight loss, fever, chills, weakness or fatigue.  HEENT: Eyes: No visual loss, blurred vision, double vision or yellow sclerae.No hearing loss, sneezing, congestion, runny nose or sore throat.  SKIN: No rash or itching.  CARDIOVASCULAR: per hpi RESPIRATORY: No shortness of breath, cough or sputum.  GASTROINTESTINAL: No anorexia, nausea, vomiting or diarrhea. No abdominal pain or blood.  GENITOURINARY: No burning on urination, no polyuria NEUROLOGICAL: No headache, dizziness, syncope, paralysis, ataxia, numbness or tingling in the extremities. No change in bowel or bladder control.  MUSCULOSKELETAL: No muscle, back pain, joint pain or stiffness.  LYMPHATICS: No enlarged nodes. No history of splenectomy.  PSYCHIATRIC: No history of depression or anxiety.  ENDOCRINOLOGIC: No reports of sweating, cold or heat intolerance. No polyuria or polydipsia.  Marland Kitchen   Physical Examination Vitals:   01/07/18 1013  BP: 138/78  Pulse: 72  SpO2: 95%   Vitals:   01/07/18 1013  Weight: 205 lb 6.4 oz (93.2 kg)  Height: 5\' 7"  (1.702 m)    Gen: resting comfortably, no acute distress HEENT: no scleral icterus, pupils equal round and reactive, no palptable cervical adenopathy,  CV: RRR, mechanical S1, no m/r/g, no jvd Resp: Clear to auscultation bilaterally GI: abdomen is soft, non-tender, non-distended, normal bowel sounds, no hepatosplenomegaly MSK: extremities are warm, no edema.  Skin: warm, no rash Neuro:  no focal deficits Psych: appropriate affect   Diagnostic Studies 08/2015 echo Findings Mitral Valve Mechanical prosthetic  valve in mitral position. Aortic Valve The aortic valve leaflets were not well visualized. Tricuspid Valve Tricuspid valve was not well visualized Pulmonic Valve The pulmonic valve was not well visualized Left Atrium Normal left atrium. Left Ventricle Moderately dilated left ventricle. Moderate to severe global LV hypokinesis. Ejection fraction is visually estimated at 30-35% Right Atrium Mild Right atrial enlargement Right Ventricle Mildly dilated right ventricle. Pacer and/or defibrillator wires visualized in right ventricle. Pericardial Effusion No evidence of pericardial effusion.   11/2017 echo w contrast Study Conclusions  - Limited study with echocontrast to evaluate LV function. - Left ventricle: Systolic function was moderately reduced.  The   estimated ejection fraction was in the range of 35% to 40%.   Diffuse hypokinesis.  11/2017 echo Study Conclusions  - Left ventricle: The cavity size was normal. Wall thickness was   increased in a pattern of mild LVH. Systolic function was   moderately reduced. The estimated ejection fraction was in the   range of 35% to 40%. Unable to determine diastolic function in   setting of MVR. - Mitral valve: There is a mechanical valve unknown type or size in   the MV position. - Left atrium: The atrium was severely dilated. - Right ventricle: Poorly visualized. TAPSE would suggest mild   systolic dysfunction. TAPSE: 14.1 mm . - Technically difficult study.  Impressions:  - LVEF appears to be around 35-40%. Some limitation in the   visualization of the endocardium, consider limited study with   echocontrast   Assessment and Plan  1. Chronic systolic HF - no recent symptoms. - we will start aldactone 12.5mg  daily, check BMET/Mg in 2 weeks - appears entrest will be too expensive for her, we will give her info on assistance program, continue losartan for now - continue to titrate CHF meds as tolerated at f/u  2.  Mechanical MVR - on coumadin, followed by pcp - we will d/c plavix, start ASA 81mg  in setting of mechanical valve. Remains unclear exact indication for plavix after extensive chart review from previous providers. I think coumadin and ASA as indicated for her mechanical MV is more than adequate therapy for any prior indication for plavix.   3. PAF - history of av nodal ablation, pacer dependent. Has been on amio - no symptoms, continue current meds  4. Hyperlipidemia - continue statin.      F/u 6 weeks      Arnoldo Lenis, M.D.

## 2018-01-07 NOTE — Patient Instructions (Signed)
Your physician recommends that you schedule a follow-up appointment in: [place] has recommended you make the following change in your medication:   STOP PLAVIX  STOP POTASSIUM  START ALDACTONE 12.5 MG DAILY (1/2 TABLET)  START ASPIRIN   Your physician recommends that you return for lab work in: 2 WEEKS BMP/MG  Thank you for choosing [facility]!!

## 2018-01-13 ENCOUNTER — Ambulatory Visit (INDEPENDENT_AMBULATORY_CARE_PROVIDER_SITE_OTHER): Payer: PPO

## 2018-01-13 DIAGNOSIS — I5022 Chronic systolic (congestive) heart failure: Secondary | ICD-10-CM

## 2018-01-13 DIAGNOSIS — I428 Other cardiomyopathies: Secondary | ICD-10-CM

## 2018-01-15 ENCOUNTER — Encounter: Payer: Self-pay | Admitting: Cardiology

## 2018-01-15 NOTE — Progress Notes (Signed)
Remote ICD transmission.   

## 2018-01-19 ENCOUNTER — Telehealth: Payer: Self-pay | Admitting: Cardiology

## 2018-01-19 DIAGNOSIS — Z952 Presence of prosthetic heart valve: Secondary | ICD-10-CM

## 2018-01-19 DIAGNOSIS — I4891 Unspecified atrial fibrillation: Secondary | ICD-10-CM

## 2018-01-19 DIAGNOSIS — Z5181 Encounter for therapeutic drug level monitoring: Secondary | ICD-10-CM

## 2018-01-19 NOTE — Telephone Encounter (Signed)
has to pick grandson up from school- wanting to know if she can have done when she goes to have labs for [employer]

## 2018-01-19 NOTE — Telephone Encounter (Signed)
Printed out lab request for PT/INR.  Pt will have drawn with other labs for Dr Harl Bowie on 01/20/18 at [place].

## 2018-01-21 DIAGNOSIS — Z952 Presence of prosthetic heart valve: Secondary | ICD-10-CM | POA: Diagnosis not present

## 2018-01-21 DIAGNOSIS — I5042 Chronic combined systolic (congestive) and diastolic (congestive) heart failure: Secondary | ICD-10-CM | POA: Diagnosis not present

## 2018-01-21 DIAGNOSIS — Z5181 Encounter for therapeutic drug level monitoring: Secondary | ICD-10-CM | POA: Diagnosis not present

## 2018-01-21 DIAGNOSIS — I4891 Unspecified atrial fibrillation: Secondary | ICD-10-CM | POA: Diagnosis not present

## 2018-01-21 LAB — PROTIME-INR: INR: 3.3 — AB (ref 0.9–1.1)

## 2018-01-25 ENCOUNTER — Encounter: Payer: Self-pay | Admitting: *Deleted

## 2018-01-25 ENCOUNTER — Telehealth: Payer: Self-pay | Admitting: Cardiology

## 2018-01-25 NOTE — Telephone Encounter (Signed)
Patient called requesting test results of recent labs.

## 2018-01-25 NOTE — Telephone Encounter (Signed)
Patient says she had labs done at [facility]. Labs requested

## 2018-01-26 ENCOUNTER — Telehealth: Payer: Self-pay | Admitting: *Deleted

## 2018-01-26 ENCOUNTER — Ambulatory Visit (INDEPENDENT_AMBULATORY_CARE_PROVIDER_SITE_OTHER): Payer: PPO | Admitting: *Deleted

## 2018-01-26 DIAGNOSIS — I4891 Unspecified atrial fibrillation: Secondary | ICD-10-CM | POA: Diagnosis not present

## 2018-01-26 NOTE — Patient Instructions (Signed)
Previously managed by Dr Nevada Crane.  Been on 2.5mg  daily Continue coumadin 1 tablet daily except 2 tablets on Wednesdays Recheck in 4 weeks

## 2018-01-26 NOTE — Telephone Encounter (Signed)
Called pt.  See coumadin note. 

## 2018-01-26 NOTE — Telephone Encounter (Signed)
Please call patient in regards to her recent INR report .

## 2018-01-29 ENCOUNTER — Other Ambulatory Visit: Payer: Self-pay | Admitting: Internal Medicine

## 2018-01-29 ENCOUNTER — Telehealth: Payer: Self-pay

## 2018-01-29 NOTE — Telephone Encounter (Signed)
Called pt. No answer, left message for pt to return call.  

## 2018-01-29 NOTE — Telephone Encounter (Signed)
-----   Message from Arnoldo Lenis, MD sent at 01/29/2018  1:48 PM EST ----- Labs look good   Zandra Abts MD

## 2018-02-01 ENCOUNTER — Telehealth: Payer: Self-pay | Admitting: Cardiology

## 2018-02-01 NOTE — Telephone Encounter (Signed)
Informed patient that she will need to have this filled by her pmd as office does not manage thyroid medications.  She verbalized understanding & stated she would call Dr. Nevada Crane.

## 2018-02-01 NOTE — Telephone Encounter (Signed)
°*  STAT* If patient is at the pharmacy, call can be transferred to refill team.   1. Which medications need to be refilled?   levothyroxine (SYNTHROID, LEVOTHROID) 200 MCG tablet     2. Which pharmacy/location (including street and city if local pharmacy) is medication to be sent to? [facility]  3. Do they need a 30 day or 90 day supply?    Out of medication for two days

## 2018-02-01 NOTE — Telephone Encounter (Signed)
Left message to return call 

## 2018-02-15 ENCOUNTER — Ambulatory Visit (INDEPENDENT_AMBULATORY_CARE_PROVIDER_SITE_OTHER): Payer: PPO

## 2018-02-15 DIAGNOSIS — I428 Other cardiomyopathies: Secondary | ICD-10-CM | POA: Diagnosis not present

## 2018-02-16 ENCOUNTER — Ambulatory Visit: Payer: Self-pay | Admitting: Cardiology

## 2018-02-16 NOTE — Progress Notes (Signed)
Remote ICD transmission.   

## 2018-02-19 LAB — CUP PACEART REMOTE DEVICE CHECK
Date Time Interrogation Session: 20200117193919
Implantable Lead Implant Date: 20090817
Implantable Lead Implant Date: 20090817
Implantable Lead Implant Date: 20130326
Implantable Lead Location: 753858
Implantable Lead Location: 753860
Implantable Lead Model: 350
Implantable Lead Model: 350
Implantable Lead Model: 511212
Implantable Lead Serial Number: 10331293
Implantable Lead Serial Number: 195045
Implantable Lead Serial Number: 24873266
Implantable Pulse Generator Implant Date: 20130326
MDC IDC LEAD LOCATION: 753859
Pulse Gen Serial Number: 60669809

## 2018-02-23 ENCOUNTER — Ambulatory Visit (INDEPENDENT_AMBULATORY_CARE_PROVIDER_SITE_OTHER): Payer: PPO | Admitting: Pharmacist

## 2018-02-23 DIAGNOSIS — Z952 Presence of prosthetic heart valve: Secondary | ICD-10-CM | POA: Diagnosis not present

## 2018-02-23 DIAGNOSIS — I4891 Unspecified atrial fibrillation: Secondary | ICD-10-CM

## 2018-02-23 DIAGNOSIS — Z5181 Encounter for therapeutic drug level monitoring: Secondary | ICD-10-CM | POA: Diagnosis not present

## 2018-02-23 LAB — POCT INR: INR: 3.7 — AB (ref 2.0–3.0)

## 2018-02-23 NOTE — Patient Instructions (Signed)
Description   Previously managed by Dr Nevada Crane.  Been on 2.5mg  daily No warfarin today then continue coumadin 1 tablet daily except 2 tablets on Wednesdays Recheck in 3 weeks

## 2018-02-24 ENCOUNTER — Ambulatory Visit: Payer: Self-pay | Admitting: Cardiology

## 2018-02-26 ENCOUNTER — Ambulatory Visit: Payer: Self-pay | Admitting: Cardiology

## 2018-02-27 LAB — CUP PACEART REMOTE DEVICE CHECK
Date Time Interrogation Session: 20200125151403
Implantable Lead Implant Date: 20090817
Implantable Lead Implant Date: 20130326
Implantable Lead Location: 753858
Implantable Lead Location: 753859
Implantable Lead Location: 753860
Implantable Lead Model: 350
Implantable Lead Model: 350
Implantable Lead Model: 511212
Implantable Lead Serial Number: 10331293
Implantable Lead Serial Number: 195045
Implantable Pulse Generator Implant Date: 20130326
MDC IDC LEAD IMPLANT DT: 20090817
MDC IDC LEAD SERIAL: 24873266
Pulse Gen Serial Number: 60669809

## 2018-03-12 ENCOUNTER — Telehealth: Payer: Self-pay | Admitting: Cardiology

## 2018-03-12 MED ORDER — WARFARIN SODIUM 2.5 MG PO TABS: ORAL_TABLET | ORAL | 0 refills | Status: DC

## 2018-03-12 NOTE — Telephone Encounter (Signed)
°*  STAT* If patient is at the pharmacy, call can be transferred to refill team.   1. Which medications need to be refilled? (please list name of each medication and dose if known) warfarin (COUMADIN) 2.5 MG tablet     2. Which pharmacy/location (including street and city if local pharmacy) is medication to be sent to? [facility]  3. Do they need a 30 day or 90 day supply?

## 2018-03-12 NOTE — Telephone Encounter (Signed)
Refill sent in

## 2018-03-16 ENCOUNTER — Ambulatory Visit (INDEPENDENT_AMBULATORY_CARE_PROVIDER_SITE_OTHER): Payer: PPO | Admitting: Pharmacist

## 2018-03-16 DIAGNOSIS — Z5181 Encounter for therapeutic drug level monitoring: Secondary | ICD-10-CM | POA: Diagnosis not present

## 2018-03-16 DIAGNOSIS — Z952 Presence of prosthetic heart valve: Secondary | ICD-10-CM | POA: Diagnosis not present

## 2018-03-16 DIAGNOSIS — I4891 Unspecified atrial fibrillation: Secondary | ICD-10-CM | POA: Diagnosis not present

## 2018-03-16 LAB — POCT INR: INR: 4.3 — AB (ref 2.0–3.0)

## 2018-03-16 NOTE — Patient Instructions (Signed)
Description   Skip your Coumadin today, then continue taking 1 tablet daily except 2 tablets on Wednesdays Recheck in 2-3 weeks

## 2018-04-01 ENCOUNTER — Ambulatory Visit (INDEPENDENT_AMBULATORY_CARE_PROVIDER_SITE_OTHER): Payer: PPO | Admitting: *Deleted

## 2018-04-01 DIAGNOSIS — Z5181 Encounter for therapeutic drug level monitoring: Secondary | ICD-10-CM | POA: Diagnosis not present

## 2018-04-01 DIAGNOSIS — I4891 Unspecified atrial fibrillation: Secondary | ICD-10-CM

## 2018-04-01 DIAGNOSIS — Z952 Presence of prosthetic heart valve: Secondary | ICD-10-CM | POA: Diagnosis not present

## 2018-04-01 LAB — POCT INR: INR: 4.9 — AB (ref 2.0–3.0)

## 2018-04-01 NOTE — Patient Instructions (Signed)
Skip your Coumadin today, then decrease dose to 1 tablet daily Recheck at Dr Rayann Heman appt on 3/6

## 2018-04-07 ENCOUNTER — Encounter: Payer: Self-pay | Admitting: Internal Medicine

## 2018-04-09 ENCOUNTER — Ambulatory Visit (INDEPENDENT_AMBULATORY_CARE_PROVIDER_SITE_OTHER): Payer: PPO | Admitting: Pharmacist

## 2018-04-09 ENCOUNTER — Ambulatory Visit: Payer: PPO | Admitting: Internal Medicine

## 2018-04-09 ENCOUNTER — Encounter: Payer: Self-pay | Admitting: Internal Medicine

## 2018-04-09 VITALS — BP 122/70 | HR 71 | Ht 67.0 in | Wt 209.0 lb

## 2018-04-09 DIAGNOSIS — Z952 Presence of prosthetic heart valve: Secondary | ICD-10-CM

## 2018-04-09 DIAGNOSIS — I428 Other cardiomyopathies: Secondary | ICD-10-CM | POA: Diagnosis not present

## 2018-04-09 DIAGNOSIS — Z95810 Presence of automatic (implantable) cardiac defibrillator: Secondary | ICD-10-CM | POA: Diagnosis not present

## 2018-04-09 DIAGNOSIS — I5022 Chronic systolic (congestive) heart failure: Secondary | ICD-10-CM | POA: Diagnosis not present

## 2018-04-09 DIAGNOSIS — I4891 Unspecified atrial fibrillation: Secondary | ICD-10-CM

## 2018-04-09 DIAGNOSIS — Z5181 Encounter for therapeutic drug level monitoring: Secondary | ICD-10-CM | POA: Diagnosis not present

## 2018-04-09 LAB — CUP PACEART INCLINIC DEVICE CHECK
Date Time Interrogation Session: 20200306103749
Implantable Lead Implant Date: 20090817
Implantable Lead Implant Date: 20090817
Implantable Lead Implant Date: 20130326
Implantable Lead Location: 753858
Implantable Lead Location: 753859
Implantable Lead Location: 753860
Implantable Lead Model: 350
Implantable Lead Model: 350
Implantable Lead Serial Number: 10331293
Implantable Lead Serial Number: 24873266
Implantable Pulse Generator Implant Date: 20130326
MDC IDC LEAD SERIAL: 195045
Pulse Gen Serial Number: 60669809

## 2018-04-09 LAB — POCT INR: INR: 3.2 — AB (ref 2.0–3.0)

## 2018-04-09 NOTE — Patient Instructions (Signed)
Description   Continue dose to 1 tablet daily Recheck in 1 week

## 2018-04-09 NOTE — Patient Instructions (Signed)
Medication Instructions:   Your physician recommends that you continue on your current medications as directed. Please refer to the Current Medication list given to you today.  Labwork:  NONE  Testing/Procedures:  NONE  Follow-Up:  Your physician recommends that you schedule a follow-up appointment in: 3 months.  Any Other Special Instructions Will Be Listed Below (If Applicable).  If you need a refill on your cardiac medications before your next appointment, please call your pharmacy. 

## 2018-04-09 NOTE — Progress Notes (Signed)
PCP: Celene Squibb, MD Primary Cardiologist: Dr Harl Bowie Primary EP: Dr Rayann Heman  Ana Martin is a 63 y.o. female who presents today for routine electrophysiology followup.  Since last being seen in our clinic, the patient reports doing reasonably well.  She has rare palpitations. She has depression.  She does not feel well in general.  She is smoking again.  Today, she denies symptoms of chest pain, shortness of breath,  lower extremity edema, dizziness, presyncope, syncope, or ICD shocks.  The patient is otherwise without complaint today.   Past Medical History:  Diagnosis Date  . Anxiety   . Atrial fibrillation ([place])   . Automatic implantable cardiac defibrillator in situ    Lumax DR-T 09-20-2007 Dr.Akbary  . Depression   . Hyperlipidemia   . 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 Hypertension   . Lump or mass in breast    at the 7 o'clock position of right breast(non-tender)  . Other abnormal glucose   . Other left bundle branch block   . Other primary cardiomyopathies   . Thyroid cancer ([place])   . Thyroid disease   . Unspecified vitamin D deficiency   . Wolff-Parkinson-White syndrome    Past Surgical History:  Procedure Laterality Date  . ABDOMINAL HYSTERECTOMY    . CARDIAC VALVE REPLACEMENT     st jude silzone mitray mechanical [id number] ss# [id number]  . COLONOSCOPY     April 12, 2008  . PACEMAKER PLACEMENT      ROS- all systems are reviewed and negative except as per HPI above  Current Outpatient Medications  Medication Sig Dispense Refill  . albuterol (PROAIR HFA) 108 (90 Base) MCG/ACT inhaler Inhale 1 puff into the lungs every 4 (four) hours as needed.    . ALPRAZolam (XANAX) 0.5 MG tablet Take 0.5 mg by mouth daily as needed. for anxiety  0  . amiodarone (PACERONE) 200 MG tablet Take 1 tablet (200 mg total) by mouth daily. 30 tablet 0  . aspirin EC 81 MG tablet Take 81 mg by mouth daily.    Marland Kitchen atorvastatin (LIPITOR) 40 MG tablet Take  40 mg by mouth every evening. for cholesterol  0  . budesonide-formoterol (SYMBICORT) 160-4.5 MCG/ACT inhaler Inhale 2 puffs into the lungs 2 (two) times daily.    . carvedilol (COREG) 12.5 MG tablet Take 12.5 mg by mouth 2 (two) times daily with a meal.    . furosemide (LASIX) 40 MG tablet Take 1 tablet (40 mg total) by mouth daily. 30 tablet 0  . guaiFENesin (MUCINEX) 600 MG 12 hr tablet Take 2 tablets (1,200 mg total) by mouth 2 (two) times daily. 10 tablet 0  . levothyroxine (SYNTHROID, LEVOTHROID) 200 MCG tablet Take 1 tablet (200 mcg total) by mouth daily before breakfast. 30 tablet 0  . losartan (COZAAR) 25 MG tablet Take 1 tablet by mouth daily.  4  . Melatonin 5 MG TABS Take 5-10 mg by mouth at bedtime.    Marland Kitchen venlafaxine XR (EFFEXOR XR) 75 MG 24 hr capsule Take 1 capsule (75 mg total) by mouth daily with breakfast. 30 capsule 0  . warfarin (COUMADIN) 2.5 MG tablet Take 1-2 tablets daily as directed by Coumadin clinic. 105 tablet 0  . spironolactone (ALDACTONE) 25 MG tablet Take 0.5 tablets (12.5 mg total) by mouth daily. 45 tablet 1   No current facility-administered medications for this visit.     Physical Exam: Vitals:   04/09/18 1120  BP: 122/70  Pulse: 71  SpO2: 91%  Weight: 209 lb (94.8 kg)  Height: 5\' 7"  (1.702 m)    GEN- The patient is well appearing, alert and oriented x 3 today.   Head- normocephalic, atraumatic Eyes-  Sclera clear, conjunctiva pink Ears- hearing intact Oropharynx- clear Lungs- Clear to ausculation bilaterally, normal work of breathing Chest- ICD pocket is well healed Heart- Regular rate and rhythm mechanical S1 GI- soft, NT, ND, + BS Extremities- no clubbing, cyanosis, or edema  ICD interrogation- reviewed in detail today,  See PACEART report  ekg tracing ordered today is personally reviewed and shows AV paced  Wt Readings from Last 3 Encounters:  04/09/18 209 lb (94.8 kg)  01/07/18 205 lb 6.4 oz (93.2 kg)  12/16/17 200 lb 3.2 oz (90.8 kg)     Assessment and Plan:  1.  Chronic systolic dysfunction/ complete heart block/ nonischemic CM euvolemic today Stable on an appropriate medical regimen Normal ICD function See Pace Art report No changes today   2. Afib/ atypical atrial flutter S/p AV nodal ablation On coumadin She is in sinus today  3. Mechanical MVR On coumadin  4. Tobacco She is ready to quit and has a plan  Remote monitoring 3%  battery longevity (likely 2-3 months) Risks, benefits, and alternatives to ICD pulse generator replacement were discussed in detail today.  The patient understands that risks include but are not limited to bleeding, infection, pneumothorax, perforation, tamponade, vascular damage, renal failure, MI, stroke, death, inappropriate shocks, damage to his existing leads, and lead dislodgement and wishes to proceed once ERI.  If ERI before next office visit, ok to schedule generator change  Remote monitoring Return in 3 months  Thompson Grayer MD, [facility] 04/09/2018 11:34 AM

## 2018-04-23 DIAGNOSIS — E782 Mixed hyperlipidemia: Secondary | ICD-10-CM | POA: Diagnosis not present

## 2018-04-23 DIAGNOSIS — I10 Essential (primary) hypertension: Secondary | ICD-10-CM | POA: Diagnosis not present

## 2018-04-23 DIAGNOSIS — N183 Chronic kidney disease, stage 3 (moderate): Secondary | ICD-10-CM | POA: Diagnosis not present

## 2018-04-23 DIAGNOSIS — E039 Hypothyroidism, unspecified: Secondary | ICD-10-CM | POA: Diagnosis not present

## 2018-04-27 ENCOUNTER — Ambulatory Visit (INDEPENDENT_AMBULATORY_CARE_PROVIDER_SITE_OTHER): Payer: PPO | Admitting: *Deleted

## 2018-04-27 DIAGNOSIS — I4891 Unspecified atrial fibrillation: Secondary | ICD-10-CM | POA: Diagnosis not present

## 2018-04-27 DIAGNOSIS — Z5181 Encounter for therapeutic drug level monitoring: Secondary | ICD-10-CM

## 2018-04-27 DIAGNOSIS — Z952 Presence of prosthetic heart valve: Secondary | ICD-10-CM

## 2018-04-27 LAB — POCT INR: INR: 5.9 — AB (ref 2.0–3.0)

## 2018-04-27 NOTE — Patient Instructions (Signed)
Hold coumadin tonight and tomorrow night Eat extra greens/salads Recheck Thursday

## 2018-04-28 DIAGNOSIS — I10 Essential (primary) hypertension: Secondary | ICD-10-CM | POA: Diagnosis not present

## 2018-04-28 DIAGNOSIS — I5022 Chronic systolic (congestive) heart failure: Secondary | ICD-10-CM | POA: Diagnosis not present

## 2018-04-28 DIAGNOSIS — F332 Major depressive disorder, recurrent severe without psychotic features: Secondary | ICD-10-CM | POA: Diagnosis not present

## 2018-04-28 DIAGNOSIS — E782 Mixed hyperlipidemia: Secondary | ICD-10-CM | POA: Diagnosis not present

## 2018-04-28 DIAGNOSIS — E1169 Type 2 diabetes mellitus with other specified complication: Secondary | ICD-10-CM | POA: Diagnosis not present

## 2018-04-28 DIAGNOSIS — E039 Hypothyroidism, unspecified: Secondary | ICD-10-CM | POA: Diagnosis not present

## 2018-04-28 DIAGNOSIS — I4891 Unspecified atrial fibrillation: Secondary | ICD-10-CM | POA: Diagnosis not present

## 2018-04-28 DIAGNOSIS — N182 Chronic kidney disease, stage 2 (mild): Secondary | ICD-10-CM | POA: Diagnosis not present

## 2018-04-28 DIAGNOSIS — Z95810 Presence of automatic (implantable) cardiac defibrillator: Secondary | ICD-10-CM | POA: Diagnosis not present

## 2018-04-28 DIAGNOSIS — J449 Chronic obstructive pulmonary disease, unspecified: Secondary | ICD-10-CM | POA: Diagnosis not present

## 2018-04-28 DIAGNOSIS — Z952 Presence of prosthetic heart valve: Secondary | ICD-10-CM | POA: Diagnosis not present

## 2018-04-29 ENCOUNTER — Ambulatory Visit (INDEPENDENT_AMBULATORY_CARE_PROVIDER_SITE_OTHER): Payer: PPO | Admitting: *Deleted

## 2018-04-29 DIAGNOSIS — Z5181 Encounter for therapeutic drug level monitoring: Secondary | ICD-10-CM

## 2018-04-29 DIAGNOSIS — Z952 Presence of prosthetic heart valve: Secondary | ICD-10-CM

## 2018-04-29 DIAGNOSIS — I4891 Unspecified atrial fibrillation: Secondary | ICD-10-CM

## 2018-04-29 LAB — POCT INR: INR: 3.3 — AB (ref 2.0–3.0)

## 2018-04-29 NOTE — Patient Instructions (Signed)
Decrease coumadin to 1 tablet daily except 1/2 tablet on Tuesdays and Fridays Eat extra greens/salads Recheck 1 week

## 2018-05-05 ENCOUNTER — Telehealth (INDEPENDENT_AMBULATORY_CARE_PROVIDER_SITE_OTHER): Payer: PPO | Admitting: Cardiology

## 2018-05-05 DIAGNOSIS — E782 Mixed hyperlipidemia: Secondary | ICD-10-CM | POA: Diagnosis not present

## 2018-05-05 DIAGNOSIS — Z952 Presence of prosthetic heart valve: Secondary | ICD-10-CM | POA: Diagnosis not present

## 2018-05-05 DIAGNOSIS — I4891 Unspecified atrial fibrillation: Secondary | ICD-10-CM

## 2018-05-05 DIAGNOSIS — I5022 Chronic systolic (congestive) heart failure: Secondary | ICD-10-CM | POA: Diagnosis not present

## 2018-05-05 NOTE — Progress Notes (Signed)
Virtual Visit via Telephone Note    Evaluation Performed:  Follow-up visit  This visit type was conducted due to national recommendations for restrictions regarding the COVID-19 Pandemic (e.g. social distancing).  This format is felt to be most appropriate for this patient at this time.  All issues noted in this document were discussed and addressed.  No physical exam was performed (except for noted visual exam findings with Video Visits).  Please refer to the patient's chart (MyChart message for video visits and phone note for telephone visits) for the patient's consent to telehealth for [facility].  Date:  05/05/2018   ID:  Ana Martin, DOB Sep 16, 1955, MRN [id number]  Patient Location:  [place], [place]  Provider location:   [place], [place]  PCP:  Celene Squibb, MD  Cardiologist:  Carlyle Dolly, MD  Electrophysiologist:  Thompson Grayer, MD   Chief Complaint:  3 month follow up appt  History of Present Illness:    Ana Martin is a 63 y.o. female who presents via audio/video conferencing for a telehealth visit today.  Seen today for follow up of the following medical problems.     1. Chronic systolic HF - 05/979 [facility] echo LVEF 30-35% - has BiV ICD Biotronik followed by EP  11/2017 echo LVEF 35-40% - she reports entersto is $250 a month after discusing with her pharmacist  - no recent SOB/DOE. No recent edema - compliant with meds - normal ICD check earlier this month  2. Atrial flutter - history of prior ablation - no palpitations.   - no significant symptoms  3. PAF - notes indicate history of prior av node ablation, pacerdependent.  - has been on coumadin for anticoag and low dose amiodarone(09/2017 TSH 4.1),. - no bleeding on coumadin. Had also been on plavix for several approx 20 years for unlcear etiologybased on initial chart review, we stopped plavix at prior visit.   - no significant palpitaitons.  - compliant with meds  4.  History of mechanical MVR -done in [facility] in 1999.  -on coumadin and ASA in setting of mechanical valve - no recent bleeding  5. COPD - followed by pcp  6. Hyperlipidemia - recent labs with pcp, compliant with statin  7. Depression -  admission 09/2017 - followed by pcp with recent med changes  8. History of TIA -per report, unclear if this may have been when plavix was started. - we previously stopped plavix    SH: her sister is Ana Martin who is also a patient of mine.     The patient does not have symptoms concerning for COVID-19 infection (fever, chills, cough, or new shortness of breath).    Prior CV studies:   The following studies were reviewed today:  08/2015 echo Findings Mitral Valve Mechanical prosthetic valve in mitral position. Aortic Valve The aortic valve leaflets were not well visualized. Tricuspid Valve Tricuspid valve was not well visualized Pulmonic Valve The pulmonic valve was not well visualized Left Atrium Normal left atrium. Left Ventricle Moderately dilated left ventricle. Moderate to severe global LV hypokinesis. Ejection fraction is visually estimated at 30-35% Right Atrium Mild Right atrial enlargement Right Ventricle Mildly dilated right ventricle. Pacer and/or defibrillator wires visualized in right ventricle. Pericardial Effusion No evidence of pericardial effusion.   11/2017 echo w contrast Study Conclusions  - Limited study with echocontrast to evaluate LV function. - Left ventricle: Systolic function was moderately reduced. The estimated ejection fraction was in the range of 35% to  40%. Diffuse hypokinesis.  11/2017 echo Study Conclusions  - Left ventricle: The cavity size was normal. Wall thickness was increased in a pattern of mild LVH. Systolic function was moderately reduced. The estimated ejection fraction was in the range of 35% to 40%. Unable to determine diastolic function in  setting of MVR. - Mitral valve: There is a mechanical valve unknown type or size in the MV position. - Left atrium: The atrium was severely dilated. - Right ventricle: Poorly visualized. TAPSE would suggest mild systolic dysfunction. TAPSE: 14.1 mm . - Technically difficult study.  Impressions:  - LVEF appears to be around 35-40%. Some limitation in the visualization of the endocardium, consider limited study with echocontrast    Past Medical History:  Diagnosis Date  . Anxiety   . Atrial fibrillation ([place])   . Automatic implantable cardiac defibrillator in situ    Lumax DR-T 09-20-2007 Dr.Akbary  . Depression   . Hyperlipidemia   . 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 Hypertension   . Lump or mass in breast    at the 7 o'clock position of right breast(non-tender)  . Other abnormal glucose   . Other left bundle Blakeley Margraf block   . Other primary cardiomyopathies   . Thyroid cancer ([place])   . Thyroid disease   . Unspecified vitamin D deficiency   . Wolff-Parkinson-White syndrome    Past Surgical History:  Procedure Laterality Date  . ABDOMINAL HYSTERECTOMY    . CARDIAC VALVE REPLACEMENT     st jude silzone mitray mechanical 28ms-601 ss# [id number]  . COLONOSCOPY     April 12, 2008  . PACEMAKER PLACEMENT       No outpatient medications have been marked as taking for the 05/05/18 encounter (Appointment) with Arnoldo Lenis, MD.     Allergies:   Codeine   Social History   Tobacco Use  . Smoking status: Former Smoker    Packs/day: 1.00    Years: 25.00    Pack years: 25.00    Types: Cigarettes    Last attempt to quit: 10/17/2009    Years since quitting: 8.5  . Smokeless tobacco: Never Used  Substance Use Topics  . Alcohol use: No  . Drug use: No     Family Hx: The patient's family history includes COPD in her mother; Lung cancer in her father; Sarcoidosis in her mother.  ROS:   Please see the history of present  illness.    All other systems reviewed and are negative.   Labs/Other Tests and Data Reviewed:    Recent Labs: 09/05/2017: TSH 4.139 09/09/2017: Magnesium 2.1 11/23/2017: ALT 17; Hemoglobin 11.7; Platelets 179 12/09/2017: B Natriuretic Peptide 92.0; BUN 14; Creatinine, Ser 0.83; Potassium 4.1; Sodium 137   Recent Lipid Panel No results found for: CHOL, TRIG, HDL, CHOLHDL, LDLCALC, LDLDIRECT  Wt Readings from Last 3 Encounters:  04/09/18 209 lb (94.8 kg)  01/07/18 205 lb 6.4 oz (93.2 kg)  12/16/17 200 lb 3.2 oz (90.8 kg)     Objective:    Vital Signs:  There were no vitals taken for this visit.   Normal affect, normal speech patterns. Sounds comfortable in no distres  ASSESSMENT & PLAN:    1. Chronic systolic HF - no recent symptoms - entresto too expensive, remains on ARB - continue current meds. ICD followed by EP with recent normla check but will require gen change soon - no recent symptoms.   2. Mechanical MVR - she is  on coumadin and ASA in setting of mechanical mitral valve.  No symptoms continue to monitor.     3. PAF - history of av nodal ablation, pacer dependent. Has been on amio - no symptpoms, continue to follow with EP  4. Hyperlipidemia - request labs from pcp, continue statin    COVID-19 Education: The signs and symptoms of COVID-19 were discussed with the patient and how to seek care for testing (follow up with PCP or arrange E-visit).  The importance of social distancing was discussed today.  Patient Risk:   After full review of this patient's clinical status, I feel that they are at least moderate risk at this time.  Time:   Today, I have spent 18 minutes with the patient with telehealth technology discussing the above medical problems.     Medication Adjustments/Labs and Tests Ordered: Current medicines are reviewed at length with the patient today.  Concerns regarding medicines are outlined above.  Tests Ordered: No orders of the  defined types were placed in this encounter.  Medication Changes: No orders of the defined types were placed in this encounter.   Disposition:  Follow up 4 months  Signed, Carlyle Dolly, MD  05/05/2018 8:44 AM    [facility]

## 2018-05-05 NOTE — Progress Notes (Signed)
Medication Instructions:  Your physician recommends that you continue on your current medications as directed. Please refer to the Current Medication list given to you today.   Labwork: I will request labs from pcp   Testing/Procedures: none  Follow-Up: Your physician recommends that you schedule a follow-up appointment in: 4 months   Any Other Special Instructions Will Be Listed Below (If Applicable).     If you need a refill on your cardiac medications before your next appointment, please call your pharmacy.   

## 2018-05-06 ENCOUNTER — Ambulatory Visit (INDEPENDENT_AMBULATORY_CARE_PROVIDER_SITE_OTHER): Payer: PPO | Admitting: *Deleted

## 2018-05-06 ENCOUNTER — Other Ambulatory Visit: Payer: Self-pay

## 2018-05-06 DIAGNOSIS — M4326 Fusion of spine, lumbar region: Secondary | ICD-10-CM | POA: Diagnosis not present

## 2018-05-06 DIAGNOSIS — Z5181 Encounter for therapeutic drug level monitoring: Secondary | ICD-10-CM

## 2018-05-06 DIAGNOSIS — Z952 Presence of prosthetic heart valve: Secondary | ICD-10-CM | POA: Diagnosis not present

## 2018-05-06 DIAGNOSIS — I4891 Unspecified atrial fibrillation: Secondary | ICD-10-CM | POA: Diagnosis not present

## 2018-05-06 LAB — POCT INR: INR: 1.8 — AB (ref 2.0–3.0)

## 2018-05-06 NOTE — Patient Instructions (Signed)
Take coumadin 1 1/2 tablets tonight then increase dose to 1 tablet daily except 1/2 tablet on Tuesdays Recheck 1 week

## 2018-05-12 ENCOUNTER — Telehealth: Payer: Self-pay | Admitting: Cardiology

## 2018-05-12 NOTE — Telephone Encounter (Signed)
° ° ° °  COVID-19 Pre-Screening Questions: ° °• Do you currently have a fever? No °•  °• Have you recently travelled on a cruise, internationally, or to [place], [place], [place], [place], [place], or [place], [place] ([place]) ? No °•  °• Have you been in contact with someone that is currently pending confirmation of Covid19 testing or has been confirmed to have the Covid19 virus? No °•  °• Are you currently experiencing fatigue or cough? No  ° ° °   ° ° ° ° ° °

## 2018-05-13 ENCOUNTER — Ambulatory Visit (INDEPENDENT_AMBULATORY_CARE_PROVIDER_SITE_OTHER): Payer: PPO | Admitting: *Deleted

## 2018-05-13 DIAGNOSIS — Z952 Presence of prosthetic heart valve: Secondary | ICD-10-CM

## 2018-05-13 DIAGNOSIS — I4891 Unspecified atrial fibrillation: Secondary | ICD-10-CM

## 2018-05-13 DIAGNOSIS — Z5181 Encounter for therapeutic drug level monitoring: Secondary | ICD-10-CM | POA: Diagnosis not present

## 2018-05-13 LAB — POCT INR: INR: 3.2 — AB (ref 2.0–3.0)

## 2018-05-13 MED ORDER — WARFARIN SODIUM 2.5 MG PO TABS: ORAL_TABLET | ORAL | 1 refills | Status: DC

## 2018-05-13 NOTE — Patient Instructions (Signed)
Decrease coumadin to 1 tablet daily except 1/2 tablet on Tuesdays and Fridays Recheck 10 days

## 2018-05-17 ENCOUNTER — Other Ambulatory Visit: Payer: Self-pay

## 2018-05-17 ENCOUNTER — Ambulatory Visit (INDEPENDENT_AMBULATORY_CARE_PROVIDER_SITE_OTHER): Payer: PPO | Admitting: *Deleted

## 2018-05-17 DIAGNOSIS — I428 Other cardiomyopathies: Secondary | ICD-10-CM | POA: Diagnosis not present

## 2018-05-18 LAB — CUP PACEART REMOTE DEVICE CHECK
Date Time Interrogation Session: 20200414084207
Implantable Lead Implant Date: 20090817
Implantable Lead Implant Date: 20090817
Implantable Lead Implant Date: 20130326
Implantable Lead Location: 753858
Implantable Lead Location: 753859
Implantable Lead Location: 753860
Implantable Lead Model: 350
Implantable Lead Model: 350
Implantable Lead Model: 511212
Implantable Lead Serial Number: 10331293
Implantable Lead Serial Number: 195045
Implantable Lead Serial Number: 24873266
Implantable Pulse Generator Implant Date: 20130326
Pulse Gen Serial Number: 60669809

## 2018-05-21 DIAGNOSIS — Z0000 Encounter for general adult medical examination without abnormal findings: Secondary | ICD-10-CM | POA: Diagnosis not present

## 2018-05-24 ENCOUNTER — Ambulatory Visit (INDEPENDENT_AMBULATORY_CARE_PROVIDER_SITE_OTHER): Payer: PPO | Admitting: *Deleted

## 2018-05-24 ENCOUNTER — Other Ambulatory Visit: Payer: Self-pay

## 2018-05-24 DIAGNOSIS — Z952 Presence of prosthetic heart valve: Secondary | ICD-10-CM | POA: Diagnosis not present

## 2018-05-24 DIAGNOSIS — I4891 Unspecified atrial fibrillation: Secondary | ICD-10-CM | POA: Diagnosis not present

## 2018-05-24 DIAGNOSIS — Z5181 Encounter for therapeutic drug level monitoring: Secondary | ICD-10-CM | POA: Diagnosis not present

## 2018-05-24 LAB — POCT INR: INR: 2.1 (ref 2.0–3.0)

## 2018-05-24 NOTE — Patient Instructions (Signed)
Increase coumadin to 1 tablet daily except 1/2 tablet on Fridays Recheck in 3 wks

## 2018-05-28 ENCOUNTER — Encounter: Payer: Self-pay | Admitting: Cardiology

## 2018-05-28 NOTE — Progress Notes (Signed)
Remote ICD transmission.   

## 2018-06-03 ENCOUNTER — Telehealth: Payer: Self-pay | Admitting: *Deleted

## 2018-06-03 NOTE — Telephone Encounter (Signed)
Spoke with patient to advise of ICD at [facility] as of 06/02/18. Per OV note from last appointment with Dr. Rayann Heman on 04/09/18, ok to schedule generator change if ERI is reached before next OV on 07/16/18. Explained ~3 month window to schedule gen change once ERI is triggered. Advised I will send this message to Angelica, [place], to schedule procedure when possible. Advised she will likely contact patient next week at the earliest. Pt verbalizes understanding of all information and thanked me for my call.

## 2018-06-04 DIAGNOSIS — F332 Major depressive disorder, recurrent severe without psychotic features: Secondary | ICD-10-CM | POA: Diagnosis not present

## 2018-06-04 DIAGNOSIS — G4700 Insomnia, unspecified: Secondary | ICD-10-CM | POA: Diagnosis not present

## 2018-06-07 ENCOUNTER — Other Ambulatory Visit: Payer: Self-pay | Admitting: Cardiology

## 2018-06-10 NOTE — Telephone Encounter (Signed)
Left message for patient advising I had her on my list of patients needing procedures.  Advised I would call her as soon as I could schedule procedure.

## 2018-06-15 ENCOUNTER — Other Ambulatory Visit: Payer: Self-pay

## 2018-06-15 ENCOUNTER — Ambulatory Visit (INDEPENDENT_AMBULATORY_CARE_PROVIDER_SITE_OTHER): Payer: PPO | Admitting: *Deleted

## 2018-06-15 DIAGNOSIS — I4891 Unspecified atrial fibrillation: Secondary | ICD-10-CM | POA: Diagnosis not present

## 2018-06-15 DIAGNOSIS — Z5181 Encounter for therapeutic drug level monitoring: Secondary | ICD-10-CM

## 2018-06-15 DIAGNOSIS — Z952 Presence of prosthetic heart valve: Secondary | ICD-10-CM

## 2018-06-15 LAB — POCT INR: INR: 3.6 — AB (ref 2.0–3.0)

## 2018-06-15 NOTE — Patient Instructions (Signed)
Take coumadin 1/2 tablet tonight then resume 1 tablet daily except 1/2 tablet on Fridays Recheck in 3 wks

## 2018-07-13 ENCOUNTER — Telehealth: Payer: Self-pay | Admitting: *Deleted

## 2018-07-13 NOTE — Telephone Encounter (Signed)

## 2018-07-14 ENCOUNTER — Telehealth: Payer: Self-pay | Admitting: *Deleted

## 2018-07-14 ENCOUNTER — Telehealth: Payer: Self-pay

## 2018-07-14 ENCOUNTER — Ambulatory Visit (INDEPENDENT_AMBULATORY_CARE_PROVIDER_SITE_OTHER): Payer: PPO | Admitting: *Deleted

## 2018-07-14 DIAGNOSIS — Z5181 Encounter for therapeutic drug level monitoring: Secondary | ICD-10-CM

## 2018-07-14 DIAGNOSIS — Z952 Presence of prosthetic heart valve: Secondary | ICD-10-CM | POA: Diagnosis not present

## 2018-07-14 DIAGNOSIS — I4891 Unspecified atrial fibrillation: Secondary | ICD-10-CM

## 2018-07-14 LAB — POCT INR: INR: 2 (ref 2.0–3.0)

## 2018-07-14 NOTE — Telephone Encounter (Signed)
Reviewed pt medications/allergies for 07/16/18 appt with Dr Rayann Heman - pt doesn't have a way to check HR/BP at home.

## 2018-07-14 NOTE — Patient Instructions (Signed)
Take coumadin 1 1/2 tablets tonight and tomorrow night then resume 1 tablet daily except 1/2 tablet on Fridays Recheck in 3 wks

## 2018-07-16 ENCOUNTER — Telehealth (INDEPENDENT_AMBULATORY_CARE_PROVIDER_SITE_OTHER): Payer: PPO | Admitting: Internal Medicine

## 2018-07-16 DIAGNOSIS — I428 Other cardiomyopathies: Secondary | ICD-10-CM

## 2018-07-16 DIAGNOSIS — I4819 Other persistent atrial fibrillation: Secondary | ICD-10-CM | POA: Diagnosis not present

## 2018-07-16 DIAGNOSIS — I519 Heart disease, unspecified: Secondary | ICD-10-CM

## 2018-07-16 NOTE — Progress Notes (Signed)
Electrophysiology TeleHealth Note   Due to national recommendations of social distancing due to [name], an audio telehealth visit is felt to be most appropriate for this patient at this time.  Verbal consent was obtained by me for the telehealth visit today.  The patient does not have capability for a virtual visit.  A phone visit is therefore required today.   Date:  07/16/2018   ID:  Ana [place], DOB 12-08-1955, MRN [id number]  Location: patient's home  Provider location:  [facility]  Evaluation Performed: Follow-up visit  PCP:  Celene Squibb, MD   Electrophysiologist:  Dr Rayann Heman  Chief Complaint:  ICD at [facility]  History of Present Illness:    Ana Martin is a 63 y.o. female who presents via telehealth conferencing today.  Since last being seen in our clinic, the patient reports doing very well.  Today, she denies symptoms of palpitations, chest pain, shortness of breath,  lower extremity edema, dizziness, presyncope, or syncope.  The patient is otherwise without complaint today.  The patient denies symptoms of fevers, chills, cough, or new SOB worrisome for COVID 19.  Past Medical History:  Diagnosis Date   Anxiety    Atrial fibrillation ([place])    Automatic implantable cardiac defibrillator in situ    Lumax DR-T 09-20-2007 Dr.Akbary   Depression    Hyperlipidemia    Hypersomnia, unspecified    related to known Axis III factor   Hypertension    Lump or mass in breast    at the 7 o'clock position of right breast(non-tender)   Other abnormal glucose    Other left bundle branch block    Other primary cardiomyopathies    Thyroid cancer ([place])    Thyroid disease    Unspecified vitamin D deficiency    Wolff-Parkinson-White syndrome     Past Surgical History:  Procedure Laterality Date   ABDOMINAL HYSTERECTOMY     CARDIAC VALVE REPLACEMENT     st jude silzone mitray mechanical [id number]-601 ss# [id number]   COLONOSCOPY     April 12, 2008    PACEMAKER PLACEMENT      Current Outpatient Medications  Medication Sig Dispense Refill   albuterol (PROAIR HFA) 108 (90 Base) MCG/ACT inhaler Inhale 1 puff into the lungs every 4 (four) hours as needed.     ALPRAZolam (XANAX) 0.5 MG tablet Take 0.5 mg by mouth daily as needed. for anxiety  0   amiodarone (PACERONE) 200 MG tablet Take 1 tablet (200 mg total) by mouth daily. 30 tablet 0   aspirin EC 81 MG tablet Take 81 mg by mouth daily.     atorvastatin (LIPITOR) 40 MG tablet Take 40 mg by mouth every evening. for cholesterol  0   budesonide-formoterol (SYMBICORT) 160-4.5 MCG/ACT inhaler Inhale 2 puffs into the lungs 2 (two) times daily.     carvedilol (COREG) 12.5 MG tablet Take 12.5 mg by mouth 2 (two) times daily with a meal.     furosemide (LASIX) 40 MG tablet Take 1 tablet (40 mg total) by mouth daily. 30 tablet 0   guaiFENesin (MUCINEX) 600 MG 12 hr tablet Take 2 tablets (1,200 mg total) by mouth 2 (two) times daily. 10 tablet 0   levothyroxine (SYNTHROID, LEVOTHROID) 200 MCG tablet Take 1 tablet (200 mcg total) by mouth daily before breakfast. (Patient taking differently: Take 175 mcg by mouth daily before breakfast. ) 30 tablet 0   losartan (COZAAR) 25 MG tablet Take 1 tablet by mouth  daily.  4   Melatonin 5 MG TABS Take 5-10 mg by mouth at bedtime.     metFORMIN (GLUCOPHAGE) 500 MG tablet Take by mouth daily with breakfast.     spironolactone (ALDACTONE) 25 MG tablet TAKE 1/2 TABLET BY MOUTH DAILY 45 tablet 1   venlafaxine XR (EFFEXOR XR) 75 MG 24 hr capsule Take 1 capsule (75 mg total) by mouth daily with breakfast. 30 capsule 0   warfarin (COUMADIN) 2.5 MG tablet Take 1 tablet daily except 1/2 tablet on Tuesdays and Fridays or as directed 90 tablet 1   No current facility-administered medications for this visit.     Allergies:   Codeine   Social History:  The patient  reports that she quit smoking about 8 years ago. Her smoking use included cigarettes. She has a  25.00 pack-year smoking history. She has never used smokeless tobacco. She reports that she does not drink alcohol or use drugs.   Family History:  The patient's  family history includes COPD in her mother; Lung cancer in her father; Sarcoidosis in her mother.   ROS:  Please see the history of present illness.   All other systems are personally reviewed and negative.    Exam:    Vital Signs:  There were no vitals taken for this visit.  Well sounding today   Labs/Other Tests and Data Reviewed:    Recent Labs: 09/05/2017: TSH 4.139 09/09/2017: Magnesium 2.1 11/23/2017: ALT 17; Hemoglobin 11.7; Platelets 179 12/09/2017: B Natriuretic Peptide 92.0; BUN 14; Creatinine, Ser 0.83; Potassium 4.1; Sodium 137   Wt Readings from Last 3 Encounters:  04/09/18 209 lb (94.8 kg)  01/07/18 205 lb 6.4 oz (93.2 kg)  12/16/17 200 lb 3.2 oz (90.8 kg)     Last device remote is reviewed from [place] PDF which reveals normal device function, no arrhythmias, device at ERI   ASSESSMENT & PLAN:    1.  Chronic systolic heart failure/NICM Stable  ICD at ERI - risks, benefits to gen change reviewed with patient who wishes to proceed. Will schedule at next available time. Will plan for Medtronic Bluetooth enabled CRTD Will need INR at time of COVID testing  2.  Persistent AF/atypical flutter S/p AVN ablation Continue Warfarin Predominately maintaining SR by last device interrogation  3.  Mechanical MVR Continue Warfarin   Follow-up:  After gen change    Patient Risk:  after full review of this patients clinical status, I feel that they are at moderate risk at this time.  Today, I have spent 15 minutes with the patient with telehealth technology discussing arrhythmia management .    Army Fossa, MD  07/16/2018 9:12 AM     [facility] [address] [place] [address] [phone] (office) [phone] (fax)

## 2018-07-16 NOTE — H&P (View-Only) (Signed)
Electrophysiology TeleHealth Note   Due to national recommendations of social distancing due to [place] 19, an audio telehealth visit is felt to be most appropriate for this patient at this time.  Verbal consent was obtained by me for the telehealth visit today.  The patient does not have capability for a virtual visit.  A phone visit is therefore required today.   Date:  07/16/2018   ID:  Ana Martin, DOB [birth year], MRN [id number]  Location: patient's home  Provider location:  [facility]  Evaluation Performed: Follow-up visit  PCP:  Celene Squibb, MD   Electrophysiologist:  Dr Rayann Heman  Chief Complaint:  ICD at [facility]  History of Present Illness:    Ana Martin is a 63 y.o. female who presents via telehealth conferencing today.  Since last being seen in our clinic, the patient reports doing very well.  Today, she denies symptoms of palpitations, chest pain, shortness of breath,  lower extremity edema, dizziness, presyncope, or syncope.  The patient is otherwise without complaint today.  The patient denies symptoms of fevers, chills, cough, or new SOB worrisome for COVID 19.  Past Medical History:  Diagnosis Date   Anxiety    Atrial fibrillation ([place])    Automatic implantable cardiac defibrillator in situ    Lumax DR-T 09-20-2007 Dr.Akbary   Depression    Hyperlipidemia    Hypersomnia, unspecified    related to known Axis III factor   Hypertension    Lump or mass in breast    at the 7 o'clock position of right breast(non-tender)   Other abnormal glucose    Other left bundle branch block    Other primary cardiomyopathies    Thyroid cancer ([place])    Thyroid disease    Unspecified vitamin D deficiency    Wolff-Parkinson-White syndrome     Past Surgical History:  Procedure Laterality Date   ABDOMINAL HYSTERECTOMY     CARDIAC VALVE REPLACEMENT     st jude silzone mitray mechanical 37ms-601 ss# [id number]   COLONOSCOPY     April 12, 2008    PACEMAKER PLACEMENT      Current Outpatient Medications  Medication Sig Dispense Refill   albuterol (PROAIR HFA) 108 (90 Base) MCG/ACT inhaler Inhale 1 puff into the lungs every 4 (four) hours as needed.     ALPRAZolam (XANAX) 0.5 MG tablet Take 0.5 mg by mouth daily as needed. for anxiety  0   amiodarone (PACERONE) 200 MG tablet Take 1 tablet (200 mg total) by mouth daily. 30 tablet 0   aspirin EC 81 MG tablet Take 81 mg by mouth daily.     atorvastatin (LIPITOR) 40 MG tablet Take 40 mg by mouth every evening. for cholesterol  0   budesonide-formoterol (SYMBICORT) 160-4.5 MCG/ACT inhaler Inhale 2 puffs into the lungs 2 (two) times daily.     carvedilol (COREG) 12.5 MG tablet Take 12.5 mg by mouth 2 (two) times daily with a meal.     furosemide (LASIX) 40 MG tablet Take 1 tablet (40 mg total) by mouth daily. 30 tablet 0   guaiFENesin (MUCINEX) 600 MG 12 hr tablet Take 2 tablets (1,200 mg total) by mouth 2 (two) times daily. 10 tablet 0   levothyroxine (SYNTHROID, LEVOTHROID) 200 MCG tablet Take 1 tablet (200 mcg total) by mouth daily before breakfast. (Patient taking differently: Take 175 mcg by mouth daily before breakfast. ) 30 tablet 0   losartan (COZAAR) 25 MG tablet Take 1 tablet by mouth  daily.  4   Melatonin 5 MG TABS Take 5-10 mg by mouth at bedtime.     metFORMIN (GLUCOPHAGE) 500 MG tablet Take by mouth daily with breakfast.     spironolactone (ALDACTONE) 25 MG tablet TAKE 1/2 TABLET BY MOUTH DAILY 45 tablet 1   venlafaxine XR (EFFEXOR XR) 75 MG 24 hr capsule Take 1 capsule (75 mg total) by mouth daily with breakfast. 30 capsule 0   warfarin (COUMADIN) 2.5 MG tablet Take 1 tablet daily except 1/2 tablet on Tuesdays and Fridays or as directed 90 tablet 1   No current facility-administered medications for this visit.     Allergies:   Codeine   Social History:  The patient  reports that she quit smoking about 8 years ago. Her smoking use included cigarettes. She has a  25.00 pack-year smoking history. She has never used smokeless tobacco. She reports that she does not drink alcohol or use drugs.   Family History:  The patient's  family history includes COPD in her mother; Lung cancer in her father; Sarcoidosis in her mother.   ROS:  Please see the history of present illness.   All other systems are personally reviewed and negative.    Exam:    Vital Signs:  There were no vitals taken for this visit.  Well sounding today   Labs/Other Tests and Data Reviewed:    Recent Labs: 09/05/2017: TSH 4.139 09/09/2017: Magnesium 2.1 11/23/2017: ALT 17; Hemoglobin 11.7; Platelets 179 12/09/2017: B Natriuretic Peptide 92.0; BUN 14; Creatinine, Ser 0.83; Potassium 4.1; Sodium 137   Wt Readings from Last 3 Encounters:  04/09/18 209 lb (94.8 kg)  01/07/18 205 lb 6.4 oz (93.2 kg)  12/16/17 200 lb 3.2 oz (90.8 kg)     Last device remote is reviewed from [place] PDF which reveals normal device function, no arrhythmias, device at ERI   ASSESSMENT & PLAN:    1.  Chronic systolic heart failure/NICM Stable  ICD at ERI - risks, benefits to gen change reviewed with patient who wishes to proceed. Will schedule at next available time. Will plan for Medtronic Bluetooth enabled CRTD Will need INR at time of COVID testing  2.  Persistent AF/atypical flutter S/p AVN ablation Continue Warfarin Predominately maintaining SR by last device interrogation  3.  Mechanical MVR Continue Warfarin   Follow-up:  After gen change    Patient Risk:  after full review of this patients clinical status, I feel that they are at moderate risk at this time.  Today, I have spent 15 minutes with the patient with telehealth technology discussing arrhythmia management .    Army Fossa, MD  07/16/2018 9:12 AM     [facility] [address] [place] [address] [phone] (office) [phone] (fax)

## 2018-07-20 ENCOUNTER — Telehealth: Payer: Self-pay

## 2018-07-20 DIAGNOSIS — Z952 Presence of prosthetic heart valve: Secondary | ICD-10-CM

## 2018-07-20 DIAGNOSIS — I428 Other cardiomyopathies: Secondary | ICD-10-CM

## 2018-07-20 DIAGNOSIS — Z95810 Presence of automatic (implantable) cardiac defibrillator: Secondary | ICD-10-CM

## 2018-07-20 NOTE — Telephone Encounter (Signed)
Left message for Pt to return this nurse call to schedule gen change  Advised at this time June 23 and June 25 available

## 2018-07-21 NOTE — Telephone Encounter (Signed)
Call placed to Pt.  Pt scheduled for gen change on June 25 at 1:00 pm  Will pick up instruction letter/soap and lab orders at [place] office on Monday June 22.  Then she will get labs and covid test at [facility].  Advised Pt to continue warfarin as ordered unless she received additional advisement from this nurse post STAT INR on June 22  Pt indicates understanding of all direction.

## 2018-07-22 ENCOUNTER — Telehealth: Payer: Self-pay | Admitting: Internal Medicine

## 2018-07-22 NOTE — Telephone Encounter (Signed)
New Message:     Pt wants to know how long will it take for her procedure on 07-29-18 at the hosptal?

## 2018-07-22 NOTE — Telephone Encounter (Signed)
Advised procedure is slotted for 2 hours, but then Pt must recover prior to discharge.  Pt thanked nurse for call.

## 2018-07-26 ENCOUNTER — Other Ambulatory Visit (HOSPITAL_COMMUNITY)
Admission: RE | Admit: 2018-07-26 | Discharge: 2018-07-26 | Disposition: A | Discharge status: Home / Self Care | Payer: PPO | Source: Ambulatory Visit | Attending: Internal Medicine | Admitting: Internal Medicine

## 2018-07-26 ENCOUNTER — Telehealth: Payer: Self-pay

## 2018-07-26 ENCOUNTER — Other Ambulatory Visit: Payer: Self-pay

## 2018-07-26 DIAGNOSIS — Z1159 Encounter for screening for other viral diseases: Secondary | ICD-10-CM | POA: Insufficient documentation

## 2018-07-26 DIAGNOSIS — Z95810 Presence of automatic (implantable) cardiac defibrillator: Secondary | ICD-10-CM | POA: Diagnosis not present

## 2018-07-26 DIAGNOSIS — Z952 Presence of prosthetic heart valve: Secondary | ICD-10-CM | POA: Diagnosis not present

## 2018-07-26 DIAGNOSIS — I428 Other cardiomyopathies: Secondary | ICD-10-CM | POA: Insufficient documentation

## 2018-07-26 LAB — BASIC METABOLIC PANEL
Anion gap: 10 (ref 5–15)
BUN: 19 mg/dL (ref 8–23)
CO2: 29 mmol/L (ref 22–32)
Calcium: 8.7 mg/dL — ABNORMAL LOW (ref 8.9–10.3)
Chloride: 96 mmol/L — ABNORMAL LOW (ref 98–111)
Creatinine, Ser: 1.08 mg/dL — ABNORMAL HIGH (ref 0.44–1.00)
GFR calc Af Amer: >60 mL/min (ref >60)
GFR calc non Af Amer: 54 mL/min — ABNORMAL LOW (ref >60)
Glucose, Bld: 128 mg/dL — ABNORMAL HIGH (ref 70–99)
Potassium: 4.1 mmol/L (ref 3.5–5.1)
Sodium: 135 mmol/L (ref 135–145)

## 2018-07-26 LAB — CBC WITH DIFFERENTIAL/PLATELET
Abs Immature Granulocytes: 0.04 10*3/uL (ref 0.00–0.07)
Basophils Absolute: 0.1 10*3/uL (ref 0.0–0.1)
Basophils Relative: 1 %
Eosinophils Absolute: 0.2 10*3/uL (ref 0.0–0.5)
Eosinophils Relative: 2 %
HCT: 43.3 % (ref 36.0–46.0)
Hemoglobin: 12.6 g/dL (ref 12.0–15.0)
Immature Granulocytes: 0 %
Lymphocytes Relative: 16 %
Lymphs Abs: 1.5 10*3/uL (ref 0.7–4.0)
MCH: 26.1 pg (ref 26.0–34.0)
MCHC: 29.1 g/dL — ABNORMAL LOW (ref 30.0–36.0)
MCV: 89.6 fL (ref 80.0–100.0)
Monocytes Absolute: 0.5 10*3/uL (ref 0.1–1.0)
Monocytes Relative: 5 %
Neutro Abs: 6.9 10*3/uL (ref 1.7–7.7)
Neutrophils Relative %: 76 %
Platelets: 157 10*3/uL (ref 150–400)
RBC: 4.83 MIL/uL (ref 3.87–5.11)
RDW: 18.9 % — ABNORMAL HIGH (ref 11.5–15.5)
WBC: 9.2 10*3/uL (ref 4.0–10.5)
nRBC: 0 % (ref 0.0–0.2)

## 2018-07-26 LAB — PROTIME-INR
INR: 2 — ABNORMAL HIGH (ref 0.8–1.2)
Prothrombin Time: 22.8 seconds — ABNORMAL HIGH (ref 11.4–15.2)

## 2018-07-26 NOTE — Telephone Encounter (Signed)
Left detailed message per DPR.  Advised Pt to continue to take warfarin as ordered per Dr. Rayann Heman.  She should NOT hold her warfarin prior to the procedure

## 2018-07-27 LAB — NOVEL CORONAVIRUS, NAA (HOSP ORDER, SEND-OUT TO REF LAB; TAT 18-24 HRS): SARS-CoV-2, NAA: NOT DETECTED

## 2018-07-29 ENCOUNTER — Ambulatory Visit (HOSPITAL_COMMUNITY)
Admission: RE | Admit: 2018-07-29 | Discharge: 2018-07-29 | Disposition: A | Discharge status: Home / Self Care | Payer: PPO | Attending: Internal Medicine | Admitting: Internal Medicine

## 2018-07-29 ENCOUNTER — Encounter (HOSPITAL_COMMUNITY)
Admission: RE | Disposition: A | Discharge status: Home / Self Care | Payer: Self-pay | Source: Home / Self Care | Attending: Internal Medicine

## 2018-07-29 ENCOUNTER — Other Ambulatory Visit: Payer: Self-pay

## 2018-07-29 ENCOUNTER — Encounter (HOSPITAL_COMMUNITY): Payer: Self-pay | Admitting: Internal Medicine

## 2018-07-29 DIAGNOSIS — Z7984 Long term (current) use of oral hypoglycemic drugs: Secondary | ICD-10-CM | POA: Insufficient documentation

## 2018-07-29 DIAGNOSIS — Z952 Presence of prosthetic heart valve: Secondary | ICD-10-CM | POA: Insufficient documentation

## 2018-07-29 DIAGNOSIS — I428 Other cardiomyopathies: Secondary | ICD-10-CM | POA: Diagnosis not present

## 2018-07-29 DIAGNOSIS — I5022 Chronic systolic (congestive) heart failure: Secondary | ICD-10-CM | POA: Diagnosis not present

## 2018-07-29 DIAGNOSIS — Z7951 Long term (current) use of inhaled steroids: Secondary | ICD-10-CM | POA: Diagnosis not present

## 2018-07-29 DIAGNOSIS — I442 Atrioventricular block, complete: Secondary | ICD-10-CM | POA: Diagnosis not present

## 2018-07-29 DIAGNOSIS — Z79899 Other long term (current) drug therapy: Secondary | ICD-10-CM | POA: Insufficient documentation

## 2018-07-29 DIAGNOSIS — Z885 Allergy status to narcotic agent status: Secondary | ICD-10-CM | POA: Insufficient documentation

## 2018-07-29 DIAGNOSIS — I4819 Other persistent atrial fibrillation: Secondary | ICD-10-CM | POA: Diagnosis not present

## 2018-07-29 DIAGNOSIS — Z85850 Personal history of malignant neoplasm of thyroid: Secondary | ICD-10-CM | POA: Insufficient documentation

## 2018-07-29 DIAGNOSIS — Z7901 Long term (current) use of anticoagulants: Secondary | ICD-10-CM | POA: Insufficient documentation

## 2018-07-29 DIAGNOSIS — Z90710 Acquired absence of both cervix and uterus: Secondary | ICD-10-CM | POA: Diagnosis not present

## 2018-07-29 DIAGNOSIS — I110 Hypertensive heart disease with heart failure: Secondary | ICD-10-CM | POA: Diagnosis not present

## 2018-07-29 DIAGNOSIS — I456 Pre-excitation syndrome: Secondary | ICD-10-CM | POA: Diagnosis not present

## 2018-07-29 DIAGNOSIS — Z006 Encounter for examination for normal comparison and control in clinical research program: Secondary | ICD-10-CM | POA: Diagnosis not present

## 2018-07-29 DIAGNOSIS — Z79890 Hormone replacement therapy (postmenopausal): Secondary | ICD-10-CM | POA: Diagnosis not present

## 2018-07-29 DIAGNOSIS — E079 Disorder of thyroid, unspecified: Secondary | ICD-10-CM | POA: Insufficient documentation

## 2018-07-29 DIAGNOSIS — Z87891 Personal history of nicotine dependence: Secondary | ICD-10-CM | POA: Insufficient documentation

## 2018-07-29 DIAGNOSIS — Z7982 Long term (current) use of aspirin: Secondary | ICD-10-CM | POA: Diagnosis not present

## 2018-07-29 DIAGNOSIS — E785 Hyperlipidemia, unspecified: Secondary | ICD-10-CM | POA: Diagnosis not present

## 2018-07-29 DIAGNOSIS — Z4502 Encounter for adjustment and management of automatic implantable cardiac defibrillator: Secondary | ICD-10-CM | POA: Insufficient documentation

## 2018-07-29 HISTORY — PX: BIV ICD GENERATOR CHANGEOUT: EP1194

## 2018-07-29 LAB — SURGICAL PCR SCREEN
MRSA, PCR: NEGATIVE
Staphylococcus aureus: NEGATIVE

## 2018-07-29 LAB — PROTIME-INR
INR: 2.1 — ABNORMAL HIGH (ref 0.8–1.2)
Prothrombin Time: 23.2 seconds — ABNORMAL HIGH (ref 11.4–15.2)

## 2018-07-29 SURGERY — BIV ICD GENERATOR CHANGEOUT

## 2018-07-29 MED ORDER — MIDAZOLAM HCL 5 MG/5ML IJ SOLN
INTRAMUSCULAR | Status: DC | PRN
  Administered 2018-07-29: 1 mg via INTRAVENOUS

## 2018-07-29 MED ORDER — MUPIROCIN 2 % EX OINT
TOPICAL_OINTMENT | CUTANEOUS | Status: AC
  Administered 2018-07-29: 1 via TOPICAL
  Filled 2018-07-29: qty 22

## 2018-07-29 MED ORDER — CEFAZOLIN SODIUM-DEXTROSE 2-4 GM/100ML-% IV SOLN
2.0000 g | INTRAVENOUS | Status: AC
  Administered 2018-07-29: 15:00:00 2 g via INTRAVENOUS

## 2018-07-29 MED ORDER — FENTANYL CITRATE (PF) 100 MCG/2ML IJ SOLN
INTRAMUSCULAR | Status: AC
  Filled 2018-07-29: qty 2

## 2018-07-29 MED ORDER — SODIUM CHLORIDE 0.9 % IV SOLN
INTRAVENOUS | Status: AC
  Filled 2018-07-29: qty 2

## 2018-07-29 MED ORDER — MIDAZOLAM HCL 5 MG/5ML IJ SOLN
INTRAMUSCULAR | Status: AC
  Filled 2018-07-29: qty 5

## 2018-07-29 MED ORDER — SODIUM CHLORIDE 0.9 % IV SOLN
80.0000 mg | INTRAVENOUS | Status: AC
  Administered 2018-07-29: 16:00:00 80 mg
  Filled 2018-07-29 (×2): qty 2

## 2018-07-29 MED ORDER — LIDOCAINE HCL (PF) 1 % IJ SOLN
INTRAMUSCULAR | Status: AC
  Filled 2018-07-29: qty 60

## 2018-07-29 MED ORDER — FENTANYL CITRATE (PF) 100 MCG/2ML IJ SOLN
INTRAMUSCULAR | Status: DC | PRN
  Administered 2018-07-29: 12.5 ug via INTRAVENOUS

## 2018-07-29 MED ORDER — CEFAZOLIN SODIUM-DEXTROSE 2-4 GM/100ML-% IV SOLN
INTRAVENOUS | Status: AC
  Filled 2018-07-29: qty 100

## 2018-07-29 MED ORDER — LIDOCAINE HCL (PF) 1 % IJ SOLN
INTRAMUSCULAR | Status: DC | PRN
  Administered 2018-07-29: 50 mL

## 2018-07-29 MED ORDER — SODIUM CHLORIDE 0.9 % IV SOLN
INTRAVENOUS | Status: DC
  Administered 2018-07-29: 12:00:00 via INTRAVENOUS

## 2018-07-29 MED ORDER — CHLORHEXIDINE GLUCONATE 4 % EX LIQD: 60.0000 mL | Freq: Once | CUTANEOUS | Status: DC

## 2018-07-29 MED ORDER — MUPIROCIN 2 % EX OINT
1.0000 "application " | TOPICAL_OINTMENT | Freq: Once | CUTANEOUS | Status: AC
  Administered 2018-07-29: 12:00:00 1 via TOPICAL

## 2018-07-29 MED ORDER — SODIUM CHLORIDE 0.9% FLUSH: 3.0000 mL | Freq: Two times a day (BID) | INTRAVENOUS | Status: DC

## 2018-07-29 SURGICAL SUPPLY — 4 items
CABLE SURGICAL S-101-97-12 (CABLE) ×3 IMPLANT
ICD COBALT CRTD DTPB2D1 (ICD Generator) ×2 IMPLANT
PAD PRO RADIOLUCENT 2001M-C (PAD) ×3 IMPLANT
TRAY PACEMAKER INSERTION (PACKS) ×3 IMPLANT

## 2018-07-29 NOTE — Discharge Instructions (Signed)
Pacemaker Battery Change, Care After  This sheet gives you information about how to care for yourself after your procedure. Your health care provider may also give you more specific instructions. If you have problems or questions, contact your health care provider.  What can I expect after the procedure?  After your procedure, it is common to have:   Pain or soreness at the site where the pacemaker was inserted.   Swelling at the site where the pacemaker was inserted.  Follow these instructions at home:  Incision care     Keep the incision clean and dry.  ? Do not take baths, swim, or use a hot tub until your health care provider approves.  ? You may shower the day after your procedure, or as directed by your health care provider.  ? Pat the area dry with a clean towel. Do not rub the area. This may cause bleeding.   Follow instructions from your health care provider about how to take care of your incision. Make sure you:  ? Wash your hands with soap and water before you change your bandage (dressing). If soap and water are not available, use hand sanitizer.  ? Change your dressing as told by your health care provider.  ? Leave stitches (sutures), skin glue, or adhesive strips in place. These skin closures may need to stay in place for 2 weeks or longer. If adhesive strip edges start to loosen and curl up, you may trim the loose edges. Do not remove adhesive strips completely unless your health care provider tells you to do that.   Check your incision area every day for signs of infection. Check for:  ? More redness, swelling, or pain.  ? More fluid or blood.  ? Warmth.  ? Pus or a bad smell.  Activity   Do not lift anything that is heavier than 10 lb (4.5 kg) until your health care provider says it is okay to do so.   For the first 2 weeks, or as long as told by your health care provider:  ? Avoid lifting your left arm higher than your shoulder.  ? Be gentle when you move your arms over your head. It is okay  to raise your arm to comb your hair.  ? Avoid strenuous exercise.   Ask your health care provider when it is okay to:  ? Resume your normal activities.  ? Return to work or school.  ? Resume sexual activity.  Eating and drinking   Eat a heart-healthy diet. This should include plenty of fresh fruits and vegetables, whole grains, low-fat dairy products, and lean protein like chicken and fish.   Limit alcohol intake to no more than 1 drink a day for non-pregnant women and 2 drinks a day for men. One drink equals 12 oz of beer, 5 oz of wine, or 1 oz of hard liquor.   Check ingredients and nutrition facts on packaged foods and beverages. Avoid the following types of food:  ? Food that is high in salt (sodium).  ? Food that is high in saturated fat, like full-fat dairy or red meat.  ? Food that is high in trans fat, like fried food.  ? Food and drinks that are high in sugar.  Lifestyle   Do not use any products that contain nicotine or tobacco, such as cigarettes and e-cigarettes. If you need help quitting, ask your health care provider.   Take steps to manage and control your weight.     Get regular exercise. Aim for 150 minutes of moderate-intensity exercise (such as walking or yoga) or 75 minutes of vigorous exercise (such as running or swimming) each week.   Manage other health problems, such as diabetes or high blood pressure. Ask your health care provider how you can manage these conditions.  General instructions   Do not drive for 24 hours after your procedure if you were given a medicine to help you relax (sedative).   Take over-the-counter and prescription medicines only as told by your health care provider.   Avoid putting pressure on the area where the pacemaker was placed.   If you need an MRI after your pacemaker has been placed, be sure to tell the health care provider who orders the MRI that you have a pacemaker.   Avoid close and prolonged exposure to electrical devices that have strong  magnetic fields. These include:  ? Cell phones. Avoid keeping them in a pocket near the pacemaker, and try using the ear opposite the pacemaker.  ? MP3 players.  ? Household appliances, like microwaves.  ? Metal detectors.  ? Electric generators.  ? High-tension wires.   Keep all follow-up visits as directed by your health care provider. This is important.  Contact a health care provider if:   You have pain at the incision site that is not relieved by over-the-counter or prescription medicines.   You have any of these around your incision site or coming from it:  ? More redness, swelling, or pain.  ? Fluid or blood.  ? Warmth to the touch.  ? Pus or a bad smell.   You have a fever.   You feel brief, occasional palpitations, light-headedness, or any symptoms that you think might be related to your heart.  Get help right away if:   You experience chest pain that is different from the pain at the pacemaker site.   You develop a red streak that extends above or below the incision site.   You experience shortness of breath.   You have palpitations or an irregular heartbeat.   You have light-headedness that does not go away quickly.   You faint or have dizzy spells.   Your pulse suddenly drops or increases rapidly and does not return to normal.   You begin to gain weight and your legs and ankles swell.  Summary   After your procedure, it is common to have pain, soreness, and some swelling where the pacemaker was inserted.   Make sure to keep your incision clean and dry. Follow instructions from your health care provider about how to take care of your incision.   Check your incision every day for signs of infection, such as more pain or swelling, pus or a bad smell, warmth, or leaking fluid and blood.   Avoid strenuous exercise and lifting your left arm higher than your shoulder for 2 weeks, or as long as told by your health care provider.  This information is not intended to replace advice given to you by  your health care provider. Make sure you discuss any questions you have with your health care provider.  Document Released: 11/10/2012 Document Revised: 12/13/2015 Document Reviewed: 12/13/2015  Elsevier Interactive Patient Education  2019 [employer]

## 2018-07-29 NOTE — Interval H&P Note (Signed)
History and Physical Interval Note:  07/29/2018 1:46 PM  Ana Martin  has presented today for surgery, with the diagnosis of eri.  The various methods of treatment have been discussed with the patient and family. After consideration of risks, benefits and other options for treatment, the patient has consented to  Procedure(s): BIV ICD Sunflower (N/A) as a surgical intervention.  The patient's history has been reviewed, patient examined, no change in status, stable for surgery.  I have reviewed the patient's chart and labs.  Questions were answered to the patient's satisfaction.    ICD Criteria  Current LVEF:35%. Within 12 months prior to implant: Yes   Heart failure history: Yes, Class III  Cardiomyopathy history: Yes, Non-Ischemic Cardiomyopathy.  Atrial Fibrillation/Atrial Flutter: Yes, Persistent (> 7 Days).  Ventricular tachycardia history: No.  Cardiac arrest history: No.  History of syndromes with risk of sudden death: No.  Previous ICD: Yes, Reason for ICD:  Primary prevention.  Current ICD indication: Primary  PPM indication: Yes. Pacing type: Ventricular. Greater than 40% RV pacing requirement anticipated. Indication: Complete Heart Block  Class I or II Bradycardia indication present: Yes  Beta Blocker therapy for 3 or more months: Yes, prescribed.   Ace Inhibitor/ARB therapy for 3 or more months: Yes, prescribed.    I have seen Ana Martin is a 63 y.o. female with prior BiV ICD who presents today for generator change.  The patient has had opportunities to ask questions and have them answered. The patient and I have decided together through the [employer] Decision Support Tool to proceed with  BiVICD generator change at this time.  Risks, benefits, alternatives to the procedure were discussed in detail with the patient today. The patient  understands that the risks include but are not limited to bleeding, infection, pneumothorax,  perforation, tamponade, vascular damage, renal failure, MI, stroke, death, inappropriate shocks, and lead dislodgement and wishes to proceed.    Thompson Grayer MD, [facility] 07/29/2018 1:48 PM

## 2018-08-03 ENCOUNTER — Other Ambulatory Visit: Payer: Self-pay

## 2018-08-03 ENCOUNTER — Ambulatory Visit (INDEPENDENT_AMBULATORY_CARE_PROVIDER_SITE_OTHER): Payer: PPO | Admitting: *Deleted

## 2018-08-03 DIAGNOSIS — Z952 Presence of prosthetic heart valve: Secondary | ICD-10-CM

## 2018-08-03 DIAGNOSIS — Z5181 Encounter for therapeutic drug level monitoring: Secondary | ICD-10-CM

## 2018-08-03 DIAGNOSIS — I4891 Unspecified atrial fibrillation: Secondary | ICD-10-CM | POA: Diagnosis not present

## 2018-08-03 LAB — POCT INR: INR: 2.4 (ref 2.0–3.0)

## 2018-08-03 NOTE — Patient Instructions (Signed)
Continue coumadin 1 tablet daily except 1/2 tablet on Fridays Had generator change out last week Recheck in 2 wks

## 2018-08-09 ENCOUNTER — Telehealth: Payer: Self-pay

## 2018-08-09 NOTE — Telephone Encounter (Signed)
Appointment 08-10-2018 at 11 am. LMOV[facility]      COVID-19 Pre-Screening Questions:  . In the past 7 to 10 days have you had a cough,  shortness of breath, headache, congestion, fever (100 or greater) body aches, chills, sore throat, or sudden loss of taste or sense of smell?  . Have you been around anyone with known Covid 19. . Have you been around anyone who is awaiting Covid 19 test results in the past 7 to 10 days? . Have you been around anyone who has been exposed to Covid 19, or has mentioned symptoms of Covid 19 within the past 7 to 10 days?  If you have any concerns/questions about symptoms patients report during screening (either on the phone or at threshold). Contact the provider seeing the patient or DOD for further guidance.  If neither are available contact a member of the leadership team.

## 2018-08-09 NOTE — Telephone Encounter (Signed)
Patient is returning your call.  

## 2018-08-09 NOTE — Telephone Encounter (Signed)
Pt answered No to all Covid-19 prescreening questions. I told her to wear a mask and if she can come alone to the appointment to please do so. We are limiting the amount of people coming into the office. I also told her if anything changes between now and her appointment time to please call the office to let us know. The pt verbalized understanding.

## 2018-08-10 ENCOUNTER — Other Ambulatory Visit: Payer: Self-pay

## 2018-08-10 ENCOUNTER — Ambulatory Visit (INDEPENDENT_AMBULATORY_CARE_PROVIDER_SITE_OTHER): Payer: PPO | Admitting: *Deleted

## 2018-08-10 DIAGNOSIS — I5022 Chronic systolic (congestive) heart failure: Secondary | ICD-10-CM | POA: Diagnosis not present

## 2018-08-10 LAB — CUP PACEART INCLINIC DEVICE CHECK
Date Time Interrogation Session: 20200707134634
Implantable Lead Implant Date: 20090817
Implantable Lead Implant Date: 20090817
Implantable Lead Implant Date: 20130326
Implantable Lead Location: 753858
Implantable Lead Location: 753859
Implantable Lead Location: 753860
Implantable Lead Model: 350
Implantable Lead Model: 350
Implantable Lead Model: 511212
Implantable Lead Serial Number: 10331293
Implantable Lead Serial Number: 195045
Implantable Lead Serial Number: 24873266
Implantable Pulse Generator Implant Date: 20200625

## 2018-08-10 NOTE — Progress Notes (Signed)
Wound check appointment. Steri-strips removed. Wound without redness or edema. Incision edges approximated, wound well healed. Normal device function. Thresholds, sensing, impedances consistent with previous measurements. Device programmed to maximize longevity. No high ventricular rates noted. 24 AT/AF episodes (11.8% burden), available EGMs suggest AF (longest duration 7hr), pt started on Xarelto 08/10/18. AT/AF daily burden alert turned on, burden time increased to 24 hr. Device programmed at appropriate safety margins. Histogram distribution appropriate for patient activity level. Estimated longevity 11.2 years. Patient enrolled in remote follow-up. Plan to follow every 3 months remotely and ROV w/ WC 10/26/18. Patient education completed.

## 2018-08-10 NOTE — Patient Instructions (Signed)
Put dry gauze over skin tear, change everyday. Keep uncovered at home. Wash site with gentle soap and water.

## 2018-08-18 ENCOUNTER — Ambulatory Visit (INDEPENDENT_AMBULATORY_CARE_PROVIDER_SITE_OTHER): Payer: PPO | Admitting: *Deleted

## 2018-08-18 DIAGNOSIS — I4891 Unspecified atrial fibrillation: Secondary | ICD-10-CM | POA: Diagnosis not present

## 2018-08-18 DIAGNOSIS — Z5181 Encounter for therapeutic drug level monitoring: Secondary | ICD-10-CM

## 2018-08-18 DIAGNOSIS — Z952 Presence of prosthetic heart valve: Secondary | ICD-10-CM | POA: Diagnosis not present

## 2018-08-18 LAB — POCT INR: INR: 2.1 (ref 2.0–3.0)

## 2018-08-18 NOTE — Patient Instructions (Signed)
Increase coumadin to 1 tablet daily  Recheck in 3 wks

## 2018-08-19 DIAGNOSIS — E1169 Type 2 diabetes mellitus with other specified complication: Secondary | ICD-10-CM | POA: Diagnosis not present

## 2018-08-19 DIAGNOSIS — I10 Essential (primary) hypertension: Secondary | ICD-10-CM | POA: Diagnosis not present

## 2018-08-19 DIAGNOSIS — E782 Mixed hyperlipidemia: Secondary | ICD-10-CM | POA: Diagnosis not present

## 2018-08-19 DIAGNOSIS — E039 Hypothyroidism, unspecified: Secondary | ICD-10-CM | POA: Diagnosis not present

## 2018-08-25 DIAGNOSIS — Z85850 Personal history of malignant neoplasm of thyroid: Secondary | ICD-10-CM | POA: Diagnosis not present

## 2018-08-25 DIAGNOSIS — E1169 Type 2 diabetes mellitus with other specified complication: Secondary | ICD-10-CM | POA: Diagnosis not present

## 2018-08-25 DIAGNOSIS — F332 Major depressive disorder, recurrent severe without psychotic features: Secondary | ICD-10-CM | POA: Diagnosis not present

## 2018-08-25 DIAGNOSIS — Z95810 Presence of automatic (implantable) cardiac defibrillator: Secondary | ICD-10-CM | POA: Diagnosis not present

## 2018-08-25 DIAGNOSIS — E782 Mixed hyperlipidemia: Secondary | ICD-10-CM | POA: Diagnosis not present

## 2018-08-25 DIAGNOSIS — Z952 Presence of prosthetic heart valve: Secondary | ICD-10-CM | POA: Diagnosis not present

## 2018-08-25 DIAGNOSIS — N183 Chronic kidney disease, stage 3 (moderate): Secondary | ICD-10-CM | POA: Diagnosis not present

## 2018-08-25 DIAGNOSIS — J449 Chronic obstructive pulmonary disease, unspecified: Secondary | ICD-10-CM | POA: Diagnosis not present

## 2018-08-25 DIAGNOSIS — E039 Hypothyroidism, unspecified: Secondary | ICD-10-CM | POA: Diagnosis not present

## 2018-08-25 DIAGNOSIS — I129 Hypertensive chronic kidney disease with stage 1 through stage 4 chronic kidney disease, or unspecified chronic kidney disease: Secondary | ICD-10-CM | POA: Diagnosis not present

## 2018-08-25 DIAGNOSIS — I4891 Unspecified atrial fibrillation: Secondary | ICD-10-CM | POA: Diagnosis not present

## 2018-08-25 DIAGNOSIS — I5022 Chronic systolic (congestive) heart failure: Secondary | ICD-10-CM | POA: Diagnosis not present

## 2018-09-01 ENCOUNTER — Telehealth: Payer: Self-pay | Admitting: Cardiology

## 2018-09-01 NOTE — Telephone Encounter (Signed)
Patient has scale and bp cuff       Virtual Visit Pre-Appointment Phone Call  "(Name), I am calling you today to discuss your upcoming appointment. We are currently trying to limit exposure to the virus that causes COVID-19 by seeing patients at home rather than in the office."  1. "What is the BEST phone number to call the day of the visit?" - include this in appointment notes  2. Do you have or have access to (through a family member/friend) a smartphone with video capability that we can use for your visit?" a. If yes - list this number in appt notes as cell (if different from BEST phone #) and list the appointment type as a VIDEO visit in appointment notes b. If no - list the appointment type as a PHONE visit in appointment notes  3. Confirm consent - "In the setting of the current Covid19 crisis, you are scheduled for a (phone or video) visit with your provider on (date) at (time).  Just as we do with many in-office visits, in order for you to participate in this visit, we must obtain consent.  If you'd like, I can send this to your mychart (if signed up) or email for you to review.  Otherwise, I can obtain your verbal consent now.  All virtual visits are billed to your insurance company just like a normal visit would be.  By agreeing to a virtual visit, we'd like you to understand that the technology does not allow for your provider to perform an examination, and thus may limit your provider's ability to fully assess your condition. If your provider identifies any concerns that need to be evaluated in person, we will make arrangements to do so.  Finally, though the technology is pretty good, we cannot assure that it will always work on either your or our end, and in the setting of a video visit, we may have to convert it to a phone-only visit.  In either situation, we cannot ensure that we have a secure connection.  Are you willing to proceed?" STAFF: Did the patient verbally acknowledge  consent to telehealth visit? Document YES/NO here: yes   4. Advise patient to be prepared - "Two hours prior to your appointment, go ahead and check your blood pressure, pulse, oxygen saturation, and your weight (if you have the equipment to check those) and write them all down. When your visit starts, your provider will ask you for this information. If you have an Apple Watch or Kardia device, please plan to have heart rate information ready on the day of your appointment. Please have a pen and paper handy nearby the day of the visit as well."  5. Give patient instructions for MyChart download to smartphone OR [email or web address] as below if video visit (depending on what platform provider is using)  6. Inform patient they will receive a phone call 15 minutes prior to their appointment time (may be from unknown caller ID) so they should be prepared to answer    TELEPHONE CALL NOTE  ANETRIA HARWICK has been deemed a candidate for a follow-up tele-health visit to limit community exposure during the Covid-19 pandemic. I spoke with the patient via phone to ensure availability of phone/video source, confirm preferred email & phone number, and discuss instructions and expectations.  I reminded TARAANN OLTHOFF to be prepared with any vital sign and/or heart rhythm information that could potentially be obtained via home monitoring, at the time of her visit. I  reminded Ana Martin to expect a phone call prior to her visit.  Howie Ill 09/01/2018 1:36 PM   INSTRUCTIONS FOR DOWNLOADING THE MYCHART APP TO SMARTPHONE  - The patient must first make sure to have activated MyChart and know their login information - If Apple, go to [employer] and type in MyChart in the search bar and download the app. If Android, ask patient to go to [employer] and type in [place] in the search bar and download the app. The app is free but as with any other app downloads, their phone may require them to  verify saved payment information or Apple/Android password.  - The patient will need to then log into the app with their MyChart username and password, and select [facility] as their healthcare provider to link the account. When it is time for your visit, go to the MyChart app, find appointments, and click Begin Video Visit. Be sure to Select Allow for your device to access the Microphone and Camera for your visit. You will then be connected, and your provider will be with you shortly.  **If they have any issues connecting, or need assistance please contact MyChart service desk [phone])**  **If using a computer, in order to ensure the best quality for their visit they will need to use either of the following Internet Browsers: [employer], or Google Chrome**  IF USING DOXIMITY or DOXY.ME - The patient will receive a link just prior to their visit by text.     FULL LENGTH CONSENT FOR TELE-HEALTH VISIT   I hereby voluntarily request, consent and authorize [facility] and its employed or contracted physicians, physician assistants, nurse practitioners or other licensed health care professionals (the Practitioner), to provide me with telemedicine health care services (the Services") as deemed necessary by the treating Practitioner. I acknowledge and consent to receive the Services by the Practitioner via telemedicine. I understand that the telemedicine visit will involve communicating with the Practitioner through live audiovisual communication technology and the disclosure of certain medical information by electronic transmission. I acknowledge that I have been given the opportunity to request an in-person assessment or other available alternative prior to the telemedicine visit and am voluntarily participating in the telemedicine visit.  I understand that I have the right to withhold or withdraw my consent to the use of telemedicine in the course of my care at any time,  without affecting my right to future care or treatment, and that the Practitioner or I may terminate the telemedicine visit at any time. I understand that I have the right to inspect all information obtained and/or recorded in the course of the telemedicine visit and may receive copies of available information for a reasonable fee.  I understand that some of the potential risks of receiving the Services via telemedicine include:   Delay or interruption in medical evaluation due to technological equipment failure or disruption;  Information transmitted may not be sufficient (e.g. poor resolution of images) to allow for appropriate medical decision making by the Practitioner; and/or   In rare instances, security protocols could fail, causing a breach of personal health information.  Furthermore, I acknowledge that it is my responsibility to provide information about my medical history, conditions and care that is complete and accurate to the best of my ability. I acknowledge that Practitioner's advice, recommendations, and/or decision may be based on factors not within their control, such as incomplete or inaccurate data provided by me or distortions of  diagnostic images or specimens that may result from electronic transmissions. I understand that the practice of medicine is not an exact science and that Practitioner makes no warranties or guarantees regarding treatment outcomes. I acknowledge that I will receive a copy of this consent concurrently upon execution via email to the email address I last provided but may also request a printed copy by calling the office of [facility].    I understand that my insurance will be billed for this visit.   I have read or had this consent read to me.  I understand the contents of this consent, which adequately explains the benefits and risks of the Services being provided via telemedicine.   I have been provided ample opportunity to ask questions regarding  this consent and the Services and have had my questions answered to my satisfaction.  I give my informed consent for the services to be provided through the use of telemedicine in my medical care  By participating in this telemedicine visit I agree to the above.

## 2018-09-02 ENCOUNTER — Telehealth: Payer: Self-pay | Admitting: Internal Medicine

## 2018-09-02 NOTE — Telephone Encounter (Signed)
Debara Pickett [phone]) stating that patient is having chest wall pain with redness and swelling to surgical site.  She thinks that it is infected. Patient cannot travel to [place] today. Requesting to see if someone can look at the site to see if it is infected.

## 2018-09-02 NOTE — Telephone Encounter (Signed)
Left upper device wound site assessed, area with large round purple around device and some swelling. Reports itching and has been scratching. No drainage. Denies pain. Advised not to scratch and device clinic would be contacted.

## 2018-09-02 NOTE — Telephone Encounter (Signed)
Reports for the past couple of days, puffy and red circle around device site. Denies pus but reports a small amount of clear pink drainage at night when she lays down. Denies fever, chills, n/v. Advised to come to the office to have her wound checked.

## 2018-09-03 NOTE — Telephone Encounter (Signed)
Spoke with patient, some improvement in site today. Reviewed with Dr Rayann Heman, plan to follow and follow up with Jonni Sanger as scheduled 8/10.  Pt aware  Chanetta Marshall, NP 09/03/2018 12:11 PM

## 2018-09-06 ENCOUNTER — Encounter: Payer: Self-pay | Admitting: Cardiology

## 2018-09-06 ENCOUNTER — Telehealth (INDEPENDENT_AMBULATORY_CARE_PROVIDER_SITE_OTHER): Payer: PPO | Admitting: Cardiology

## 2018-09-06 VITALS — BP 151/82 | HR 78 | Ht 68.0 in | Wt 210.1 lb

## 2018-09-06 DIAGNOSIS — I10 Essential (primary) hypertension: Secondary | ICD-10-CM

## 2018-09-06 DIAGNOSIS — I4891 Unspecified atrial fibrillation: Secondary | ICD-10-CM

## 2018-09-06 DIAGNOSIS — I5022 Chronic systolic (congestive) heart failure: Secondary | ICD-10-CM

## 2018-09-06 DIAGNOSIS — Z952 Presence of prosthetic heart valve: Secondary | ICD-10-CM

## 2018-09-06 DIAGNOSIS — E782 Mixed hyperlipidemia: Secondary | ICD-10-CM

## 2018-09-06 NOTE — Patient Instructions (Signed)
Medication Instructions:  Your physician recommends that you continue on your current medications as directed. Please refer to the Current Medication list given to you today.  If you need a refill on your cardiac medications before your next appointment, please call your pharmacy.   Lab work: NONE  If you have labs (blood work) drawn today and your tests are completely normal, you will receive your results only by: . MyChart Message (if you have MyChart) OR . A paper copy in the mail If you have any lab test that is abnormal or we need to change your treatment, we will call you to review the results.  Testing/Procedures: NONE   Follow-Up: At [facility], you and your health needs are our priority.  As part of our continuing mission to provide you with exceptional heart care, we have created designated Provider Care Teams.  These Care Teams include your primary Cardiologist (physician) and Advanced Practice Providers (APPs -  Physician Assistants and Nurse Practitioners) who all work together to provide you with the care you need, when you need it. You will need a follow up appointment in 4 months.  Please call our office 2 months in advance to schedule this appointment.  You may see Branch, Jonathan, MD or one of the following Advanced Practice Providers on your designated Care Team:   Brittany Strader, PA-C ([place] Office) . Michele Lenze, PA-C ([place] Office)  Any Other Special Instructions Will Be Listed Below (If Applicable). Thank you for choosing [facility]!     

## 2018-09-06 NOTE — Progress Notes (Signed)
Virtual Visit via Telephone Note   This visit type was conducted due to national recommendations for restrictions regarding the COVID-19 Pandemic (e.g. social distancing) in an effort to limit this patient's exposure and mitigate transmission in our community.  Due to her co-morbid illnesses, this patient is at least at moderate risk for complications without adequate follow up.  This format is felt to be most appropriate for this patient at this time.  The patient did not have access to video technology/had technical difficulties with video requiring transitioning to audio format only (telephone).  All issues noted in this document were discussed and addressed.  No physical exam could be performed with this format.  Please refer to the patient's chart for her  consent to telehealth for [facility].   Date:  09/06/2018   ID:  Ana Martin, DOB 01/13/1956, MRN [id number]  Patient Location: Home Provider Location: Office  PCP:  Ana Squibb, MD  Cardiologist:  Ana Dolly, MD  Electrophysiologist:  Ana Grayer, MD   Evaluation Performed:  Follow-Up Visit  Chief Complaint:  Follow up visit  History of Present Illness:    Ana Martin is a 63 y.o. female seen today for follow up of the following medical problems  1. Chronic systolic HF - 10/3233 [facility] echo LVEF 30-35% - has BiV ICD Biotronikfollowed by EP 11/2017 echo LVEF 35-40% -she reports entersto is $250 a monthafter discusing with her pharmacist  -ICD gen change 07/2018  - no recent SOB/DOE. No recent edema. Weights are stable.  - compliant with meds  2. Atrial flutter - history of prior ablation -denies any recent palpitations  3. PAF - notes indicate history of prior av node ablation, pacerdependent.  - has been on coumadin for anticoag and low dose amiodarone(09/2017 TSH 4.1),. - no bleeding on coumadin. Had also been on plavix for several approx 20 years for unlcear etiologybased on  initial chart review, we stopped plavix at prior visit.   - no palpitations, no bleeding issues on anticoag  4. History of mechanical MVR -done in [facility] in 1999.  -on coumadin and ASA in setting of mechanical valve - no recent symptoms  5. COPD - followed by pcp  6. Hyperlipidemia -compliant with statin, she reports recent labs with pcp   7. Depression -  admission 09/2017 - followed by pcp   8. History of TIA -per report, unclear if this may have been when plavix was started. - we previously stopped plavix    SH: her sister is Ana Martin who is also a patient of mine.     The patient does not have symptoms concerning for COVID-19 infection (fever, chills, cough, or new shortness of breath).    Past Medical History:  Diagnosis Date  . Anxiety   . Atrial fibrillation ([place])   . Automatic implantable cardiac defibrillator in situ    Lumax DR-T 09-20-2007 Dr.Akbary  . Depression   . Hyperlipidemia   . 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 Hypertension   . Lump or mass in breast    at the 7 o'clock position of right breast(non-tender)  . Other abnormal glucose   . Other left bundle Ana Martin block   . Other primary cardiomyopathies   . Thyroid cancer ([place])   . Thyroid disease   . Unspecified vitamin D deficiency   . Wolff-Parkinson-White syndrome    Past Surgical History:  Procedure Laterality Date  . ABDOMINAL HYSTERECTOMY    .  BIV ICD GENERATOR CHANGEOUT N/A 07/29/2018   Procedure: BIV ICD GENERATOR CHANGEOUT;  Surgeon: Ana Grayer, MD;  Location: [place] [facility];  Service: Cardiovascular;  Laterality: N/A;  . CARDIAC VALVE REPLACEMENT     st jude silzone mitray mechanical [id number] ss# [id number]  . COLONOSCOPY     April 12, 2008  . PACEMAKER PLACEMENT       Current Meds  Medication Sig  . acetaminophen (TYLENOL) 500 MG tablet Take 500-1,000 mg by mouth every 6 (six) hours as needed (for pain.).  Marland Kitchen  albuterol (PROAIR HFA) 108 (90 Base) MCG/ACT inhaler Inhale 1 puff into the lungs every 4 (four) hours as needed.  . ALPRAZolam (XANAX) 0.5 MG tablet Take 0.5 mg by mouth 2 (two) times a day. for anxiety  . amiodarone (PACERONE) 200 MG tablet Take 1 tablet (200 mg total) by mouth daily. (Patient taking differently: Take 200 mg by mouth every evening. )  . aspirin EC 81 MG tablet Take 81 mg by mouth every evening.   Marland Kitchen atorvastatin (LIPITOR) 40 MG tablet Take 40 mg by mouth every evening. for cholesterol  . budesonide-formoterol (SYMBICORT) 160-4.5 MCG/ACT inhaler Inhale 2 puffs into the lungs 2 (two) times daily.  . carvedilol (COREG) 12.5 MG tablet Take 12.5 mg by mouth 2 (two) times daily with a meal.  . furosemide (LASIX) 40 MG tablet Take 1 tablet (40 mg total) by mouth daily.  Marland Kitchen levothyroxine (SYNTHROID) 175 MCG tablet Take 175 mcg by mouth daily before breakfast.  . losartan (COZAAR) 25 MG tablet Take 25 mg by mouth every evening.   . Melatonin 5 MG TABS Take 5-10 mg by mouth at bedtime as needed (sleep.).  Marland Kitchen metFORMIN (GLUCOPHAGE-XR) 500 MG 24 hr tablet Take 500 mg by mouth daily with lunch.   . potassium chloride SA (K-DUR) 20 MEQ tablet Take 20 mEq by mouth daily as needed (leg pain.).  Marland Kitchen promethazine (PHENERGAN) 25 MG tablet Take 25 mg by mouth at bedtime.   Marland Kitchen spironolactone (ALDACTONE) 25 MG tablet TAKE 1/2 TABLET BY MOUTH DAILY (Patient taking differently: Take 12.5 mg by mouth every evening. )  . venlafaxine XR (EFFEXOR-XR) 150 MG 24 hr capsule Take 150 mg by mouth at bedtime.  . Vitamin D, Ergocalciferol, (DRISDOL) 1.25 MG (50000 UT) CAPS capsule Take 50,000 Units by mouth once a week. Wednesdays & Thursdays.  Marland Kitchen warfarin (COUMADIN) 2.5 MG tablet Take 1 tablet daily except 1/2 tablet on Tuesdays and Fridays or as directed (Patient taking differently: Take 2.5 mg by mouth every evening. )     Allergies:   Codeine   Social History   Tobacco Use  . Smoking status: Former Smoker     Packs/day: 1.00    Years: 25.00    Pack years: 25.00    Types: Cigarettes    Quit date: 10/17/2009    Years since quitting: 8.8  . Smokeless tobacco: Never Used  Substance Use Topics  . Alcohol use: No  . Drug use: No     Family Hx: The patient's family history includes COPD in her mother; Lung cancer in her father; Sarcoidosis in her mother.  ROS:   Please see the history of present illness.     All other systems reviewed and are negative.   Prior CV studies:   The following studies were reviewed today:  08/2015 echo Findings Mitral Valve Mechanical prosthetic valve in mitral position. Aortic Valve The aortic valve leaflets were not well visualized. Tricuspid Valve Tricuspid valve  was not well visualized Pulmonic Valve The pulmonic valve was not well visualized Left Atrium Normal left atrium. Left Ventricle Moderately dilated left ventricle. Moderate to severe global LV hypokinesis. Ejection fraction is visually estimated at 30-35% Right Atrium Mild Right atrial enlargement Right Ventricle Mildly dilated right ventricle. Pacer and/or defibrillator wires visualized in right ventricle. Pericardial Effusion No evidence of pericardial effusion.   11/2017 echo w contrast Study Conclusions  - Limited study with echocontrast to evaluate LV function. - Left ventricle: Systolic function was moderately reduced. The estimated ejection fraction was in the range of 35% to 40%. Diffuse hypokinesis.  11/2017 echo Study Conclusions  - Left ventricle: The cavity size was normal. Wall thickness was increased in a pattern of mild LVH. Systolic function was moderately reduced. The estimated ejection fraction was in the range of 35% to 40%. Unable to determine diastolic function in setting of MVR. - Mitral valve: There is a mechanical valve unknown type or size in the MV position. - Left atrium: The atrium was severely dilated. - Right ventricle:  Poorly visualized. TAPSE would suggest mild systolic dysfunction. TAPSE: 14.1 mm . - Technically difficult study.  Impressions:  - LVEF appears to be around 35-40%. Some limitation in the visualization of the endocardium, consider limited study with echocontrast  Labs/Other Tests and Data Reviewed:    EKG:  No ECG reviewed.  Recent Labs: 09/09/2017: Magnesium 2.1 11/23/2017: ALT 17 12/09/2017: B Natriuretic Peptide 92.0 07/26/2018: BUN 19; Creatinine, Ser 1.08; Hemoglobin 12.6; Platelets 157; Potassium 4.1; Sodium 135   Recent Lipid Panel No results found for: CHOL, TRIG, HDL, CHOLHDL, LDLCALC, LDLDIRECT  Wt Readings from Last 3 Encounters:  09/06/18 210 lb 1.6 oz (95.3 kg)  07/29/18 215 lb (97.5 kg)  04/09/18 209 lb (94.8 kg)     Objective:    Vital Signs:  BP (!) 151/82   Pulse 78   Ht 5\' 8"  (1.727 m)   Wt 210 lb 1.6 oz (95.3 kg)   BMI 31.95 kg/m    Normal affect. Normal speech pattern and tone. Comfortable, no apparent distress. No audible signs of SOb or wheezing.   ASSESSMENT & PLAN:    1. Chronic systolic HF - entresto too expensive, remains on ARB - no recent symptoms.  - continue current meds  2. Mechanical MVR - she is on coumadin and ASA in setting of mechanical mitral valve.  - doing well without symptoms, continue to monitor.     3. PAF - history of av nodal ablation, pacer dependent. Has been on amio -no recent issues  4. Hyperlipidemia - continue statin, request labs from pcp  5. HTN - elevated this AM, has just taking meds. She will call us later in the week with her bp's monitored in the afternoon. Would likely increase losartan if above goal    COVID-19 Education: The signs and symptoms of COVID-19 were discussed with the patient and how to seek care for testing (follow up with PCP or arrange E-visit).  The importance of social distancing was discussed today.  Time:   Today, I have spent 25 minutes with the patient with  telehealth technology discussing the above problems.     Medication Adjustments/Labs and Tests Ordered: Current medicines are reviewed at length with the patient today.  Concerns regarding medicines are outlined above.   Tests Ordered: No orders of the defined types were placed in this encounter.   Medication Changes: No orders of the defined types were placed in this encounter.  Follow Up:  In Person in 4 month(s)  Signed, Ana Dolly, MD  09/06/2018 9:51 AM    [place]

## 2018-09-08 ENCOUNTER — Other Ambulatory Visit: Payer: Self-pay

## 2018-09-08 ENCOUNTER — Ambulatory Visit (INDEPENDENT_AMBULATORY_CARE_PROVIDER_SITE_OTHER): Payer: PPO | Admitting: *Deleted

## 2018-09-08 DIAGNOSIS — Z5181 Encounter for therapeutic drug level monitoring: Secondary | ICD-10-CM | POA: Diagnosis not present

## 2018-09-08 DIAGNOSIS — Z952 Presence of prosthetic heart valve: Secondary | ICD-10-CM

## 2018-09-08 DIAGNOSIS — I4891 Unspecified atrial fibrillation: Secondary | ICD-10-CM

## 2018-09-08 LAB — POCT INR: INR: 4 — AB (ref 2.0–3.0)

## 2018-09-08 NOTE — Patient Instructions (Signed)
Take coumadin 1/2 tablet tonight then resume 1 tablet daily  Recheck in 3 wks

## 2018-09-13 ENCOUNTER — Encounter: Payer: PPO | Admitting: Student

## 2018-09-22 DIAGNOSIS — B379 Candidiasis, unspecified: Secondary | ICD-10-CM | POA: Diagnosis not present

## 2018-10-07 ENCOUNTER — Telehealth: Payer: Self-pay | Admitting: *Deleted

## 2018-10-07 NOTE — Telephone Encounter (Addendum)
Called patient. Offered to place an order for her to go to a [facility] corp in [place] but patient wished to try to get it checked at PCP office. I have provided patient our fax number and requested she have PCP office fax [place] the results. We will contact her with dosing and next appointment. She plans to go today, if they are still open or first thing tomorrow AM

## 2018-10-07 NOTE — Telephone Encounter (Signed)
Patient missed appointment and does not think she can wait until first available in [place]. She wanted to know if she needs to go on to her PCP to have level checked or if she could be sent to lab somewhere

## 2018-10-12 NOTE — Telephone Encounter (Signed)
Patient did not get INR checked with PCP. I have scheduled her for tomorrow at 2:15 in [place] with Lattie Haw.

## 2018-10-14 ENCOUNTER — Other Ambulatory Visit: Payer: Self-pay

## 2018-10-14 ENCOUNTER — Ambulatory Visit (INDEPENDENT_AMBULATORY_CARE_PROVIDER_SITE_OTHER): Payer: PPO | Admitting: *Deleted

## 2018-10-14 DIAGNOSIS — Z5181 Encounter for therapeutic drug level monitoring: Secondary | ICD-10-CM | POA: Diagnosis not present

## 2018-10-14 DIAGNOSIS — I4891 Unspecified atrial fibrillation: Secondary | ICD-10-CM | POA: Diagnosis not present

## 2018-10-14 DIAGNOSIS — Z952 Presence of prosthetic heart valve: Secondary | ICD-10-CM

## 2018-10-14 LAB — POCT INR: INR: 4.3 — AB (ref 2.0–3.0)

## 2018-10-14 NOTE — Patient Instructions (Signed)
Hold coumadin tonight then decrease dose to 1 tablet daily except 1/2 tablet on Sundays  Recheck in 3 wks

## 2018-10-19 ENCOUNTER — Telehealth: Payer: Self-pay | Admitting: *Deleted

## 2018-10-19 NOTE — Telephone Encounter (Signed)
Received OptiVol alert regarding possible fluid accumulation since 10/17/18. Pending [employer] engineering review regarding device observation that "OptiVol Events have invalid data."  Spoke with patient. She reports some bloating yesterday, so she took 40mg  of furosemide yesterday and another 40mg  today. Typically takes PRN rather than daily. Pt denies any other symptoms, feels she is back at baseline. Pt agrees to call for weight gain or symptoms not improved by furosemide. Reviewed fluid and sodium restriction. Pt is interested in [facility], advised we will initiate ICM monthly calls after her appointment with Dr. Rayann Heman on 11/01/18. Pt is in agreement with plan, denies additional questions or concerns at this time.

## 2018-10-20 ENCOUNTER — Telehealth: Payer: Self-pay

## 2018-10-20 NOTE — Telephone Encounter (Signed)
Patient of Dr Jackalyn Lombard referred by Levander Campion, device RN due to recent elevated Optiovl thoracic impedance and patient expressed interest in [facility].  Left message with patient and advised will call back.

## 2018-10-26 ENCOUNTER — Other Ambulatory Visit: Payer: Self-pay | Admitting: Cardiology

## 2018-10-29 ENCOUNTER — Telehealth: Payer: Self-pay

## 2018-10-29 ENCOUNTER — Ambulatory Visit (INDEPENDENT_AMBULATORY_CARE_PROVIDER_SITE_OTHER): Payer: PPO | Admitting: *Deleted

## 2018-10-29 DIAGNOSIS — I428 Other cardiomyopathies: Secondary | ICD-10-CM

## 2018-10-29 NOTE — Telephone Encounter (Signed)
Left a message regarding appt on 11/01/18.

## 2018-10-31 LAB — CUP PACEART REMOTE DEVICE CHECK
Date Time Interrogation Session: 20200927125804
Implantable Lead Implant Date: 20090817
Implantable Lead Implant Date: 20090817
Implantable Lead Implant Date: 20130326
Implantable Lead Location: 753858
Implantable Lead Location: 753859
Implantable Lead Location: 753860
Implantable Lead Model: 350
Implantable Lead Model: 350
Implantable Lead Model: 511212
Implantable Lead Serial Number: 10331293
Implantable Lead Serial Number: 195045
Implantable Lead Serial Number: 24873266
Implantable Pulse Generator Implant Date: 20200625

## 2018-11-01 ENCOUNTER — Telehealth (INDEPENDENT_AMBULATORY_CARE_PROVIDER_SITE_OTHER): Payer: PPO | Admitting: Internal Medicine

## 2018-11-01 ENCOUNTER — Encounter: Payer: Self-pay | Admitting: Internal Medicine

## 2018-11-01 ENCOUNTER — Other Ambulatory Visit: Payer: Self-pay

## 2018-11-01 VITALS — Ht 68.0 in | Wt 210.0 lb

## 2018-11-01 DIAGNOSIS — I5022 Chronic systolic (congestive) heart failure: Secondary | ICD-10-CM | POA: Diagnosis not present

## 2018-11-01 DIAGNOSIS — I10 Essential (primary) hypertension: Secondary | ICD-10-CM | POA: Diagnosis not present

## 2018-11-01 DIAGNOSIS — F322 Major depressive disorder, single episode, severe without psychotic features: Secondary | ICD-10-CM | POA: Diagnosis not present

## 2018-11-01 DIAGNOSIS — N183 Chronic kidney disease, stage 3 (moderate): Secondary | ICD-10-CM | POA: Diagnosis not present

## 2018-11-01 DIAGNOSIS — I428 Other cardiomyopathies: Secondary | ICD-10-CM | POA: Diagnosis not present

## 2018-11-01 DIAGNOSIS — Z952 Presence of prosthetic heart valve: Secondary | ICD-10-CM

## 2018-11-01 DIAGNOSIS — I480 Paroxysmal atrial fibrillation: Secondary | ICD-10-CM

## 2018-11-01 DIAGNOSIS — E039 Hypothyroidism, unspecified: Secondary | ICD-10-CM | POA: Diagnosis not present

## 2018-11-01 DIAGNOSIS — I4891 Unspecified atrial fibrillation: Secondary | ICD-10-CM | POA: Diagnosis not present

## 2018-11-01 DIAGNOSIS — J449 Chronic obstructive pulmonary disease, unspecified: Secondary | ICD-10-CM | POA: Diagnosis not present

## 2018-11-01 DIAGNOSIS — E782 Mixed hyperlipidemia: Secondary | ICD-10-CM | POA: Diagnosis not present

## 2018-11-01 DIAGNOSIS — Z954 Presence of other heart-valve replacement: Secondary | ICD-10-CM | POA: Diagnosis not present

## 2018-11-01 NOTE — Progress Notes (Signed)
Electrophysiology TeleHealth Note  Due to national recommendations of social distancing due to [place] 19, an audio telehealth visit is felt to be most appropriate for this patient at this time.  Verbal consent was obtained by me for the telehealth visit today.  The patient does not have capability for a virtual visit.  A phone visit is therefore required today.   Date:  11/01/2018   ID:  Ana Martin, DOB 15-Nov-1955, MRN [id number]  Location: patient's home  Provider location:  [facility]  Evaluation Performed: Follow-up visit  PCP:  Celene Squibb, MD   Electrophysiologist:  Dr Rayann Heman  Chief Complaint:  CHF  History of Present Illness:    Ana Martin is a 63 y.o. female who presents via telehealth conferencing today.  Since her recent BiV ICD generator change, the patient reports doing very well.  Today, she denies symptoms of palpitations, chest pain, shortness of breath,  lower extremity edema, dizziness, presyncope, or syncope.  The patient is otherwise without complaint today.  The patient denies symptoms of fevers, chills, cough, or new SOB worrisome for COVID 19.  Past Medical History:  Diagnosis Date  . Anxiety   . Atrial fibrillation ([place])   . Automatic implantable cardiac defibrillator in situ    Lumax DR-T 09-20-2007 Dr.Akbary  . Depression   . Hyperlipidemia   . 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 Hypertension   . Lump or mass in breast    at the 7 o'clock position of right breast(non-tender)  . Other abnormal glucose   . Other left bundle branch block   . Other primary cardiomyopathies   . Thyroid cancer ([place])   . Thyroid disease   . Unspecified vitamin D deficiency   . Wolff-Parkinson-White syndrome     Past Surgical History:  Procedure Laterality Date  . ABDOMINAL HYSTERECTOMY    . BIV ICD GENERATOR CHANGEOUT N/A 07/29/2018   Procedure: BIV ICD GENERATOR CHANGEOUT;  Surgeon: Thompson Grayer, MD;  Location: [place] [facility];  Service: Cardiovascular;  Laterality: N/A;  . CARDIAC VALVE REPLACEMENT     st jude silzone mitray mechanical [id number] ss# [id number]  . COLONOSCOPY     April 12, 2008  . PACEMAKER PLACEMENT      Current Outpatient Medications  Medication Sig Dispense Refill  . albuterol (PROAIR HFA) 108 (90 Base) MCG/ACT inhaler Inhale 1 puff into the lungs every 4 (four) hours as needed.    . ALPRAZolam (XANAX) 0.5 MG tablet Take 0.5 mg by mouth 2 (two) times a day. for anxiety  0  . amiodarone (PACERONE) 200 MG tablet Take 1 tablet (200 mg total) by mouth daily. (Patient taking differently: Take 200 mg by mouth every evening. ) 30 tablet 0  . aspirin EC 81 MG tablet Take 81 mg by mouth every evening.     Marland Kitchen atorvastatin (LIPITOR) 40 MG tablet Take 40 mg by mouth every evening. for cholesterol  0  . budesonide-formoterol (SYMBICORT) 160-4.5 MCG/ACT inhaler Inhale 2 puffs into the lungs 2 (two) times daily.    . carvedilol (COREG) 12.5 MG tablet Take 12.5 mg by mouth 2 (two) times daily with a meal.    . furosemide (LASIX) 40 MG tablet Take 1 tablet (40 mg total) by mouth daily. 30 tablet 0  . levothyroxine (SYNTHROID) 200 MCG tablet Take 1 tablet by mouth daily.    Marland Kitchen losartan (COZAAR) 25 MG tablet Take 25 mg by  mouth every evening.   4  . Melatonin 5 MG TABS Take 5-10 mg by mouth at bedtime as needed (sleep.).    Marland Kitchen metFORMIN (GLUCOPHAGE-XR) 500 MG 24 hr tablet Take 500 mg by mouth daily with lunch.     . potassium chloride SA (K-DUR) 20 MEQ tablet Take 20 mEq by mouth daily as needed (leg pain.).    Marland Kitchen promethazine (PHENERGAN) 25 MG tablet Take 25 mg by mouth at bedtime.     Marland Kitchen spironolactone (ALDACTONE) 25 MG tablet Take 12.5 mg by mouth daily.    Marland Kitchen venlafaxine XR (EFFEXOR-XR) 150 MG 24 hr capsule Take 150 mg by mouth at bedtime.    . Vitamin D, Ergocalciferol, (DRISDOL) 1.25 MG (50000 UT) CAPS capsule Take 50,000 Units by mouth once a week. Wednesdays & Thursdays.    Marland Kitchen warfarin (COUMADIN)  2.5 MG tablet TAKE 1 TABLET BY MOUTH DAILY EXCEPT ON TUESDAY AND FRIDAYS TAKE ONE-HALF TABLET 90 tablet 1   No current facility-administered medications for this visit.     Allergies:   Codeine   Social History:  The patient  reports that she quit smoking about 9 years ago. Her smoking use included cigarettes. She has a 25.00 pack-year smoking history. She has never used smokeless tobacco. She reports that she does not drink alcohol or use drugs.   Family History:  The patient's family history includes COPD in her mother; Lung cancer in her father; Sarcoidosis in her mother.   ROS:  Please see the history of present illness.   All other systems are personally reviewed and negative.    Exam:    Vital Signs:  Ht 5\' 8"  (1.727 m)   Wt 210 lb (95.3 kg)   BMI 31.93 kg/m   Well sounding, alert and conversant    Labs/Other Tests and Data Reviewed:    Recent Labs: 11/23/2017: ALT 17 12/09/2017: B Natriuretic Peptide 92.0 07/26/2018: BUN 19; Creatinine, Ser 1.08; Hemoglobin 12.6; Platelets 157; Potassium 4.1; Sodium 135   Wt Readings from Last 3 Encounters:  11/01/18 210 lb (95.3 kg)  09/06/18 210 lb 1.6 oz (95.3 kg)  07/29/18 215 lb (97.5 kg)     Last device remote is reviewed from [place] PDF which reveals normal device function, no arrhythmias   ASSESSMENT & PLAN:    1.  Nonischemic CM/ chronic systolic dysfunction Doing well s/p BiV ICD generator change Continue remote monitoring Enroll in ICM device clinic with Sharman Cheek.  Pt is excited about this option.  2. Persistent afib/ atypical atrial flutter S/p AV nodal ablation No afib since generator change On coumadin  3. Mechanical MVR On coumadin  Follow-up:  remotes Return to see me in a year in [place] with Dr Harl Bowie as scheduled  Patient Risk:  after full review of this patients clinical status, I feel that they are at moderate risk at this time.  Today, I have spent 15 minutes with the patient with  telehealth technology discussing arrhythmia management .    Army Fossa, MD  11/01/2018 10:13 AM     [facility] [address] [place]  [place] [address] [phone] (office) [phone] (fax)

## 2018-11-03 ENCOUNTER — Encounter: Payer: Self-pay | Admitting: Cardiology

## 2018-11-03 NOTE — Progress Notes (Signed)
Remote ICD transmission.   

## 2018-11-05 DIAGNOSIS — E039 Hypothyroidism, unspecified: Secondary | ICD-10-CM | POA: Diagnosis not present

## 2018-11-05 DIAGNOSIS — J449 Chronic obstructive pulmonary disease, unspecified: Secondary | ICD-10-CM | POA: Diagnosis not present

## 2018-11-05 DIAGNOSIS — F322 Major depressive disorder, single episode, severe without psychotic features: Secondary | ICD-10-CM | POA: Diagnosis not present

## 2018-11-05 DIAGNOSIS — I10 Essential (primary) hypertension: Secondary | ICD-10-CM | POA: Diagnosis not present

## 2018-11-05 DIAGNOSIS — I4891 Unspecified atrial fibrillation: Secondary | ICD-10-CM | POA: Diagnosis not present

## 2018-11-05 DIAGNOSIS — E782 Mixed hyperlipidemia: Secondary | ICD-10-CM | POA: Diagnosis not present

## 2018-11-05 DIAGNOSIS — I5022 Chronic systolic (congestive) heart failure: Secondary | ICD-10-CM | POA: Diagnosis not present

## 2018-11-05 DIAGNOSIS — Z954 Presence of other heart-valve replacement: Secondary | ICD-10-CM | POA: Diagnosis not present

## 2018-11-10 ENCOUNTER — Ambulatory Visit (INDEPENDENT_AMBULATORY_CARE_PROVIDER_SITE_OTHER): Payer: PPO | Admitting: *Deleted

## 2018-11-10 ENCOUNTER — Other Ambulatory Visit: Payer: Self-pay

## 2018-11-10 DIAGNOSIS — Z5181 Encounter for therapeutic drug level monitoring: Secondary | ICD-10-CM | POA: Diagnosis not present

## 2018-11-10 DIAGNOSIS — I4891 Unspecified atrial fibrillation: Secondary | ICD-10-CM

## 2018-11-10 DIAGNOSIS — Z952 Presence of prosthetic heart valve: Secondary | ICD-10-CM | POA: Diagnosis not present

## 2018-11-10 LAB — POCT INR: INR: 2.7 (ref 2.0–3.0)

## 2018-11-10 NOTE — Patient Instructions (Signed)
Continue coumadin 1 tablet daily except 1/2 tablet on Fridays  Recheck in 4 wks

## 2018-11-12 ENCOUNTER — Telehealth: Payer: Self-pay

## 2018-11-12 NOTE — Telephone Encounter (Signed)
Patient referred to [facility] clinic by Dr Rayann Heman.  Attempted ICM intro call to patient and left message with number to return call.

## 2018-11-12 NOTE — Telephone Encounter (Signed)
Patient left voice mail message she was returning the call.  Attempted call back and no answer.

## 2018-11-12 NOTE — Telephone Encounter (Signed)
Spoke with patient for ICM intro.  Since her telephone visit with Dr Rayann Heman in September she has discovered she will be moving to [place].  She will be returning to the cardiologist she had before and the device will be monitored through that clinic.  Her first Cardiologist appt is 02/15/2019.  Advised device will be monitored until the new clinic request a device transfer.  She will call Dr Nelly Laurence office regarding the transfer of medical records.  Device will continue to be monitored per protocol until she transfers to the new cardiologist in January.

## 2018-11-16 ENCOUNTER — Telehealth: Payer: Self-pay | Admitting: *Deleted

## 2018-11-16 ENCOUNTER — Ambulatory Visit: Payer: Self-pay | Admitting: *Deleted

## 2018-11-16 NOTE — Telephone Encounter (Signed)
Says she is moving in a few days and will be following up with her old cardiologist in [place]

## 2018-12-02 ENCOUNTER — Other Ambulatory Visit: Payer: Self-pay | Admitting: Cardiology

## 2018-12-06 ENCOUNTER — Telehealth: Payer: Self-pay | Admitting: *Deleted

## 2018-12-06 DIAGNOSIS — Z5181 Encounter for therapeutic drug level monitoring: Secondary | ICD-10-CM

## 2018-12-06 DIAGNOSIS — I4891 Unspecified atrial fibrillation: Secondary | ICD-10-CM

## 2018-12-06 DIAGNOSIS — Z952 Presence of prosthetic heart valve: Secondary | ICD-10-CM

## 2018-12-06 NOTE — Addendum Note (Signed)
Addended by: Malen Gauze on: 12/06/2018 03:05 PM   Modules accepted: Orders

## 2018-12-06 NOTE — Telephone Encounter (Signed)
Please call patient in regards to her upcoming coumdin visits.

## 2018-12-06 NOTE — Telephone Encounter (Signed)
Pt has moved to [facility] and needs an order to get her INR checked until she can be seen by new MD in Jan.  Order for PT/INR placed in [employer] with results to me.

## 2018-12-09 DIAGNOSIS — I4891 Unspecified atrial fibrillation: Secondary | ICD-10-CM | POA: Diagnosis not present

## 2018-12-09 DIAGNOSIS — Z952 Presence of prosthetic heart valve: Secondary | ICD-10-CM | POA: Diagnosis not present

## 2018-12-09 DIAGNOSIS — Z5181 Encounter for therapeutic drug level monitoring: Secondary | ICD-10-CM | POA: Diagnosis not present

## 2018-12-09 LAB — POCT INR: INR: 3.5 — AB (ref 2.0–3.0)

## 2018-12-09 LAB — PROTIME-INR

## 2018-12-13 NOTE — Telephone Encounter (Signed)
Vicky. Please see if you can call [employer] and get her results.  If you get then send to coumadin clinic [place]. Thanks, Lattie Haw

## 2018-12-13 NOTE — Telephone Encounter (Signed)
Ana Martin called requesting lab results.

## 2018-12-15 ENCOUNTER — Ambulatory Visit (INDEPENDENT_AMBULATORY_CARE_PROVIDER_SITE_OTHER): Payer: PPO | Admitting: Pharmacist

## 2018-12-15 DIAGNOSIS — Z7901 Long term (current) use of anticoagulants: Secondary | ICD-10-CM

## 2018-12-15 NOTE — Telephone Encounter (Signed)
Received PT/INR for patient thru [place]. Will send to [facility]. Coumdin clinic.  Results have been faxed into the system.

## 2018-12-15 NOTE — Telephone Encounter (Signed)
See anticoag encounter from 11/11 

## 2018-12-22 ENCOUNTER — Telehealth: Payer: Self-pay | Admitting: Internal Medicine

## 2018-12-22 NOTE — Telephone Encounter (Signed)
Spoke with patient. She reports her pacemaker is trying to "lift up" out of her skin. No fevers/chills. Drains at night so she covers it with gauze. Drainage is both clear and blood-tinged at times, no odor. Pt now lives in [place], unable to come to [place] today but can come another day. Advised to send in photo for review by Dr. Rayann Heman. Pt in agreement with plan. Advised if she develops a fever, she needs to proceed to the closest ED. Pt verbalizes understanding.  Photo received. Reviewed with Dr. Rayann Heman and Dr. Lovena Le. Plan to have pt f/u next week in office with Dr. Lovena Le to discuss extraction. LMOVM requesting call back from patient. Direct DC phone number given.

## 2018-12-22 NOTE — Telephone Encounter (Signed)
New Message   1. Has your device fired? No  2. Is you device beeping? No  3. Are you experiencing draining or swelling at device site? Yes  4. Are you calling to see if we received your device transmission? No  5. Have you passed out? No  Patient states it has never completely healed and has lifted about a half a inch out of her skin and can see imprint of device with constant draining. Put alcohol on it last night and it burned is concerned about getting infected.    Please route to [place]

## 2018-12-22 NOTE — Telephone Encounter (Signed)
Patient returned call. Scheduled for appointment on 12/28/18 at 1:30pm with Dr. Lovena Le to discuss extraction. Pt aware to come to appointment by herself. Reiterated instructions to proceed to closest ED for any fevers in the interim. Pt verbalizes understanding and denies additional questions at this time.

## 2018-12-28 ENCOUNTER — Other Ambulatory Visit: Payer: Self-pay

## 2018-12-28 ENCOUNTER — Ambulatory Visit (INDEPENDENT_AMBULATORY_CARE_PROVIDER_SITE_OTHER): Payer: PPO | Admitting: Internal Medicine

## 2018-12-28 ENCOUNTER — Encounter: Payer: Self-pay | Admitting: Internal Medicine

## 2018-12-28 ENCOUNTER — Encounter (INDEPENDENT_AMBULATORY_CARE_PROVIDER_SITE_OTHER): Payer: Self-pay

## 2018-12-28 VITALS — BP 124/74 | HR 95 | Ht 68.0 in | Wt 198.0 lb

## 2018-12-28 DIAGNOSIS — I5022 Chronic systolic (congestive) heart failure: Secondary | ICD-10-CM

## 2018-12-28 DIAGNOSIS — Z95810 Presence of automatic (implantable) cardiac defibrillator: Secondary | ICD-10-CM | POA: Diagnosis not present

## 2018-12-28 MED ORDER — CEPHALEXIN 500 MG PO CAPS: 500.0000 mg | ORAL_CAPSULE | Freq: Two times a day (BID) | ORAL | 1 refills | Status: DC

## 2018-12-28 NOTE — Progress Notes (Signed)
HPI Ana Martin is referred today by Dr. Rayann Heman for evaluation of ICD pocket infection. 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 She had a DDD ICD placed in 2009 and then had a biv upgrade with epicardial leads placed in 2013 and a gen change out 5 months ago. She had swelling redness around her skin initially and presents today with a week of her skin opened and her device exposed. She does not have fever or chills. She is not ill though she is quite anxious. She has daily drainage from her pocket. I have reviewed her CXR which demonstrates a dual coil ICD lead and an active RA lead and 2 epicardial LV leads.  Allergies  Allergen Reactions  . Codeine Nausea Only     Current Outpatient Medications  Medication Sig Dispense Refill  . albuterol (PROAIR HFA) 108 (90 Base) MCG/ACT inhaler Inhale 1 puff into the lungs every 4 (four) hours as needed.    . ALPRAZolam (XANAX) 0.5 MG tablet Take 0.5 mg by mouth 2 (two) times a day. for anxiety  0  . amiodarone (PACERONE) 200 MG tablet Take 1 tablet (200 mg total) by mouth daily. (Patient taking differently: Take 200 mg by mouth every evening. ) 30 tablet 0  . aspirin EC 81 MG tablet Take 81 mg by mouth every evening.     Marland Kitchen atorvastatin (LIPITOR) 40 MG tablet Take 40 mg by mouth every evening. for cholesterol  0  . budesonide-formoterol (SYMBICORT) 160-4.5 MCG/ACT inhaler Inhale 2 puffs into the lungs 2 (two) times daily.    . carvedilol (COREG) 12.5 MG tablet Take 12.5 mg by mouth 2 (two) times daily with a meal.    . furosemide (LASIX) 40 MG tablet Take 1 tablet (40 mg total) by mouth daily. 30 tablet 0  . levothyroxine (SYNTHROID) 200 MCG tablet Take 1 tablet by mouth daily.    Marland Kitchen losartan (COZAAR) 25 MG tablet Take 25 mg by mouth every evening.   4  . Melatonin 5 MG TABS Take 5-10 mg by mouth at bedtime as  needed (sleep.).    Marland Kitchen metFORMIN (GLUCOPHAGE-XR) 500 MG 24 hr tablet Take 500 mg by mouth daily with lunch.     . potassium chloride SA (K-DUR) 20 MEQ tablet Take 20 mEq by mouth daily as needed (leg pain.).    Marland Kitchen promethazine (PHENERGAN) 25 MG tablet Take 25 mg by mouth at bedtime.     Marland Kitchen spironolactone (ALDACTONE) 25 MG tablet TAKE 1/2 TABLET BY MOUTH DAILY 45 tablet 0  . venlafaxine XR (EFFEXOR-XR) 150 MG 24 hr capsule Take 150 mg by mouth at bedtime.    . Vitamin D, Ergocalciferol, (DRISDOL) 1.25 MG (50000 UT) CAPS capsule Take 50,000 Units by mouth once a week. Wednesdays & Thursdays.    Marland Kitchen warfarin (COUMADIN) 2.5 MG tablet TAKE 1 TABLET BY MOUTH DAILY EXCEPT ON TUESDAY AND FRIDAYS TAKE ONE-HALF TABLET 90 tablet 1  . cephALEXin (KEFLEX) 500 MG capsule Take 1 capsule (500 mg total) by mouth 2 (two) times daily. 20 capsule 1   No current facility-administered medications for this visit.      Past Medical History:  Diagnosis Date  . Anxiety   . Atrial fibrillation ([place])   . Automatic implantable cardiac defibrillator in situ    Lumax DR-T 09-20-2007 Dr.Akbary  . Depression   .  Hyperlipidemia   . 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 Hypertension   . Lump or mass in breast    at the 7 o'clock position of right breast(non-tender)  . Other abnormal glucose   . Other left bundle branch block   . Other primary cardiomyopathies   . Thyroid cancer ([place])   . Thyroid disease   . Unspecified vitamin D deficiency   . Wolff-Parkinson-White syndrome     ROS:   All systems reviewed and negative except as noted in the HPI.   Past Surgical History:  Procedure Laterality Date  . ABDOMINAL HYSTERECTOMY    . BIV ICD GENERATOR CHANGEOUT N/A 07/29/2018   Procedure: BIV ICD GENERATOR CHANGEOUT;  Surgeon: Thompson Grayer, MD;  Location: [facility];  Service: Cardiovascular;  Laterality: N/A;  . CARDIAC VALVE REPLACEMENT     st jude silzone mitray mechanical  [id number] ss# [id number]  . COLONOSCOPY     April 12, 2008  . PACEMAKER PLACEMENT       Family History  Problem Relation Age of Onset  . Lung cancer Father        expired 30  . Sarcoidosis Mother        espired 2013  . COPD Mother      Social History   Socioeconomic History  . Marital status: Divorced    Spouse name: Not on file  . Number of children: Not on file  . Years of education: Not on file  . Highest education level: Not on file  Occupational History  . Not on file  Social Needs  . Financial resource strain: Not on file  . Food insecurity    Worry: Not on file    Inability: Not on file  . Transportation needs    Medical: Not on file    Non-medical: Not on file  Tobacco Use  . Smoking status: Former Smoker    Packs/day: 1.00    Years: 25.00    Pack years: 25.00    Types: Cigarettes    Quit date: 10/17/2009    Years since quitting: 9.2  . Smokeless tobacco: Never Used  Substance and Sexual Activity  . Alcohol use: No  . Drug use: No  . Sexual activity: Not Currently  Lifestyle  . Physical activity    Days per week: Not on file    Minutes per session: Not on file  . Stress: Not on file  Relationships  . Social Herbalist on phone: Not on file    Gets together: Not on file    Attends religious service: Not on file    Active member of club or organization: Not on file    Attends meetings of clubs or organizations: Not on file    Relationship status: Not on file  . Intimate partner violence    Fear of current or ex partner: Not on file    Emotionally abused: Not on file    Physically abused: Not on file    Forced sexual activity: Not on file  Other Topics Concern  . Not on file  Social History Narrative  . Not on file     BP 124/74   Pulse 95   Ht 5\' 8"  (1.727 m)   Wt 198 lb (89.8 kg)   SpO2 97%   BMI 30.11 kg/m   Physical Exam:  Well appearing NAD HEENT: Unremarkable Neck:  No JVD, no thyromegally Lymphatics:  No  adenopathy Back:  No CVA tenderness Lungs:  Clear HEART:  Regular rate rhythm, no murmurs, no rubs, no clicks Abd:  soft, positive bowel sounds, no organomegally, no rebound, no guarding Ext:  2 plus pulses, no edema, no cyanosis, no clubbing Skin:  No rashes no nodules Neuro:  CN II through XII intact, motor grossly intact  EKG - nsr with biv pacing  DEVICE  Normal device function.  See PaceArt for details.   Assess/Plan: 1. ICD system infection - she will require a combination approach with her epicardial leads removed surgically and her transvenous leads removed with percutaneous extraction. I have reviewed the indications/risks/benefits/goals/expectations of the procedure with the patient and I will review with Dr. Gerilyn Pilgrim. We tentatively have planned for her surgery in 8 days when our schedule allows. 2. CHB - she is s/p AV node ablation and has no escape rhythm at 30. She will require a temp/perm PM prior to device re-implantation. 3. Atrial fib her VR is well controlled and she is asymptomatic.  4. Chronic systolic heart failure - her last echo was a year ago and demonstrates an EF of 35%.  Mikle Bosworth.D.

## 2018-12-28 NOTE — Patient Instructions (Addendum)
Medication Instructions:  Your physician recommends that you continue on your current medications as directed. Please refer to the Current Medication list given to you today.  Start Keflext 500 mg-  Take one tablet by mouth twice a day  Labwork: You will get lab work today:  BMP, CBC and INR  Testing/Procedures: None ordered.  Follow-Up:  SEE INSTRUCTION LETTER  Any Other Special Instructions Will Be Listed Below (If Applicable).  If you need a refill on your cardiac medications before your next appointment, please call your pharmacy.

## 2018-12-29 ENCOUNTER — Encounter: Payer: Self-pay | Admitting: Cardiothoracic Surgery

## 2018-12-29 ENCOUNTER — Telehealth: Payer: Self-pay

## 2018-12-29 ENCOUNTER — Other Ambulatory Visit: Payer: Self-pay | Admitting: *Deleted

## 2018-12-29 DIAGNOSIS — T827XXA Infection and inflammatory reaction due to other cardiac and vascular devices, implants and grafts, initial encounter: Secondary | ICD-10-CM

## 2018-12-29 LAB — CBC WITH DIFFERENTIAL/PLATELET
Basophils Absolute: 0.1 10*3/uL (ref 0.0–0.2)
Basos: 1 %
EOS (ABSOLUTE): 0.4 10*3/uL (ref 0.0–0.4)
Eos: 4 %
Hematocrit: 46.5 % (ref 34.0–46.6)
Hemoglobin: 14.7 g/dL (ref 11.1–15.9)
Immature Grans (Abs): 0 10*3/uL (ref 0.0–0.1)
Immature Granulocytes: 0 %
Lymphocytes Absolute: 1.6 10*3/uL (ref 0.7–3.1)
Lymphs: 14 %
MCH: 26.5 pg — ABNORMAL LOW (ref 26.6–33.0)
MCHC: 31.6 g/dL (ref 31.5–35.7)
MCV: 84 fL (ref 79–97)
Monocytes Absolute: 0.6 10*3/uL (ref 0.1–0.9)
Monocytes: 5 %
Neutrophils Absolute: 8.5 10*3/uL — ABNORMAL HIGH (ref 1.4–7.0)
Neutrophils: 76 %
Platelets: 210 10*3/uL (ref 150–450)
RBC: 5.54 x10E6/uL — ABNORMAL HIGH (ref 3.77–5.28)
RDW: 17.3 % — ABNORMAL HIGH (ref 11.7–15.4)
WBC: 11.2 10*3/uL — ABNORMAL HIGH (ref 3.4–10.8)

## 2018-12-29 LAB — BASIC METABOLIC PANEL
BUN/Creatinine Ratio: 15 (ref 12–28)
BUN: 13 mg/dL (ref 8–27)
CO2: 26 mmol/L (ref 20–29)
Calcium: 9.6 mg/dL (ref 8.7–10.3)
Chloride: 98 mmol/L (ref 96–106)
Creatinine, Ser: 0.84 mg/dL (ref 0.57–1.00)
GFR calc Af Amer: 85 mL/min/{1.73_m2} (ref >59)
GFR calc non Af Amer: 74 mL/min/{1.73_m2} (ref >59)
Glucose: 89 mg/dL (ref 65–99)
Potassium: 4.5 mmol/L (ref 3.5–5.2)
Sodium: 141 mmol/L (ref 134–144)

## 2018-12-29 LAB — PROTIME-INR
INR: 2.8 — ABNORMAL HIGH (ref 0.9–1.2)
Prothrombin Time: 28.8 s — ABNORMAL HIGH (ref 9.1–12.0)

## 2018-12-29 NOTE — Telephone Encounter (Signed)
Spoke with Pt.  Advised to take warfarin as instructed through 12/31/2018.    Then advised to hold warfarin on Saturday and Sunday.  Pt will then have coumadin clinic appt on 11/30 to check INR and initiate lovenox bridging as indicated.  Pt advised of approval of surgical date/time to arrive.  Pt will have covid test 01/03/2019 prior to coumadin clinic appt

## 2018-12-29 NOTE — Telephone Encounter (Signed)
LMTCB

## 2019-01-03 ENCOUNTER — Other Ambulatory Visit (HOSPITAL_COMMUNITY)
Admission: RE | Admit: 2019-01-03 | Discharge: 2019-01-03 | Disposition: A | Discharge status: Home / Self Care | Payer: PPO | Source: Ambulatory Visit | Attending: Internal Medicine | Admitting: Internal Medicine

## 2019-01-03 ENCOUNTER — Other Ambulatory Visit: Payer: Self-pay | Admitting: Cardiothoracic Surgery

## 2019-01-03 ENCOUNTER — Other Ambulatory Visit: Payer: Self-pay

## 2019-01-03 ENCOUNTER — Ambulatory Visit (INDEPENDENT_AMBULATORY_CARE_PROVIDER_SITE_OTHER): Payer: PPO | Admitting: Pharmacist Clinician (PhC)/ Clinical Pharmacy Specialist

## 2019-01-03 ENCOUNTER — Ambulatory Visit (INDEPENDENT_AMBULATORY_CARE_PROVIDER_SITE_OTHER): Payer: PPO | Admitting: Cardiothoracic Surgery

## 2019-01-03 ENCOUNTER — Ambulatory Visit
Admission: RE | Admit: 2019-01-03 | Discharge: 2019-01-03 | Disposition: A | Discharge status: Home / Self Care | Payer: PPO | Source: Ambulatory Visit | Attending: Cardiothoracic Surgery | Admitting: Cardiothoracic Surgery

## 2019-01-03 DIAGNOSIS — R911 Solitary pulmonary nodule: Secondary | ICD-10-CM

## 2019-01-03 DIAGNOSIS — Z952 Presence of prosthetic heart valve: Secondary | ICD-10-CM

## 2019-01-03 DIAGNOSIS — R0602 Shortness of breath: Secondary | ICD-10-CM | POA: Diagnosis not present

## 2019-01-03 DIAGNOSIS — Z5181 Encounter for therapeutic drug level monitoring: Secondary | ICD-10-CM

## 2019-01-03 DIAGNOSIS — I110 Hypertensive heart disease with heart failure: Secondary | ICD-10-CM | POA: Diagnosis present

## 2019-01-03 DIAGNOSIS — Z4682 Encounter for fitting and adjustment of non-vascular catheter: Secondary | ICD-10-CM | POA: Diagnosis not present

## 2019-01-03 DIAGNOSIS — J449 Chronic obstructive pulmonary disease, unspecified: Secondary | ICD-10-CM | POA: Diagnosis present

## 2019-01-03 DIAGNOSIS — L989 Disorder of the skin and subcutaneous tissue, unspecified: Secondary | ICD-10-CM | POA: Diagnosis present

## 2019-01-03 DIAGNOSIS — K5900 Constipation, unspecified: Secondary | ICD-10-CM | POA: Diagnosis not present

## 2019-01-03 DIAGNOSIS — D649 Anemia, unspecified: Secondary | ICD-10-CM | POA: Diagnosis not present

## 2019-01-03 DIAGNOSIS — I428 Other cardiomyopathies: Secondary | ICD-10-CM | POA: Diagnosis present

## 2019-01-03 DIAGNOSIS — E785 Hyperlipidemia, unspecified: Secondary | ICD-10-CM | POA: Diagnosis present

## 2019-01-03 DIAGNOSIS — Z85850 Personal history of malignant neoplasm of thyroid: Secondary | ICD-10-CM | POA: Diagnosis not present

## 2019-01-03 DIAGNOSIS — E119 Type 2 diabetes mellitus without complications: Secondary | ICD-10-CM | POA: Diagnosis present

## 2019-01-03 DIAGNOSIS — I5023 Acute on chronic systolic (congestive) heart failure: Secondary | ICD-10-CM | POA: Diagnosis not present

## 2019-01-03 DIAGNOSIS — J939 Pneumothorax, unspecified: Secondary | ICD-10-CM | POA: Diagnosis not present

## 2019-01-03 DIAGNOSIS — I5022 Chronic systolic (congestive) heart failure: Secondary | ICD-10-CM | POA: Diagnosis not present

## 2019-01-03 DIAGNOSIS — I447 Left bundle-branch block, unspecified: Secondary | ICD-10-CM | POA: Diagnosis present

## 2019-01-03 DIAGNOSIS — Z23 Encounter for immunization: Secondary | ICD-10-CM | POA: Diagnosis not present

## 2019-01-03 DIAGNOSIS — K08409 Partial loss of teeth, unspecified cause, unspecified class: Secondary | ICD-10-CM | POA: Diagnosis present

## 2019-01-03 DIAGNOSIS — J90 Pleural effusion, not elsewhere classified: Secondary | ICD-10-CM | POA: Diagnosis not present

## 2019-01-03 DIAGNOSIS — Z87891 Personal history of nicotine dependence: Secondary | ICD-10-CM | POA: Diagnosis not present

## 2019-01-03 DIAGNOSIS — Z825 Family history of asthma and other chronic lower respiratory diseases: Secondary | ICD-10-CM | POA: Diagnosis not present

## 2019-01-03 DIAGNOSIS — J811 Chronic pulmonary edema: Secondary | ICD-10-CM | POA: Diagnosis not present

## 2019-01-03 DIAGNOSIS — R918 Other nonspecific abnormal finding of lung field: Secondary | ICD-10-CM | POA: Diagnosis not present

## 2019-01-03 DIAGNOSIS — T827XXA Infection and inflammatory reaction due to other cardiac and vascular devices, implants and grafts, initial encounter: Secondary | ICD-10-CM

## 2019-01-03 DIAGNOSIS — Z950 Presence of cardiac pacemaker: Secondary | ICD-10-CM | POA: Diagnosis not present

## 2019-01-03 DIAGNOSIS — E890 Postprocedural hypothyroidism: Secondary | ICD-10-CM | POA: Diagnosis present

## 2019-01-03 DIAGNOSIS — Z20828 Contact with and (suspected) exposure to other viral communicable diseases: Secondary | ICD-10-CM | POA: Diagnosis present

## 2019-01-03 DIAGNOSIS — I517 Cardiomegaly: Secondary | ICD-10-CM | POA: Diagnosis not present

## 2019-01-03 DIAGNOSIS — I4821 Permanent atrial fibrillation: Secondary | ICD-10-CM | POA: Diagnosis present

## 2019-01-03 DIAGNOSIS — I442 Atrioventricular block, complete: Secondary | ICD-10-CM | POA: Diagnosis present

## 2019-01-03 DIAGNOSIS — Z885 Allergy status to narcotic agent status: Secondary | ICD-10-CM | POA: Diagnosis not present

## 2019-01-03 DIAGNOSIS — Z01812 Encounter for preprocedural laboratory examination: Secondary | ICD-10-CM | POA: Insufficient documentation

## 2019-01-03 DIAGNOSIS — Y831 Surgical operation with implant of artificial internal device as the cause of abnormal reaction of the patient, or of later complication, without mention of misadventure at the time of the procedure: Secondary | ICD-10-CM | POA: Diagnosis present

## 2019-01-03 DIAGNOSIS — I4891 Unspecified atrial fibrillation: Secondary | ICD-10-CM

## 2019-01-03 LAB — POCT INR: INR: 1.8 — AB (ref 2.0–3.0)

## 2019-01-03 LAB — SARS CORONAVIRUS 2 (TAT 6-24 HRS): SARS Coronavirus 2: NEGATIVE

## 2019-01-03 NOTE — Progress Notes (Signed)
[address]       [place] [address]             [phone]   Telehealth Visit     Virtual Visit via Telephone Note   This visit type was conducted due to national recommendations for restrictions regarding the COVID-19 Pandemic (e.g. social distancing) in an effort to limit this patient's exposure and mitigate transmission in our community.  Due to her co-morbid illnesses, this patient is at least at moderate risk for complications without adequate follow up.  This format is felt to be most appropriate for this patient at this time.  The patient did not have access to video technology/had technical difficulties with video requiring transitioning to audio format only (telephone).  All issues noted in this document were discussed and addressed.  No physical exam could be performed with this format.                   [facility] Record [id number] Date of Birth: 09-03-1955  Referring: Evans Lance, MD Primary Care: Celene Squibb, MD Primary Cardiologist: Carlyle Dolly, MD  Chief Complaint: Infected pacemaker generator system, requested by Dr. Lovena Le to assist in removal,   History of Present Illness:    Ana Martin 63 y.o. female history has been reviewed with Dr. Lovena Le and x-ray findings reviewed.  While being cared for in [facility] the patient had a biventricular pacer , AICD placed in [facility] with a left thoracotomy.  Coronary sinus catheter could not be placed at the time.  The patient then had a generator change done Jane 25th 2020.  Subsequently the pocket has become infected, patient was seen Dr. Lovena Le to consider removal.  On review of her CT scans done in the past she is also incidentally noted to have multiple small pulmonary nodules, in July 2019.  A follow-up CT scan was recommended but never done  Previously the patient had a mitral valve replacement by Dr. Ernesto Rutherford with a mechanical mitral valve valve,   Current Activity/ Functional Status:  Patient is independent with mobility/ambulation, transfers, ADL's, IADL's.   Zubrod Score: At the time of surgery this patient's most appropriate activity status/level should be described as: []     0    Normal activity, no symptoms [x]     1    Restricted in physical strenuous activity but ambulatory, able to do out light work []     2    Ambulatory and capable of self care, unable to do work activities, up and about               >50 % of waking hours                              []     3    Only limited self care, in bed greater than 50% of waking hours []     4    Completely disabled, no self care, confined to bed or chair []     5    Moribund   Past Medical History:  Diagnosis Date  . Anxiety   . Atrial fibrillation ([place])   . Automatic implantable cardiac defibrillator in situ    Lumax DR-T 09-20-2007 Dr.Akbary  . Depression   . Hyperlipidemia   . 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 Hypertension   .  Lump or mass in breast    at the 7 o'clock position of right breast(non-tender)  . Other abnormal glucose   . Other left bundle branch block   . Other primary cardiomyopathies   . Thyroid cancer ([place])   . Thyroid disease   . Unspecified vitamin D deficiency   . Wolff-Parkinson-White syndrome     Past Surgical History:  Procedure Laterality Date  . ABDOMINAL HYSTERECTOMY    . BIV ICD GENERATOR CHANGEOUT N/A 07/29/2018   Procedure: BIV ICD GENERATOR CHANGEOUT;  Surgeon: Thompson Grayer, MD;  Location: [facility];  Service: Cardiovascular;  Laterality: N/A;  . CARDIAC VALVE REPLACEMENT     st jude silzone mitray mechanical [id number] ss# [id number]  . COLONOSCOPY     April 12, 2008  . PACEMAKER PLACEMENT      Family History  Problem Relation Age of Onset  . Lung cancer Father        expired 59  . Sarcoidosis Mother        espired 2013  . COPD Mother      Social History   Tobacco Use  Smoking  Status Former Smoker  . Packs/day: 1.00  . Years: 25.00  . Pack years: 25.00  . Types: Cigarettes  . Quit date: 10/17/2009  . Years since quitting: 9.2  Smokeless Tobacco Never Used    Social History   Substance and Sexual Activity  Alcohol Use No     Allergies  Allergen Reactions  . Codeine Nausea And Vomiting    Stomach cramp    Current Outpatient Medications  Medication Sig Dispense Refill  . acetaminophen (TYLENOL) 500 MG tablet Take 1,000 mg by mouth every 8 (eight) hours as needed for moderate pain or headache.    . albuterol (PROAIR HFA) 108 (90 Base) MCG/ACT inhaler Inhale 1-2 puffs into the lungs every 4 (four) hours as needed for wheezing or shortness of breath.     . ALPRAZolam (XANAX) 0.5 MG tablet Take 0.5 mg by mouth 2 (two) times daily.   0  . amiodarone (PACERONE) 200 MG tablet Take 1 tablet (200 mg total) by mouth daily. (Patient taking differently: Take 200 mg by mouth at bedtime. ) 30 tablet 0  . aspirin EC 81 MG tablet Take 81 mg by mouth at bedtime.     Marland Kitchen atorvastatin (LIPITOR) 40 MG tablet Take 40 mg by mouth at bedtime. for cholesterol  0  . budesonide-formoterol (SYMBICORT) 160-4.5 MCG/ACT inhaler Inhale 2 puffs into the lungs 2 (two) times daily as needed (shortness of breath).     . carvedilol (COREG) 12.5 MG tablet Take 12.5 mg by mouth 2 (two) times daily with a meal.    . cephALEXin (KEFLEX) 500 MG capsule Take 1 capsule (500 mg total) by mouth 2 (two) times daily. 20 capsule 1  . fluticasone (FLONASE) 50 MCG/ACT nasal spray Place 1 spray into both nostrils daily as needed for allergies or rhinitis.    . furosemide (LASIX) 40 MG tablet Take 1 tablet (40 mg total) by mouth daily. (Patient taking differently: Take 20-40 mg by mouth daily. ) 30 tablet 0  . levothyroxine (SYNTHROID) 200 MCG tablet Take 200 mcg by mouth daily.     Marland Kitchen losartan (COZAAR) 25 MG tablet Take 25 mg by mouth at bedtime.   4  . Melatonin 3 MG CAPS Take 3-6 mg by mouth at bedtime as  needed (sleep).    . metFORMIN (GLUCOPHAGE-XR) 500 MG 24 hr tablet Take 500  mg by mouth daily.     . potassium chloride SA (K-DUR) 20 MEQ tablet Take 10-20 mEq by mouth daily. Take 10 meq when taking 20 mg of lasix, take 20 meq when taking 40 mg of lasix    . promethazine (PHENERGAN) 25 MG tablet Take 25 mg by mouth at bedtime.     Marland Kitchen spironolactone (ALDACTONE) 25 MG tablet TAKE 1/2 TABLET BY MOUTH DAILY (Patient taking differently: Take 12.5 mg by mouth daily. ) 45 tablet 0  . venlafaxine XR (EFFEXOR-XR) 150 MG 24 hr capsule Take 150 mg by mouth at bedtime.    . Vitamin D, Ergocalciferol, (DRISDOL) 1.25 MG (50000 UT) CAPS capsule Take 50,000 Units by mouth every Monday.     . warfarin (COUMADIN) 2.5 MG tablet TAKE 1 TABLET BY MOUTH DAILY EXCEPT ON TUESDAY AND FRIDAYS TAKE ONE-HALF TABLET (Patient taking differently: Take 1.25-2.5 mg by mouth See admin instructions. Take 2.5 mg every night except Fridays take 1.25 mg at night) 90 tablet 1   No current facility-administered medications for this visit.     Pertinent items are noted in HPI.   Review of Systems:     Cardiac Review of Systems: [Y] = yes  or   [ N ] = no   Chest Pain Florencio.Farrier    ]  Resting SOB [  y ] Exertional SOB  Blue.Reese  ]  Orthopnea Florencio.Farrier  ]   Pedal Edema [ y  ]    Palpitations [ y ] Syncope  [ n ]   Presyncope [n   ]   General Review of Systems: [Y] = yes [  ]=no Constitional: recent weight change [  ];  Wt loss over the last 3 months [   ] anorexia [  ]; fatigue [  ]; nausea [  ]; night sweats [  ]; fever [  ]; or chills [  ];           Eye : blurred vision [  ]; diplopia [   ]; vision changes [  ];  Amaurosis fugax[  ]; Resp: cough [  ];  wheezing[  ];  hemoptysis[  ]; shortness of breath[  ]; paroxysmal nocturnal dyspnea[  ]; dyspnea on exertion[  ]; or orthopnea[  ];  GI:  gallstones[  ], vomiting[  ];  dysphagia[  ]; melena[  ];  hematochezia [  ]; heartburn[  ];   Hx of  Colonoscopy[  ]; GU: kidney stones [  ]; hematuria[  ];    dysuria [  ];  nocturia[  ];  history of     obstruction [  ]; urinary frequency [  ]             Skin: rash, swelling[  ];, hair loss[  ];  peripheral edema[  ];  or itching[  ]; Musculosketetal: myalgias[  ];  joint swelling[  ];  joint erythema[  ];  joint pain[  ];  back pain[  ];  Heme/Lymph: bruising[  ];  bleeding[  ];  anemia[  ];  Neuro: TIA[  ];  headaches[  ];  stroke[  ];  vertigo[  ];  seizures[  ];   paresthesias[  ];  difficulty walking[  ];  Psych:depression[  ]; anxiety[  ];  Endocrine: diabetes[  ];  thyroid dysfunction[  ];  Immunizations: Flu up to date [ y ]; Pneumococcal up to date [ y ];  Other:     PHYSICAL EXAMINATION:  There were no vitals taken for this visit. No exam  Diagnostic Studies & Laboratory data:     Recent Radiology Findings:    Ct Chest Wo Contrast  Result Date: 01/03/2019 CLINICAL DATA:  Follow-up pulmonary nodules. Former smoker. History of thyroidectomy for thyroid cancer. EXAM: CT CHEST WITHOUT CONTRAST TECHNIQUE: Multidetector CT imaging of the chest was performed following the standard protocol without IV contrast. COMPARISON:  08/31/2017 CT chest. FINDINGS: Cardiovascular: Mild cardiomegaly. No significant pericardial effusion/thickening. Marked wall valve prosthesis in place. 2 lead left subclavian ICD is noted with lead tips in the right atrium and right ventricular apex, with 2 separate left chest wall leads terminating in the left epicardial space. Three-vessel coronary atherosclerosis. Atherosclerotic nonaneurysmal thoracic aorta. Stable dilated main pulmonary artery (4.0 cm diameter). Mediastinum/Nodes: Total thyroidectomy. Unremarkable esophagus. No axillary adenopathy. Chronic mild right paratracheal adenopathy up to 1.3 cm (series 2/image 54), stable. No new pathologically enlarged mediastinal nodes. No discrete hilar adenopathy on this noncontrast scan. Lungs/Pleura: No pneumothorax. No pleural effusion. Mild centrilobular and  paraseptal emphysema with diffuse bronchial wall thickening. No acute consolidative airspace disease or lung masses. Solid 7 mm medial apical left upper lobe pulmonary nodule (series 8/image 31), stable since 08/22/2017 chest CT. No additional significant pulmonary nodules. Innumerable coarsely calcified subcentimeter granulomas scattered in both lungs are unchanged. Patchy ground-glass centrilobular micronodularity throughout both lungs. Upper abdomen: Cholecystectomy. Stable 1.7 cm left adrenal nodule with density -29 HU, stable 1.6 cm left adrenal nodule with density -72 HU and separate stable 2.0 cm left adrenal nodule with density -100 HU, compatible with benign myelolipomas. Musculoskeletal: No aggressive appearing focal osseous lesions. Mild lower thoracic spondylosis. Chronic moderate L1 vertebral compression fracture. Intact sternotomy wires. IMPRESSION: 1. Solid 7 mm medial apical left upper lobe pulmonary nodule is stable since 08/30/2017 chest CT, considered benign. No additional significant pulmonary nodules. Innumerable subcentimeter calcified granulomas in both lungs are unchanged. 2. Chronic mild right paratracheal adenopathy, most compatible with benign reactive etiology. 3. Cardiomegaly.  Three-vessel coronary atherosclerosis. 4. Stable prominently dilated main pulmonary artery, compatible with chronic pulmonary arterial hypertension. 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 5. Stable left adrenal myelolipomas. Aortic Atherosclerosis (ICD10-I70.0) and Emphysema (ICD10-J43.9). Electronically Signed   By: Ilona Sorrel M.D.   On: 01/03/2019 16:33    I have independently reviewed the above radiology studies  and reviewed the findings with the patient.   CLINICAL DATA:  Found down, bruising to chest  EXAM: CT CHEST, ABDOMEN AND PELVIS WITHOUT CONTRAST   TECHNIQUE: Multidetector CT imaging of the chest, abdomen and pelvis was performed following the standard protocol without IV contrast.  COMPARISON:  None.  FINDINGS: CT CHEST FINDINGS  Cardiovascular: Cardiomegaly.  No pericardial effusion.  Prosthetic mitral valve.  Left chest ICD.  No evidence of thoracic aortic aneurysm. Atherosclerotic calcifications of the aortic arch.  Three vessel coronary atherosclerosis.  Mediastinum/Nodes: No suspicious mediastinal or axillary lymphadenopathy.  Thyroid is absent.  Lungs/Pleura: Evaluation of the lung parenchyma is constrained by respiratory motion.  Numerous pulmonary nodules bilaterally, most of which reflect calcified granulomata, benign.  However, a dominant 7 mm noncalcified nodule is present in the medial left upper lobe (series 4/image 40). Additional 4 mm noncalcified nodule in the posterior left upper lobe (series 4/image 42). 8 x 5 mm nodule in the superior segment left lower lobe (series 4/image 61).  Faint ground-glass centrilobular nodularity in the bilateral upper lobes.  No focal consolidation. Trace right pleural effusion. No pneumothorax.  Musculoskeletal: No fracture is seen.  Median sternotomy.  CT ABDOMEN PELVIS FINDINGS  Motion degraded images.  Hepatobiliary: Unenhanced liver is grossly unremarkable.  Status post cholecystectomy. No intrahepatic or extrahepatic ductal dilatation.  Pancreas: Within normal limits.  Spleen: Within normal limits.  Adrenals/Urinary Tract: Adrenal glands are within normal limits.  Kidneys are within normal limits. No renal, ureteral, or bladder calculi. No hydronephrosis.  Bladder is within normal limits.  Stomach/Bowel: Stomach is within normal limits.  No evidence of bowel obstruction.  Normal appendix (series 3/image 102).  Mild sigmoid diverticulosis, without evidence of diverticulitis.  Vascular/Lymphatic: No evidence of  abdominal aortic aneurysm.  Atherosclerotic calcifications of the abdominal aorta and branch vessels.  No suspicious abdominopelvic lymphadenopathy.  Reproductive: Status post hysterectomy.  No adnexal masses.  Other: No abdominopelvic ascites.  No hemoperitoneum or free air.  Musculoskeletal: Mild superior endplate changes at L1.  No fracture is seen.  IMPRESSION: Motion degraded images.  No evidence of traumatic injury to the abdomen/pelvis.  Numerous bilateral pulmonary nodules, some of which reflect benign calcified granulomata, although dominant noncalcified nodules in the left lung measure up to 7 mm, poorly evaluated on the current motion-degraded study. Non-contrast chest CT at 3-6 months is recommended. If the nodules are stable at time of repeat CT, then future CT at 18-24 months (from today's scan) is considered optional for low-risk patients, but is recommended for high-risk patients. This recommendation follows the consensus statement: Guidelines for Management of Incidental Pulmonary Nodules Detected on CT Images: From the Fleischner Society 2017; Radiology 2017; 284:228-243.   Electronically Signed   By: Julian Hy M.D.   On: 08/31/2017 00:23  I have independently reviewed the above radiology studies  and reviewed the findings with the patient.   Recent Lab Findings: Lab Results  Component Value Date   WBC 11.2 (H) 12/28/2018   HGB 14.7 12/28/2018   HCT 46.5 12/28/2018   PLT 210 12/28/2018   GLUCOSE 89 12/28/2018   ALT 17 11/23/2017   AST 26 11/23/2017   NA 141 12/28/2018   K 4.5 12/28/2018   CL 98 12/28/2018   CREATININE 0.84 12/28/2018   BUN 13 12/28/2018   CO2 26 12/28/2018   TSH 4.139 09/05/2017   INR 1.8 (A) 01/03/2019      Assessment / Plan:    I have reviewed with the patient the planned left minithoracotomy for removal of epicardial pacing wires, because of the underlying infection of the pacemaker device.   Risks and options of the procedure including bleeding have been discussed with her.  Her INR is now 1.8, managed through the cardiology office she is starting Lovenox shots tonight  Follow-up CT scan of the chest which was recommended in 2019 was also done today - stable lung nodules   Grace Isaac MD      [address] [place] [address] Office [phone]     01/03/2019 5:49 PM

## 2019-01-03 NOTE — Patient Instructions (Addendum)
November 27: Last dose of Coumadin.  November 28, 29: No Coumadin or Lovenox.  November 30: Inject Lovenox 100 mg in the fatty abdominal tissue at least 2 inches from the belly button at 8pm rotate sites. No Coumadin.  December 1: Inject Lovenox in the fatty tissue at 8 am.  December 2: Procedure Day

## 2019-01-04 ENCOUNTER — Encounter (HOSPITAL_COMMUNITY): Payer: Self-pay

## 2019-01-04 NOTE — Anesthesia Preprocedure Evaluation (Addendum)
Anesthesia Evaluation  Patient identified by MRN, date of birth, ID band Patient awake    Reviewed: Allergy & Precautions, NPO status , Patient's Chart, lab work & pertinent test results  History of Anesthesia Complications (+) PONV and history of anesthetic complications  Airway Mallampati: II  TM Distance: >3 FB Neck ROM: Full    Dental  (+) Edentulous Upper, Dental Advisory Given   Pulmonary asthma , COPD, former smoker,    Pulmonary exam normal        Cardiovascular hypertension, Pt. on medications and Pt. on home beta blockers + dysrhythmias Atrial Fibrillation + pacemaker + Cardiac Defibrillator  Rhythm:Regular Rate:Normal  MVR Echo (limited) 12/02/17: Study Conclusions - Limited study with echocontrast to evaluate LV function. - Left ventricle: Systolic function was moderately reduced. The estimated ejection fraction was in the range of 35% to 40%. Diffuse hypokinesis.    Neuro/Psych PSYCHIATRIC DISORDERS Anxiety Depression negative neurological ROS     GI/Hepatic negative GI ROS, Neg liver ROS,   Endo/Other  diabetesHypothyroidism   Renal/GU negative Renal ROS     Musculoskeletal negative musculoskeletal ROS (+)   Abdominal   Peds  Hematology negative hematology ROS (+)   Anesthesia Other Findings Day of surgery medications reviewed with the patient.  Reproductive/Obstetrics                           Anesthesia Physical Anesthesia Plan  ASA: IV  Anesthesia Plan: General   Post-op Pain Management:    Induction: Intravenous  PONV Risk Score and Plan: 4 or greater and Ondansetron, Dexamethasone, Midazolam and Treatment may vary due to age or medical condition  Airway Management Planned: Double Lumen EBT  Additional Equipment: Arterial line, 3D TEE, TEE and CVP  Intra-op Plan:   Post-operative Plan: Possible Post-op intubation/ventilation  Informed Consent: I  have reviewed the patients History and Physical, chart, labs and discussed the procedure including the risks, benefits and alternatives for the proposed anesthesia with the patient or authorized representative who has indicated his/her understanding and acceptance.     Dental advisory given  Plan Discussed with: Anesthesiologist and CRNA  Anesthesia Plan Comments: (PAT note written 01/04/2019 by Myra Gianotti, PA-C. )    Anesthesia Quick Evaluation

## 2019-01-04 NOTE — Progress Notes (Signed)
PCP - Dr. Earnest Conroy. hall  Cardiologist - Dr. Harl Bowie  Chest x-ray - 01/05/2019  EKG - 01/05/2019  Stress Test - Denies  ECHO - 12/02/17 (E)  Cardiac Cath - Denies  AICD- Y- Medtronic PM-Y- Medtronic LOOP-na  Sleep Study - Denies CPAP - Denies  LABS- 01/03/2019: COVID- NEG 01/05/2019: CBC, CMP, ABG, PT, PTT, UA, T/S, PREPARE RBC'S, PCR  ASA- LD- 11/30 Coumadin- LD- 11/27- Lovenox bridge started  ERAS- No  HA1C-01/05/2019 Fasting Blood Sugar - unknown Checks Blood Sugar __0___ times a day- Pt sts she was never offered a meter to check her bs at home. She sts she has been using her friend's meter every now and then, but she would like her own. Advised pt to contact her pcp about getting supplies.  Anesthesia- Yes- cardiac history  Pt denies having chest pain, sob, or fever during the pre-op phone call. All instructions explained to the pt, with a verbal understanding including: as of today, stop taking all Aspirin (unless instructed by your doctor) and Other Aspirin containing products, Vitamins, Fish oils, and Herbal medications. Also stop all NSAIDS i.e. Advil, Ibuprofen, Motrin, Aleve, Anaprox, Naproxen, BC, Goody Powders, and all Supplements. Pt also instructed to self quarantine after being tested for COVID-19. The opportunity to ask questions was provided.   Coronavirus Screening  Have you experienced the following symptoms:  Cough yes/no: No Fever (>100.40F)  yes/no: No Runny nose yes/no: No Sore throat yes/no: No Difficulty breathing/shortness of breath  yes/no: No  Have you or a family member traveled in the last 14 days and where? yes/no: No   If the patient indicates "YES" to the above questions, their PAT will be rescheduled to limit the exposure to others and, the surgeon will be notified. THE PATIENT WILL NEED TO BE ASYMPTOMATIC FOR 14 DAYS.   If the patient is not experiencing any of these symptoms, the PAT nurse will instruct them to NOT bring anyone with them to  their appointment since they may have these symptoms or traveled as well.   Please remind your patients and families that hospital visitation restrictions are in effect and the importance of the restrictions.

## 2019-01-04 NOTE — Progress Notes (Signed)
Anesthesia Chart Review: Ana Martin   Case: [id number] Date/Time: 01/05/19 0815   Procedures:      - MEDTRONIC BIV  ICD SYSTEM REMOVAL AND TEMPORARY PACEMAKER PLACEMENT (N/A Chest)     EPICARDIAL PACING LEAD REMOVAL (N/A )     MINI/LIMITED THORACOTOMY (Left )   Anesthesia type: General   Pre-op diagnosis: ICD SYSTEM INFECTION   Location: [facility] OR ROOM 14 / [place] [facility]   Surgeon: Evans Lance, MD; Grace Isaac, MD      DISCUSSION: Patient is a 63 year old female scheduled for the above procedure. She is s/p ICD generator change on 07/29/18 and developed pocket infection. She is pacer dependent by notes. Started on Lovenox bridge 01/04/19 for mechanical MVR history.    History includes former smoker (quit 10/17/09), post-operative N/V, cardiomyopathy, chronic systolic CHF, MR (s/p mechanical mitral valve by Dr. Ernesto Rutherford, 1999), ICD (placed 2009; BiV upgrade via left mini thoracotomy 04/29/11, generator change 07/29/18, Medtronic DTPB2D1; pacer dependent by notes), WPW (s/p surgical ablation 1984), afib/flutter (s/p ablation 08/22/15, Dr. Mahala Menghini), left BBB, HTN, HLD, thyroid cancer (s/p thyroidectomy), COPD, asthma, DM2, seizure (12/2012, presumably from benzodiazepine withdrawal), breast mass (2013, not otherwise specified), depression. Stable pulmonary nodules and stable prominent pulmonary artery on 01/03/19 CT.   INR 1.8 01/03/19 and was started on Lovenox bridge for surgery.  01/03/19 COVID-19 test negative. She is for updated labs and anesthesia team evaluation on the day of surgery.    VS: Ht 5\' 8"  (1.727 m)   Wt 88.9 kg   BMI 29.80 kg/m   PROVIDERS: Celene Squibb, MD is listed as PCP. Notes in [place] indicate that she is getting re-established with Yong Channel, MD on 01/12/19.  Carlyle Dolly, MD is primary cardiologist. Last visit 09/06/18. Thompson Grayer, MD is EP cardiologist   LABS: She is for updated labs including T&S/Prepare PRBC on the day of  surgery. Lab results as of 12/28/18 included: Lab Results  Component Value Date   WBC 11.2 (H) 12/28/2018   HGB 14.7 12/28/2018   HCT 46.5 12/28/2018   PLT 210 12/28/2018   GLUCOSE 89 12/28/2018   ALT 17 11/23/2017   AST 26 11/23/2017   NA 141 12/28/2018   K 4.5 12/28/2018   CL 98 12/28/2018   CREATININE 0.84 12/28/2018   BUN 13 12/28/2018   CO2 26 12/28/2018   INR 1.8 (A) 01/03/2019     IMAGES: CT Chest 01/03/19: IMPRESSION: 1. Solid 7 mm medial apical left upper lobe pulmonary nodule is stable since 08/30/2017 chest CT, considered benign. No additional significant pulmonary nodules. Innumerable subcentimeter calcified granulomas in both lungs are unchanged. 2. Chronic mild right paratracheal adenopathy, most compatible with benign reactive etiology. 3. Cardiomegaly.  Three-vessel coronary atherosclerosis. 4. Stable prominently dilated main pulmonary artery, compatible with chronic pulmonary arterial hypertension. 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 5. Stable left adrenal myelolipomas. Aortic Atherosclerosis (ICD10-I70.0) and Emphysema (ICD10-J43.9).   EKG: 12/28/18: Ventricular paced rhythm.  Biventricular pacemaker detected.   CV: EP ICD Implant/exchnage 07/29/18: CONCLUSIONS:   1. Nonischemic cardiomyopathy with chronic New York Heart Association class III heart failure, complete heart block, and prior BiV ICD implantation  2. BiV ICD at elective replacement indicator  3. Successful BiV ICD generator change with a Medtronic Cobalt HF CRT-D MRI Surescan model DTPB2D1 (SN [id number]) BiV ICD.  4. No early apparent complications.  Echo (limited) 12/02/17: Study Conclusions - Limited study with echocontrast to evaluate LV function. - Left ventricle: Systolic function was moderately reduced. The   estimated ejection fraction  was in the range of 35% to 40%.   Diffuse hypokinesis.   Echo 11/26/17: Study Conclusions - Left ventricle: The cavity size was normal. Wall thickness was   increased in a pattern of mild LVH. Systolic function was   moderately reduced. The estimated ejection fraction was in the   range of 35% to 40%. Unable to determine diastolic function in   setting of MVR. - Mitral valve: There is a mechanical valve unknown type or size in   the MV position. - Left atrium: The atrium was severely dilated. - Right ventricle: Poorly visualized. TAPSE would suggest mild   systolic dysfunction. TAPSE: 14.1 mm . - Technically difficult study. Impressions: - LVEF appears to be around 35-40%. Some limitation in the   visualization of the endocardium, consider limited study with   echocontrast   Past Medical History:  Diagnosis Date  . AICD (automatic cardioverter/defibrillator) present   . Anxiety   . Asthma   . Atrial fibrillation ([place])   . Automatic implantable cardiac defibrillator in situ    [place]umax DR-T 09-20-2007 Dr.Akbary  . COPD (chronic obstructive pulmonary disease) ([place])   . Depression   . Diabetes mellitus without complication ([place])    Type II  . Hyperlipidemia   . 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 Hypertension   . Lump or mass in breast    at the 7 o'clock position of right breast(non-tender)  . Other abnormal glucose   . Other left bundle branch block   . Other primary cardiomyopathies   . PONV (postoperative nausea and vomiting)   . Presence of permanent cardiac pacemaker   . Seizure ([place])   . Thyroid cancer ([place])   . Thyroid disease   . Unspecified vitamin D deficiency   . Wolff-Parkinson-White syndrome     Past Surgical History:  Procedure Laterality Date  . ABDOMINAL HYSTERECTOMY    . BIV ICD GENERATOR CHANGEOUT N/A 07/29/2018   Procedure: BIV ICD GENERATOR CHANGEOUT;  Surgeon: Thompson Grayer, MD;  Location: [facility];   Service: Cardiovascular;  Laterality: N/A;  . CARDIAC VALVE REPLACEMENT     st jude silzone mitray mechanical [id number] ss# [id number]  . CHOLECYSTECTOMY    . COLONOSCOPY     April 12, 2008  . JOINT REPLACEMENT    . PACEMAKER PLACEMENT    . SHOULDER SURGERY     Left    MEDICATIONS: No current facility-administered medications for this encounter.    Marland Kitchen acetaminophen (TYLENOL) 500 MG tablet  . albuterol (PROAIR HFA) 108 (90 Base) MCG/ACT inhaler  . ALPRAZolam (XANAX) 0.5 MG tablet  . amiodarone (PACERONE) 200 MG tablet  . aspirin EC 81 MG tablet  . atorvastatin (LIPITOR) 40 MG tablet  . budesonide-formoterol (SYMBICORT) 160-4.5 MCG/ACT inhaler  . carvedilol (COREG) 12.5 MG tablet  . cephALEXin (KEFLEX) 500 MG capsule  . fluticasone (FLONASE) 50 MCG/ACT nasal spray  . furosemide (LASIX) 40 MG tablet  . levothyroxine (SYNTHROID) 200 MCG tablet  . losartan (COZAAR) 25 MG tablet  . Melatonin 3 MG CAPS  . metFORMIN (GLUCOPHAGE-XR) 500 MG 24 hr tablet  . potassium chloride SA (K-DUR) 20 MEQ tablet  . promethazine (PHENERGAN) 25 MG tablet  . spironolactone (ALDACTONE) 25 MG tablet  . venlafaxine XR (EFFEXOR-XR) 150 MG 24 hr  capsule  . Vitamin D, Ergocalciferol, (DRISDOL) 1.25 MG (50000 UT) CAPS capsule  . warfarin (COUMADIN) 2.5 MG tablet    Myra Gianotti, PA-C Surgical Short Stay/Anesthesiology [facility] Phone [phone] [facility] Phone [phone] 01/04/2019 1:33 PM

## 2019-01-05 ENCOUNTER — Inpatient Hospital Stay (HOSPITAL_COMMUNITY)
Admission: RE | Admit: 2019-01-05 | Discharge: 2019-01-14 | DRG: 226 | Disposition: A | Discharge status: Home Health | Payer: PPO | Attending: Internal Medicine | Admitting: Internal Medicine

## 2019-01-05 ENCOUNTER — Inpatient Hospital Stay (HOSPITAL_COMMUNITY): Payer: PPO | Admitting: Anesthesiology

## 2019-01-05 ENCOUNTER — Inpatient Hospital Stay (HOSPITAL_COMMUNITY): Payer: PPO

## 2019-01-05 ENCOUNTER — Encounter (HOSPITAL_COMMUNITY)
Admission: RE | Disposition: A | Discharge status: Home Health | Payer: Self-pay | Source: Home / Self Care | Attending: Internal Medicine

## 2019-01-05 ENCOUNTER — Other Ambulatory Visit: Payer: Self-pay

## 2019-01-05 DIAGNOSIS — I5023 Acute on chronic systolic (congestive) heart failure: Secondary | ICD-10-CM | POA: Diagnosis not present

## 2019-01-05 DIAGNOSIS — Z95810 Presence of automatic (implantable) cardiac defibrillator: Secondary | ICD-10-CM

## 2019-01-05 DIAGNOSIS — Z23 Encounter for immunization: Secondary | ICD-10-CM

## 2019-01-05 DIAGNOSIS — E119 Type 2 diabetes mellitus without complications: Secondary | ICD-10-CM | POA: Diagnosis present

## 2019-01-05 DIAGNOSIS — Z87891 Personal history of nicotine dependence: Secondary | ICD-10-CM

## 2019-01-05 DIAGNOSIS — Z09 Encounter for follow-up examination after completed treatment for conditions other than malignant neoplasm: Secondary | ICD-10-CM

## 2019-01-05 DIAGNOSIS — Z825 Family history of asthma and other chronic lower respiratory diseases: Secondary | ICD-10-CM

## 2019-01-05 DIAGNOSIS — K5900 Constipation, unspecified: Secondary | ICD-10-CM | POA: Diagnosis not present

## 2019-01-05 DIAGNOSIS — I428 Other cardiomyopathies: Secondary | ICD-10-CM | POA: Diagnosis present

## 2019-01-05 DIAGNOSIS — L989 Disorder of the skin and subcutaneous tissue, unspecified: Secondary | ICD-10-CM | POA: Diagnosis present

## 2019-01-05 DIAGNOSIS — E890 Postprocedural hypothyroidism: Secondary | ICD-10-CM | POA: Diagnosis present

## 2019-01-05 DIAGNOSIS — Z20828 Contact with and (suspected) exposure to other viral communicable diseases: Secondary | ICD-10-CM | POA: Diagnosis present

## 2019-01-05 DIAGNOSIS — R0602 Shortness of breath: Secondary | ICD-10-CM

## 2019-01-05 DIAGNOSIS — I442 Atrioventricular block, complete: Secondary | ICD-10-CM | POA: Diagnosis present

## 2019-01-05 DIAGNOSIS — Z9689 Presence of other specified functional implants: Secondary | ICD-10-CM

## 2019-01-05 DIAGNOSIS — Y831 Surgical operation with implant of artificial internal device as the cause of abnormal reaction of the patient, or of later complication, without mention of misadventure at the time of the procedure: Secondary | ICD-10-CM | POA: Diagnosis present

## 2019-01-05 DIAGNOSIS — Z950 Presence of cardiac pacemaker: Secondary | ICD-10-CM

## 2019-01-05 DIAGNOSIS — I447 Left bundle-branch block, unspecified: Secondary | ICD-10-CM | POA: Diagnosis present

## 2019-01-05 DIAGNOSIS — K08409 Partial loss of teeth, unspecified cause, unspecified class: Secondary | ICD-10-CM | POA: Diagnosis present

## 2019-01-05 DIAGNOSIS — E785 Hyperlipidemia, unspecified: Secondary | ICD-10-CM | POA: Diagnosis present

## 2019-01-05 DIAGNOSIS — I110 Hypertensive heart disease with heart failure: Secondary | ICD-10-CM | POA: Diagnosis present

## 2019-01-05 DIAGNOSIS — I5022 Chronic systolic (congestive) heart failure: Secondary | ICD-10-CM | POA: Diagnosis not present

## 2019-01-05 DIAGNOSIS — T827XXA Infection and inflammatory reaction due to other cardiac and vascular devices, implants and grafts, initial encounter: Secondary | ICD-10-CM | POA: Diagnosis present

## 2019-01-05 DIAGNOSIS — Z952 Presence of prosthetic heart valve: Secondary | ICD-10-CM

## 2019-01-05 DIAGNOSIS — I4821 Permanent atrial fibrillation: Secondary | ICD-10-CM | POA: Diagnosis present

## 2019-01-05 DIAGNOSIS — Z85850 Personal history of malignant neoplasm of thyroid: Secondary | ICD-10-CM

## 2019-01-05 DIAGNOSIS — J449 Chronic obstructive pulmonary disease, unspecified: Secondary | ICD-10-CM | POA: Diagnosis present

## 2019-01-05 DIAGNOSIS — G8918 Other acute postprocedural pain: Secondary | ICD-10-CM

## 2019-01-05 DIAGNOSIS — Z9889 Other specified postprocedural states: Secondary | ICD-10-CM

## 2019-01-05 DIAGNOSIS — Z885 Allergy status to narcotic agent status: Secondary | ICD-10-CM | POA: Diagnosis not present

## 2019-01-05 DIAGNOSIS — D649 Anemia, unspecified: Secondary | ICD-10-CM | POA: Diagnosis not present

## 2019-01-05 DIAGNOSIS — I517 Cardiomegaly: Secondary | ICD-10-CM | POA: Diagnosis not present

## 2019-01-05 HISTORY — PX: ICD LEAD REMOVAL: SHX5855

## 2019-01-05 HISTORY — DX: Type 2 diabetes mellitus without complications: E11.9

## 2019-01-05 HISTORY — PX: THORACOTOMY: SHX5074

## 2019-01-05 HISTORY — DX: Unspecified convulsions: R56.9

## 2019-01-05 HISTORY — DX: Presence of cardiac pacemaker: Z95.0

## 2019-01-05 HISTORY — DX: Presence of automatic (implantable) cardiac defibrillator: Z95.810

## 2019-01-05 HISTORY — DX: Chronic obstructive pulmonary disease, unspecified: J44.9

## 2019-01-05 HISTORY — DX: Nausea with vomiting, unspecified: R11.2

## 2019-01-05 HISTORY — PX: EPICARDIAL PACING LEAD PLACEMENT: SHX6274

## 2019-01-05 HISTORY — DX: Unspecified asthma, uncomplicated: J45.909

## 2019-01-05 HISTORY — DX: Other specified postprocedural states: Z98.890

## 2019-01-05 LAB — POCT I-STAT 7, (LYTES, BLD GAS, ICA,H+H)
Acid-base deficit: 3 mmol/L — ABNORMAL HIGH (ref 0.0–2.0)
Acid-base deficit: 2 mmol/L (ref 0.0–2.0)
Bicarbonate: 25.6 mmol/L (ref 20.0–28.0)
Bicarbonate: 26.4 mmol/L (ref 20.0–28.0)
Calcium, Ion: 1.14 mmol/L — ABNORMAL LOW (ref 1.15–1.40)
Calcium, Ion: 1.14 mmol/L — ABNORMAL LOW (ref 1.15–1.40)
HCT: 44 % (ref 36.0–46.0)
HCT: 45 % (ref 36.0–46.0)
Hemoglobin: 15 g/dL (ref 12.0–15.0)
Hemoglobin: 15.3 g/dL — ABNORMAL HIGH (ref 12.0–15.0)
O2 Saturation: 90 %
O2 Saturation: 92 %
Patient temperature: 96.7
Patient temperature: 97.7
Potassium: 5.5 mmol/L — ABNORMAL HIGH (ref 3.5–5.1)
Potassium: 5.4 mmol/L — ABNORMAL HIGH (ref 3.5–5.1)
Sodium: 133 mmol/L — ABNORMAL LOW (ref 135–145)
Sodium: 134 mmol/L — ABNORMAL LOW (ref 135–145)
TCO2: 27 mmol/L (ref 22–32)
TCO2: 28 mmol/L (ref 22–32)
pCO2 arterial: 57 mmHg — ABNORMAL HIGH (ref 32.0–48.0)
pCO2 arterial: 59.1 mmHg — ABNORMAL HIGH (ref 32.0–48.0)
pH, Arterial: 7.254 — ABNORMAL LOW (ref 7.350–7.450)
pH, Arterial: 7.255 — ABNORMAL LOW (ref 7.350–7.450)
pO2, Arterial: 66 mmHg — ABNORMAL LOW (ref 83.0–108.0)
pO2, Arterial: 72 mmHg — ABNORMAL LOW (ref 83.0–108.0)

## 2019-01-05 LAB — COMPREHENSIVE METABOLIC PANEL
ALT: 24 U/L (ref 0–44)
AST: 26 U/L (ref 15–41)
Albumin: 3.9 g/dL (ref 3.5–5.0)
Alkaline Phosphatase: 57 U/L (ref 38–126)
Anion gap: 12 (ref 5–15)
BUN: 12 mg/dL (ref 8–23)
CO2: 28 mmol/L (ref 22–32)
Calcium: 9 mg/dL (ref 8.9–10.3)
Chloride: 96 mmol/L — ABNORMAL LOW (ref 98–111)
Creatinine, Ser: 0.93 mg/dL (ref 0.44–1.00)
GFR calc Af Amer: >60 mL/min (ref >60)
GFR calc non Af Amer: >60 mL/min (ref >60)
Glucose, Bld: 124 mg/dL — ABNORMAL HIGH (ref 70–99)
Potassium: 4.2 mmol/L (ref 3.5–5.1)
Sodium: 136 mmol/L (ref 135–145)
Total Bilirubin: 0.6 mg/dL (ref 0.3–1.2)
Total Protein: 7.5 g/dL (ref 6.5–8.1)

## 2019-01-05 LAB — ECHO INTRAOPERATIVE TEE

## 2019-01-05 LAB — CBC
HCT: 50 % — ABNORMAL HIGH (ref 36.0–46.0)
Hemoglobin: 15 g/dL (ref 12.0–15.0)
MCH: 26.4 pg (ref 26.0–34.0)
MCHC: 30 g/dL (ref 30.0–36.0)
MCV: 88 fL (ref 80.0–100.0)
Platelets: 180 10*3/uL (ref 150–400)
RBC: 5.68 MIL/uL — ABNORMAL HIGH (ref 3.87–5.11)
RDW: 18 % — ABNORMAL HIGH (ref 11.5–15.5)
WBC: 10.7 10*3/uL — ABNORMAL HIGH (ref 4.0–10.5)
nRBC: 0 % (ref 0.0–0.2)

## 2019-01-05 LAB — PREPARE RBC (CROSSMATCH)

## 2019-01-05 LAB — GLUCOSE, CAPILLARY
Glucose-Capillary: 139 mg/dL — ABNORMAL HIGH (ref 70–99)
Glucose-Capillary: 149 mg/dL — ABNORMAL HIGH (ref 70–99)
Glucose-Capillary: 209 mg/dL — ABNORMAL HIGH (ref 70–99)
Glucose-Capillary: 131 mg/dL — ABNORMAL HIGH (ref 70–99)

## 2019-01-05 LAB — BLOOD GAS, ARTERIAL
Acid-Base Excess: 3.4 mmol/L — ABNORMAL HIGH (ref 0.0–2.0)
Bicarbonate: 29.1 mmol/L — ABNORMAL HIGH (ref 20.0–28.0)
FIO2: 21
O2 Saturation: 98.7 %
Patient temperature: 37
pCO2 arterial: 59.5 mmHg — ABNORMAL HIGH (ref 32.0–48.0)
pH, Arterial: 7.311 — ABNORMAL LOW (ref 7.350–7.450)
pO2, Arterial: 138 mmHg — ABNORMAL HIGH (ref 83.0–108.0)

## 2019-01-05 LAB — URINALYSIS, ROUTINE W REFLEX MICROSCOPIC
Bilirubin Urine: NEGATIVE
Glucose, UA: NEGATIVE mg/dL
Hgb urine dipstick: NEGATIVE
Ketones, ur: NEGATIVE mg/dL
Leukocytes,Ua: NEGATIVE
Nitrite: NEGATIVE
Protein, ur: NEGATIVE mg/dL
Specific Gravity, Urine: 1.013 (ref 1.005–1.030)
pH: 5 (ref 5.0–8.0)

## 2019-01-05 LAB — SURGICAL PCR SCREEN
MRSA, PCR: NEGATIVE
Staphylococcus aureus: NEGATIVE

## 2019-01-05 LAB — PROTIME-INR
INR: 1.5 — ABNORMAL HIGH (ref 0.8–1.2)
Prothrombin Time: 17.7 seconds — ABNORMAL HIGH (ref 11.4–15.2)

## 2019-01-05 LAB — APTT: aPTT: 33 seconds (ref 24–36)

## 2019-01-05 LAB — ABO/RH: ABO/RH(D): O NEG

## 2019-01-05 SURGERY — REMOVAL, ELECTRODE LEAD, ICD
Anesthesia: General | Site: Chest

## 2019-01-05 MED ORDER — LACTATED RINGERS IV SOLN
INTRAVENOUS | Status: DC | PRN
  Administered 2019-01-05 (×2): via INTRAVENOUS

## 2019-01-05 MED ORDER — CHLORHEXIDINE GLUCONATE CLOTH 2 % EX PADS
6.0000 | MEDICATED_PAD | Freq: Every day | CUTANEOUS | Status: DC
  Administered 2019-01-06 – 2019-01-12 (×6): 6 via TOPICAL

## 2019-01-05 MED ORDER — SODIUM CHLORIDE 0.9 % IV SOLN
INTRAVENOUS | Status: DC | PRN
  Administered 2019-01-05: 500 mL

## 2019-01-05 MED ORDER — METOPROLOL TARTRATE 5 MG/5ML IV SOLN
5.0000 mg | Freq: Four times a day (QID) | INTRAVENOUS | Status: DC
  Administered 2019-01-05 – 2019-01-12 (×15): 5 mg via INTRAVENOUS
  Filled 2019-01-05 (×22): qty 5

## 2019-01-05 MED ORDER — ALPRAZOLAM 0.5 MG PO TABS
0.5000 mg | ORAL_TABLET | Freq: Two times a day (BID) | ORAL | Status: DC
  Administered 2019-01-06 – 2019-01-14 (×17): 0.5 mg via ORAL
  Filled 2019-01-05 (×17): qty 1

## 2019-01-05 MED ORDER — FLUTICASONE PROPIONATE 50 MCG/ACT NA SUSP
1.0000 | Freq: Every day | NASAL | Status: DC | PRN
  Filled 2019-01-05: qty 16

## 2019-01-05 MED ORDER — ONDANSETRON HCL 4 MG/2ML IJ SOLN
INTRAMUSCULAR | Status: DC | PRN
  Administered 2019-01-05 (×2): 4 mg via INTRAVENOUS

## 2019-01-05 MED ORDER — SUCCINYLCHOLINE CHLORIDE 200 MG/10ML IV SOSY
PREFILLED_SYRINGE | INTRAVENOUS | Status: DC | PRN
  Administered 2019-01-05: 160 mg via INTRAVENOUS

## 2019-01-05 MED ORDER — PHENYLEPHRINE HCL-NACL 10-0.9 MG/250ML-% IV SOLN
INTRAVENOUS | Status: DC | PRN
  Administered 2019-01-05: 12:00:00 via INTRAVENOUS
  Administered 2019-01-05: 30 ug/min via INTRAVENOUS

## 2019-01-05 MED ORDER — ALBUTEROL SULFATE (2.5 MG/3ML) 0.083% IN NEBU
2.5000 mg | INHALATION_SOLUTION | RESPIRATORY_TRACT | Status: DC | PRN
  Administered 2019-01-10: 2.5 mg via RESPIRATORY_TRACT
  Filled 2019-01-05: qty 3

## 2019-01-05 MED ORDER — SUCCINYLCHOLINE CHLORIDE 20 MG/ML IJ SOLN: INTRAMUSCULAR | Status: DC | PRN

## 2019-01-05 MED ORDER — FENTANYL 40 MCG/ML IV SOLN: INTRAVENOUS | Status: DC

## 2019-01-05 MED ORDER — MELATONIN 3 MG PO TABS
3.0000 mg | ORAL_TABLET | Freq: Every evening | ORAL | Status: DC | PRN
  Administered 2019-01-07: 3 mg via ORAL
  Administered 2019-01-10 – 2019-01-13 (×2): 6 mg via ORAL
  Filled 2019-01-05 (×5): qty 2

## 2019-01-05 MED ORDER — PROPOFOL 10 MG/ML IV BOLUS
INTRAVENOUS | Status: DC | PRN
  Administered 2019-01-05: 150 mg via INTRAVENOUS

## 2019-01-05 MED ORDER — ONDANSETRON HCL 4 MG/2ML IJ SOLN
4.0000 mg | Freq: Four times a day (QID) | INTRAMUSCULAR | Status: DC | PRN
  Administered 2019-01-06 – 2019-01-07 (×3): 4 mg via INTRAVENOUS
  Filled 2019-01-05 (×3): qty 2

## 2019-01-05 MED ORDER — ACETAMINOPHEN 325 MG PO TABS: 325.0000 mg | ORAL_TABLET | ORAL | Status: DC | PRN

## 2019-01-05 MED ORDER — PROPOFOL 10 MG/ML IV BOLUS
INTRAVENOUS | Status: AC
  Filled 2019-01-05: qty 40

## 2019-01-05 MED ORDER — SENNOSIDES-DOCUSATE SODIUM 8.6-50 MG PO TABS
1.0000 | ORAL_TABLET | Freq: Every day | ORAL | Status: DC
  Administered 2019-01-06 – 2019-01-12 (×7): 1 via ORAL
  Filled 2019-01-05 (×8): qty 1

## 2019-01-05 MED ORDER — PHENYLEPHRINE 40 MCG/ML (10ML) SYRINGE FOR IV PUSH (FOR BLOOD PRESSURE SUPPORT)
PREFILLED_SYRINGE | INTRAVENOUS | Status: AC
  Filled 2019-01-05: qty 10

## 2019-01-05 MED ORDER — SODIUM CHLORIDE 0.9 % IV SOLN: INTRAVENOUS | Status: DC

## 2019-01-05 MED ORDER — PROMETHAZINE HCL 25 MG/ML IJ SOLN: 6.2500 mg | INTRAMUSCULAR | Status: DC | PRN

## 2019-01-05 MED ORDER — CEPHALEXIN 500 MG PO CAPS: 500.0000 mg | ORAL_CAPSULE | Freq: Two times a day (BID) | ORAL | Status: DC

## 2019-01-05 MED ORDER — LACTATED RINGERS IV SOLN
INTRAVENOUS | Status: DC | PRN
  Administered 2019-01-05: 08:00:00 via INTRAVENOUS

## 2019-01-05 MED ORDER — ONDANSETRON HCL 4 MG/2ML IJ SOLN: 4.0000 mg | Freq: Four times a day (QID) | INTRAMUSCULAR | Status: DC | PRN

## 2019-01-05 MED ORDER — ASPIRIN EC 81 MG PO TBEC
81.0000 mg | DELAYED_RELEASE_TABLET | Freq: Every day | ORAL | Status: DC
  Administered 2019-01-06 – 2019-01-13 (×8): 81 mg via ORAL
  Filled 2019-01-05 (×8): qty 1

## 2019-01-05 MED ORDER — VITAMIN D (ERGOCALCIFEROL) 1.25 MG (50000 UNIT) PO CAPS
50000.0000 [IU] | ORAL_CAPSULE | ORAL | Status: DC
  Administered 2019-01-10: 50000 [IU] via ORAL
  Filled 2019-01-05: qty 1

## 2019-01-05 MED ORDER — ONDANSETRON HCL 4 MG/2ML IJ SOLN
INTRAMUSCULAR | Status: AC
  Filled 2019-01-05: qty 2

## 2019-01-05 MED ORDER — HYDRALAZINE HCL 20 MG/ML IJ SOLN
5.0000 mg | Freq: Four times a day (QID) | INTRAMUSCULAR | Status: DC | PRN
  Administered 2019-01-05 – 2019-01-07 (×2): 5 mg via INTRAVENOUS
  Filled 2019-01-05 (×2): qty 1

## 2019-01-05 MED ORDER — ONDANSETRON HCL 4 MG/2ML IJ SOLN
INTRAMUSCULAR | Status: AC
  Administered 2019-01-06: 4 mg via INTRAVENOUS
  Filled 2019-01-05: qty 2

## 2019-01-05 MED ORDER — LEVOTHYROXINE SODIUM 100 MCG PO TABS
200.0000 ug | ORAL_TABLET | Freq: Every day | ORAL | Status: DC
  Administered 2019-01-07 – 2019-01-14 (×8): 200 ug via ORAL
  Filled 2019-01-05: qty 2
  Filled 2019-01-05: qty 1
  Filled 2019-01-05 (×5): qty 2
  Filled 2019-01-05: qty 1
  Filled 2019-01-05: qty 2

## 2019-01-05 MED ORDER — FENTANYL CITRATE (PF) 100 MCG/2ML IJ SOLN: 25.0000 ug | INTRAMUSCULAR | Status: DC | PRN

## 2019-01-05 MED ORDER — METFORMIN HCL ER 500 MG PO TB24
500.0000 mg | ORAL_TABLET | Freq: Every day | ORAL | Status: DC
  Administered 2019-01-07 – 2019-01-14 (×6): 500 mg via ORAL
  Filled 2019-01-05 (×10): qty 1

## 2019-01-05 MED ORDER — NALOXONE HCL 0.4 MG/ML IJ SOLN: 0.4000 mg | INTRAMUSCULAR | Status: DC | PRN

## 2019-01-05 MED ORDER — ALBUMIN HUMAN 5 % IV SOLN
INTRAVENOUS | Status: DC | PRN
  Administered 2019-01-05 (×2): via INTRAVENOUS

## 2019-01-05 MED ORDER — ROCURONIUM BROMIDE 10 MG/ML (PF) SYRINGE
PREFILLED_SYRINGE | INTRAVENOUS | Status: AC
  Filled 2019-01-05: qty 20

## 2019-01-05 MED ORDER — VENLAFAXINE HCL ER 150 MG PO CP24
150.0000 mg | ORAL_CAPSULE | Freq: Every day | ORAL | Status: DC
  Administered 2019-01-06 – 2019-01-13 (×7): 150 mg via ORAL
  Filled 2019-01-05 (×9): qty 1

## 2019-01-05 MED ORDER — DEXAMETHASONE SODIUM PHOSPHATE 10 MG/ML IJ SOLN
INTRAMUSCULAR | Status: DC | PRN
  Administered 2019-01-05: 4 mg via INTRAVENOUS

## 2019-01-05 MED ORDER — SODIUM CHLORIDE 0.9% FLUSH: 9.0000 mL | INTRAVENOUS | Status: DC | PRN

## 2019-01-05 MED ORDER — LOSARTAN POTASSIUM 25 MG PO TABS
25.0000 mg | ORAL_TABLET | Freq: Every day | ORAL | Status: DC
  Administered 2019-01-06 – 2019-01-13 (×8): 25 mg via ORAL
  Filled 2019-01-05 (×8): qty 1

## 2019-01-05 MED ORDER — ALBUTEROL SULFATE HFA 108 (90 BASE) MCG/ACT IN AERS: 1.0000 | INHALATION_SPRAY | RESPIRATORY_TRACT | Status: DC | PRN

## 2019-01-05 MED ORDER — SODIUM CHLORIDE 0.9 % IV SOLN
INTRAVENOUS | Status: DC | PRN
  Administered 2019-01-08: 900 mL via INTRA_ARTERIAL

## 2019-01-05 MED ORDER — FENTANYL CITRATE (PF) 250 MCG/5ML IJ SOLN
INTRAMUSCULAR | Status: AC
  Filled 2019-01-05: qty 5

## 2019-01-05 MED ORDER — LIDOCAINE 2% (20 MG/ML) 5 ML SYRINGE
INTRAMUSCULAR | Status: AC
  Filled 2019-01-05: qty 5

## 2019-01-05 MED ORDER — INSULIN ASPART 100 UNIT/ML ~~LOC~~ SOLN
0.0000 [IU] | Freq: Three times a day (TID) | SUBCUTANEOUS | Status: DC
  Administered 2019-01-05 – 2019-01-13 (×15): 2 [IU] via SUBCUTANEOUS

## 2019-01-05 MED ORDER — DIPHENHYDRAMINE HCL 50 MG/ML IJ SOLN: 12.5000 mg | Freq: Four times a day (QID) | INTRAMUSCULAR | Status: DC | PRN

## 2019-01-05 MED ORDER — LEVALBUTEROL HCL 0.63 MG/3ML IN NEBU
0.6300 mg | INHALATION_SOLUTION | Freq: Three times a day (TID) | RESPIRATORY_TRACT | Status: AC
  Administered 2019-01-05 (×2): 0.63 mg via RESPIRATORY_TRACT
  Filled 2019-01-05: qty 3

## 2019-01-05 MED ORDER — ONDANSETRON HCL 4 MG/2ML IJ SOLN: INTRAMUSCULAR | Status: DC | PRN

## 2019-01-05 MED ORDER — ALBUTEROL SULFATE HFA 108 (90 BASE) MCG/ACT IN AERS
INHALATION_SPRAY | RESPIRATORY_TRACT | Status: AC
  Filled 2019-01-05: qty 6.7

## 2019-01-05 MED ORDER — SODIUM CHLORIDE 0.9 % IV SOLN
INTRAVENOUS | Status: AC
  Filled 2019-01-05 (×2): qty 2

## 2019-01-05 MED ORDER — BISACODYL 5 MG PO TBEC
10.0000 mg | DELAYED_RELEASE_TABLET | Freq: Every day | ORAL | Status: DC
  Administered 2019-01-07 – 2019-01-14 (×7): 10 mg via ORAL
  Filled 2019-01-05 (×9): qty 2

## 2019-01-05 MED ORDER — SODIUM CHLORIDE 0.9 % IV SOLN: 80.0000 mg | INTRAVENOUS | Status: DC

## 2019-01-05 MED ORDER — SODIUM CHLORIDE 0.9% IV SOLUTION: Freq: Once | INTRAVENOUS | Status: DC

## 2019-01-05 MED ORDER — FENTANYL CITRATE (PF) 250 MCG/5ML IJ SOLN
INTRAMUSCULAR | Status: DC | PRN
  Administered 2019-01-05 (×2): 50 ug via INTRAVENOUS
  Administered 2019-01-05: 100 ug via INTRAVENOUS
  Administered 2019-01-05: 50 ug via INTRAVENOUS

## 2019-01-05 MED ORDER — LIDOCAINE 2% (20 MG/ML) 5 ML SYRINGE
INTRAMUSCULAR | Status: DC | PRN
  Administered 2019-01-05: 100 mg via INTRAVENOUS

## 2019-01-05 MED ORDER — CEFAZOLIN SODIUM-DEXTROSE 1-4 GM/50ML-% IV SOLN
1.0000 g | Freq: Four times a day (QID) | INTRAVENOUS | Status: AC
  Administered 2019-01-05 – 2019-01-06 (×3): 1 g via INTRAVENOUS
  Filled 2019-01-05 (×3): qty 50

## 2019-01-05 MED ORDER — ACETAMINOPHEN 500 MG PO TABS
1000.0000 mg | ORAL_TABLET | Freq: Once | ORAL | Status: AC
  Administered 2019-01-05: 1000 mg via ORAL
  Filled 2019-01-05: qty 2

## 2019-01-05 MED ORDER — ACETAMINOPHEN 500 MG PO TABS
1000.0000 mg | ORAL_TABLET | Freq: Four times a day (QID) | ORAL | Status: AC
  Administered 2019-01-06 – 2019-01-10 (×15): 1000 mg via ORAL
  Filled 2019-01-05 (×16): qty 2

## 2019-01-05 MED ORDER — MUPIROCIN 2 % EX OINT
TOPICAL_OINTMENT | CUTANEOUS | Status: AC
  Administered 2019-01-05: 1
  Filled 2019-01-05: qty 22

## 2019-01-05 MED ORDER — OXYCODONE HCL 5 MG PO TABS: 5.0000 mg | ORAL_TABLET | ORAL | Status: DC | PRN

## 2019-01-05 MED ORDER — DEXAMETHASONE SODIUM PHOSPHATE 10 MG/ML IJ SOLN
INTRAMUSCULAR | Status: AC
  Filled 2019-01-05: qty 1

## 2019-01-05 MED ORDER — TRAMADOL HCL 50 MG PO TABS
50.0000 mg | ORAL_TABLET | Freq: Four times a day (QID) | ORAL | Status: DC | PRN
  Administered 2019-01-06 – 2019-01-12 (×3): 100 mg via ORAL
  Filled 2019-01-05 (×3): qty 2

## 2019-01-05 MED ORDER — MIDAZOLAM HCL 2 MG/2ML IJ SOLN
INTRAMUSCULAR | Status: AC
  Filled 2019-01-05: qty 2

## 2019-01-05 MED ORDER — LEVALBUTEROL HCL 0.63 MG/3ML IN NEBU
INHALATION_SOLUTION | RESPIRATORY_TRACT | Status: AC
  Filled 2019-01-05: qty 3

## 2019-01-05 MED ORDER — ATORVASTATIN CALCIUM 40 MG PO TABS
40.0000 mg | ORAL_TABLET | Freq: Every day | ORAL | Status: DC
  Administered 2019-01-06 – 2019-01-13 (×8): 40 mg via ORAL
  Filled 2019-01-05 (×8): qty 1

## 2019-01-05 MED ORDER — DIPHENHYDRAMINE HCL 12.5 MG/5ML PO ELIX: 12.5000 mg | ORAL_SOLUTION | Freq: Four times a day (QID) | ORAL | Status: DC | PRN

## 2019-01-05 MED ORDER — CEFAZOLIN SODIUM-DEXTROSE 2-4 GM/100ML-% IV SOLN: 2.0000 g | Freq: Three times a day (TID) | INTRAVENOUS | Status: DC

## 2019-01-05 MED ORDER — 0.9 % SODIUM CHLORIDE (POUR BTL) OPTIME
TOPICAL | Status: DC | PRN
  Administered 2019-01-05: 2000 mL

## 2019-01-05 MED ORDER — NALOXONE HCL 0.4 MG/ML IJ SOLN
INTRAMUSCULAR | Status: DC | PRN
  Administered 2019-01-05 (×2): 40 ug via INTRAVENOUS

## 2019-01-05 MED ORDER — ALBUTEROL SULFATE HFA 108 (90 BASE) MCG/ACT IN AERS
INHALATION_SPRAY | RESPIRATORY_TRACT | Status: DC | PRN
  Administered 2019-01-05: 2 via RESPIRATORY_TRACT

## 2019-01-05 MED ORDER — CELECOXIB 200 MG PO CAPS
400.0000 mg | ORAL_CAPSULE | Freq: Once | ORAL | Status: AC
  Administered 2019-01-05: 400 mg via ORAL
  Filled 2019-01-05: qty 2

## 2019-01-05 MED ORDER — ACETAMINOPHEN 160 MG/5ML PO SOLN: 1000.0000 mg | Freq: Four times a day (QID) | ORAL | Status: AC

## 2019-01-05 MED ORDER — ROCURONIUM BROMIDE 10 MG/ML (PF) SYRINGE
PREFILLED_SYRINGE | INTRAVENOUS | Status: DC | PRN
  Administered 2019-01-05: 30 mg via INTRAVENOUS
  Administered 2019-01-05: 50 mg via INTRAVENOUS
  Administered 2019-01-05: 20 mg via INTRAVENOUS
  Administered 2019-01-05: 50 mg via INTRAVENOUS
  Administered 2019-01-05: 20 mg via INTRAVENOUS

## 2019-01-05 MED ORDER — SUGAMMADEX SODIUM 200 MG/2ML IV SOLN
INTRAVENOUS | Status: DC | PRN
  Administered 2019-01-05: 200 mg via INTRAVENOUS

## 2019-01-05 MED ORDER — NALOXONE HCL 0.4 MG/ML IJ SOLN
INTRAMUSCULAR | Status: AC
  Filled 2019-01-05: qty 1

## 2019-01-05 MED ORDER — AMIODARONE HCL 200 MG PO TABS
200.0000 mg | ORAL_TABLET | Freq: Every day | ORAL | Status: DC
  Administered 2019-01-06: 200 mg via ORAL
  Filled 2019-01-05: qty 1

## 2019-01-05 MED ORDER — MIDAZOLAM HCL 5 MG/5ML IJ SOLN
INTRAMUSCULAR | Status: DC | PRN
  Administered 2019-01-05 (×2): 2 mg via INTRAVENOUS

## 2019-01-05 MED ORDER — MOMETASONE FURO-FORMOTEROL FUM 200-5 MCG/ACT IN AERO
2.0000 | INHALATION_SPRAY | Freq: Two times a day (BID) | RESPIRATORY_TRACT | Status: DC
  Administered 2019-01-06 – 2019-01-14 (×16): 2 via RESPIRATORY_TRACT
  Filled 2019-01-05: qty 8.8

## 2019-01-05 MED ORDER — HEPARIN (PORCINE) IN NACL 2-0.9 UNITS/ML
INTRAMUSCULAR | Status: DC | PRN
  Administered 2019-01-05: 500 mL

## 2019-01-05 MED ORDER — CEFAZOLIN SODIUM-DEXTROSE 2-4 GM/100ML-% IV SOLN
2.0000 g | INTRAVENOUS | Status: AC
  Administered 2019-01-05: 2 g via INTRAVENOUS
  Filled 2019-01-05: qty 100

## 2019-01-05 SURGICAL SUPPLY — 72 items
BLADE STERNUM SYSTEM 6 (BLADE) ×2 IMPLANT
BLADE SURG 15 STRL LF DISP TIS (BLADE) IMPLANT
BLADE SURG 15 STRL SS (BLADE) ×2
CABLE PACING FASLOC BIEGE (MISCELLANEOUS) ×2 IMPLANT
CABLE PACING FASLOC BLUE (MISCELLANEOUS) ×2 IMPLANT
CANISTER SUCT 3000ML PPV (MISCELLANEOUS) ×11 IMPLANT
CATH S G BIP PACING (CATHETERS) ×2 IMPLANT
CATH THORACIC 36FR (CATHETERS) IMPLANT
COIL ONE TIE COMPRESSION (VASCULAR PRODUCTS) ×2 IMPLANT
CONN ST 1/4X3/8  BEN (MISCELLANEOUS) ×2
CONN ST 1/4X3/8 BEN (MISCELLANEOUS) IMPLANT
COVER PROBE W GEL 5X96 (DRAPES) ×2 IMPLANT
DERMABOND ADVANCED (GAUZE/BANDAGES/DRESSINGS) ×2
DERMABOND ADVANCED .7 DNX12 (GAUZE/BANDAGES/DRESSINGS) IMPLANT
DRAIN CHANNEL 28F RND 3/8 FF (WOUND CARE) ×2 IMPLANT
DRAPE C-ARM 42X72 X-RAY (DRAPES) ×2 IMPLANT
DRAPE INCISE IOBAN 66X45 STRL (DRAPES) ×2 IMPLANT
DRAPE SLUSH/WARMER DISC (DRAPES) ×5 IMPLANT
DRSG TEGADERM 4X4.75 (GAUZE/BANDAGES/DRESSINGS) ×2 IMPLANT
ELECT BLADE 4.0 EZ CLEAN MEGAD (MISCELLANEOUS) ×5
ELECT BLADE 6.5 EXT (BLADE) ×2 IMPLANT
ELECT REM PT RETURN 9FT ADLT (ELECTROSURGICAL) ×10
ELECTRODE BLDE 4.0 EZ CLN MEGD (MISCELLANEOUS) ×3 IMPLANT
ELECTRODE REM PT RTRN 9FT ADLT (ELECTROSURGICAL) ×9 IMPLANT
FELT TEFLON 1X6 (MISCELLANEOUS) ×2 IMPLANT
GAUZE 4X4 16PLY RFD (DISPOSABLE) ×8 IMPLANT
GAUZE SPONGE 4X4 12PLY STRL (GAUZE/BANDAGES/DRESSINGS) ×8 IMPLANT
GAUZE SPONGE 4X4 12PLY STRL LF (GAUZE/BANDAGES/DRESSINGS) ×4 IMPLANT
GLOVE BIO SURGEON STRL SZ 6.5 (GLOVE) ×8 IMPLANT
GLOVE BIO SURGEONS STRL SZ 6.5 (GLOVE) ×2
GLOVE BIOGEL PI IND STRL 6.5 (GLOVE) IMPLANT
GLOVE BIOGEL PI IND STRL 7.0 (GLOVE) IMPLANT
GLOVE BIOGEL PI IND STRL 7.5 (GLOVE) ×3 IMPLANT
GLOVE BIOGEL PI INDICATOR 6.5 (GLOVE) ×2
GLOVE BIOGEL PI INDICATOR 7.0 (GLOVE) ×2
GLOVE BIOGEL PI INDICATOR 7.5 (GLOVE) ×4
GLOVE ECLIPSE 6.5 STRL STRAW (GLOVE) ×4 IMPLANT
GLOVE ECLIPSE 8.0 STRL XLNG CF (GLOVE) ×7 IMPLANT
GOWN STRL REUS W/ TWL LRG LVL3 (GOWN DISPOSABLE) ×15 IMPLANT
GOWN STRL REUS W/ TWL XL LVL3 (GOWN DISPOSABLE) ×3 IMPLANT
GOWN STRL REUS W/TWL LRG LVL3 (GOWN DISPOSABLE) ×16
GOWN STRL REUS W/TWL XL LVL3 (GOWN DISPOSABLE) ×6
KIT BASIN OR (CUSTOM PROCEDURE TRAY) ×5 IMPLANT
KIT SUCTION CATH 14FR (SUCTIONS) ×2 IMPLANT
KIT TURNOVER KIT B (KITS) ×8 IMPLANT
LEAD CAPSURE NOVUS 5076-58CM (Lead) ×2 IMPLANT
LEAD PACING MYOCARDI (MISCELLANEOUS) ×2 IMPLANT
NDL PERC 18GX7CM (NEEDLE) IMPLANT
NEEDLE PERC 18GX7CM (NEEDLE) ×5 IMPLANT
NS IRRIG 1000ML POUR BTL (IV SOLUTION) ×16 IMPLANT
PACK CHEST (CUSTOM PROCEDURE TRAY) ×5 IMPLANT
PAD ARMBOARD 7.5X6 YLW CONV (MISCELLANEOUS) ×16 IMPLANT
SLEEVE REPOSITIONING LENGTH 30 (MISCELLANEOUS) ×2 IMPLANT
SUT PROLENE 2 0 CT2 30 (SUTURE) ×4 IMPLANT
SUT PROLENE 4 0 RB 1 (SUTURE) ×2
SUT PROLENE 4-0 RB1 .5 CRCL 36 (SUTURE) IMPLANT
SUT PROLENE 5 0 C 1 36 (SUTURE) ×4 IMPLANT
SUT SILK  1 MH (SUTURE) ×2
SUT SILK 1 MH (SUTURE) ×12 IMPLANT
SUT SILK 2 0 SH CR/8 (SUTURE) ×2 IMPLANT
SUT TEM PAC WIRE 2 0 SH (SUTURE) ×4 IMPLANT
SUT VIC AB 1 CTX 18 (SUTURE) ×2 IMPLANT
SUT VIC AB 2-0 CTX 36 (SUTURE) ×2 IMPLANT
SUT VIC AB 3-0 X1 27 (SUTURE) ×2 IMPLANT
SYR 20ML LL LF (SYRINGE) ×4 IMPLANT
SYSTEM SAHARA CHEST DRAIN ATS (WOUND CARE) ×5 IMPLANT
TAPE CLOTH SURG 4X10 WHT LF (GAUZE/BANDAGES/DRESSINGS) ×4 IMPLANT
TOWEL GREEN STERILE (TOWEL DISPOSABLE) ×11 IMPLANT
TOWEL GREEN STERILE FF (TOWEL DISPOSABLE) ×11 IMPLANT
TRAY FOLEY MTR SLVR 16FR STAT (SET/KITS/TRAYS/PACK) ×8 IMPLANT
TUBING EXTENTION W/L.L. (IV SETS) ×2 IMPLANT
WATER STERILE IRR 1000ML POUR (IV SOLUTION) ×10 IMPLANT

## 2019-01-05 NOTE — Anesthesia Procedure Notes (Signed)
Central Venous Catheter Insertion Performed by: Duane Boston, MD, anesthesiologist Start/End12/03/2018 7:43 AM, 01/05/2019 7:53 AM Patient location: Pre-op. Preanesthetic checklist: patient identified, IV checked, site marked, risks and benefits discussed, surgical consent, monitors and equipment checked, pre-op evaluation, timeout performed and anesthesia consent Position: Trendelenburg Lidocaine 1% used for infiltration and patient sedated Hand hygiene performed , maximum sterile barriers used  and Seldinger technique used Catheter size: 8 Fr Total catheter length 16. Central line was placed.Double lumen Procedure performed using ultrasound guided technique. Ultrasound Notes:image(s) printed for medical record Attempts: 1 Following insertion, dressing applied, line sutured and Biopatch. Post procedure assessment: blood return through all ports, free fluid flow and no air  Patient tolerated the procedure well with no immediate complications.

## 2019-01-05 NOTE — Anesthesia Postprocedure Evaluation (Signed)
Anesthesia Post Note  Patient: Ana Martin  Procedure(s) Performed: - MEDTRONIC BIV  ICD SYSTEM REMOVAL AND TEMPORARY PACEMAKER PLACEMENT (N/A Chest) REMOVAL OF EPICARDIAL PACING LEAD PLEURALS X2 WITH PLACEMENT OF TEMPORARY EPICARDIAL PACING LEADS (N/A ) LEFT MINI THORACOTOMY (Left Chest)     Patient location during evaluation: SICU Anesthesia Type: General Level of consciousness: sedated Pain management: pain level controlled Vital Signs Assessment: post-procedure vital signs reviewed and stable Respiratory status: patient remains intubated per anesthesia plan Cardiovascular status: stable Postop Assessment: no apparent nausea or vomiting Anesthetic complications: no    Last Vitals:  Vitals:   01/05/19 0820 01/05/19 1327  BP:    Pulse: 70   Resp: 10   Temp:    SpO2: 96% 99%    Last Pain:  Vitals:   01/05/19 0712  PainSc: 0-No pain                 Drayk Humbarger DANIEL

## 2019-01-05 NOTE — Progress Notes (Signed)
CT Surgery PM rounds  Awake on vent PS wean  ABG with poor spontaneous tidal volume and pCO2 57- not ready to extubate Paced at 80 with stable BP

## 2019-01-05 NOTE — Progress Notes (Signed)
Orthopedic Tech Progress Note Patient Details:  Ana Martin August 07, 1955 [id number]  Ortho Devices Type of Ortho Device: Knee Immobilizer Ortho Device/Splint Location: RLE Ortho Device/Splint Interventions: Ordered, Application   Post Interventions Patient Tolerated: Well Instructions Provided: Care of device   Braulio Bosch 01/05/2019, 2:41 PM

## 2019-01-05 NOTE — Progress Notes (Signed)
  Echocardiogram Echocardiogram Transesophageal has been performed.  Ana Martin 01/05/2019, 9:16 AM

## 2019-01-05 NOTE — CV Procedure (Signed)
EP procedure note  Procedure performed: Extraction of a dual-chamber ICD and insertion of a temporary pacemaker  Preoperative diagnosis: Infected ICD pacing/defibrillator system in a patient with complete heart block  Postoperative diagnosis: Same as preoperative diagnosis  Description of the procedure: After informed consent was obtained the patient was taken to the operating room in the fasting state.  The anesthesia service was utilized to provide general endotracheal anesthesia and invasive hemodynamic arterial monitoring.  After the appropriate timeouts and the usual preparation and draping, attention was first turned to the epicardial left ventricular pacing leads.  Dr. Servando Snare will dictate a separate note regarding removal of these leads which was carried out without complication.  The leads were cut.    At this point attention was turned to the infected portion of the defibrillator system.  First a 6French sheath was placed in the right femoral vein.  The patient had a temporary epicardial lead placed which was working satisfactorily but had a high threshold.  It was for this reason that we decided to place a temporary transvenous pacing lead which was carried out without difficulty with satisfactory pacing and sensing thresholds.  At this point attention was turned to removal of the patient's ICD system.  A 7 cm ellipse incision was carried out over the old ICD site which was visible and coming through the skin.  The device was removed and electrocautery utilized to dissect through the old necrotic skin and to good viable tissue.  Attention was then turned to removal of the pacing leads.  The epicardial remnants were removed with gentle traction with no difficulty.  Electrocautery was utilized to free up the atrial and defibrillation lead.  Attention was first turned to the atrial lead.  A stylette was inserted into the atrial lead and the helix retracted.  The sewing sleeve was removed.  The  lead was cut.  A Cook liberator locking stylette was advanced into the body of the lead.  It was locked in place.  A Cook 1 tie suture was placed on the proximal portion of the lead.  The [facility] [place] short RL extraction sheath was then advanced onto the atrial lead and gentle traction placed and the lead was removed with out difficulty.  Attention was then turned to the defibrillator lead.  The stylette was inserted into the defibrillator lead and the helix retracted.  The lead was cut.  The sewing sleeves were freed up.  The Cook liberator locking stylette was then advanced into the defibrillator lead and locked in place.  The Geraldine 1 tie proximal suture was placed over the back of the lead.  The [facility] [place] RL short extraction sheath was then advanced over the defibrillator lead.  A combination of traction and countertraction, pressure and counterpressure was utilized and the dual coil defibrillator lead was removed.  Pressure was held.  Hemostasis was obtained.  Electrocautery was utilized to remove the scar tissue.  Electrocautery was utilized to assure hemostasis.  The pocket was irrigated.  The incision was then closed with multiple Prolene mattress suture.  Pressure was held.  At this point, we attempted to place a temporary permanent pacing lead into the left internal jugular vein but multiple attempts to place the lead were unsuccessful.  We cannulated the jugular vein but a guidewire could not be advanced.  With backup pacing epicardially as well as through the right femoral venous system the patient was recovered.  A pressure dressing was placed over the left infraclavicular  incision.  The patient was returned to the recovery area in stable condition.  Complications: There were no immediate procedure complications  Conclusion: Successful extraction of a Medtronic BiV ICD, Biotronik atrial and defibrillator leads, and epicardial leads which will be dictated under the direction of Dr.  Servando Snare.  Ana Peru, MD

## 2019-01-05 NOTE — Anesthesia Procedure Notes (Addendum)
Procedure Name: Intubation Date/Time: 01/05/2019 8:45 AM Performed by: Renato Shin, CRNA Pre-anesthesia Checklist: Patient identified, Emergency Drugs available, Suction available and Patient being monitored Patient Re-evaluated:Patient Re-evaluated prior to induction Oxygen Delivery Method: Circle system utilized Preoxygenation: Pre-oxygenation with 100% oxygen Induction Type: IV induction, Rapid sequence and Cricoid Pressure applied Laryngoscope Size: Mac and 3 Grade View: Grade I Endobronchial tube: Left, Double lumen EBT, EBT position confirmed by auscultation and EBT position confirmed by fiberoptic bronchoscope and 37 Fr Number of attempts: 1 Airway Equipment and Method: Oral airway and Rigid stylet (VIVA Sight) Placement Confirmation: ETT inserted through vocal cords under direct vision,  positive ETCO2 and breath sounds checked- equal and bilateral Tube secured with: Tape Dental Injury: Teeth and Oropharynx as per pre-operative assessment  Comments: Intubated by Orma Flaming, SRNA

## 2019-01-05 NOTE — Transfer of Care (Signed)
Immediate Anesthesia Transfer of Care Note  Patient: Ana Martin  Procedure(s) Performed: - MEDTRONIC BIV  ICD SYSTEM REMOVAL AND TEMPORARY PACEMAKER PLACEMENT (N/A Chest) REMOVAL OF EPICARDIAL PACING LEAD PLEURALS X2 WITH PLACEMENT OF TEMPORARY EPICARDIAL PACING LEADS (N/A ) LEFT MINI THORACOTOMY (Left Chest)  Patient Location: ICU  Anesthesia Type:General  Level of Consciousness: sedated and Patient remains intubated per anesthesia plan  Airway & Oxygen Therapy: Patient remains intubated per anesthesia plan and Patient placed on Ventilator (see vital sign flow sheet for setting)  Post-op Assessment: Report given to RN and Post -op Vital signs reviewed and stable  Post vital signs: Reviewed and stable  Last Vitals:  Vitals Value Taken Time  BP    Temp    Pulse 81 01/05/19 1355  Resp 21 01/05/19 1355  SpO2 100 % 01/05/19 1355  Vitals shown include unvalidated device data.  Last Pain:  Vitals:   01/05/19 0712  PainSc: 0-No pain      Patients Stated Pain Goal: 3 ([id number])  Complications: No apparent anesthesia complications

## 2019-01-05 NOTE — Anesthesia Procedure Notes (Signed)
Procedure Name: Intubation Date/Time: 01/05/2019 1:00 PM Performed by: Renato Shin, CRNA Pre-anesthesia Checklist: Patient identified, Emergency Drugs available, Suction available and Patient being monitored Patient Re-evaluated:Patient Re-evaluated prior to induction Oxygen Delivery Method: Circle system utilized Preoxygenation: Pre-oxygenation with 100% oxygen Induction Type: IV induction and Inhalational induction Laryngoscope Size: Glidescope and 3 Grade View: Grade I Tube type: Oral Tube size: 8.0 mm Number of attempts: 1 Airway Equipment and Method: Bougie stylet Marsa Aris catheter) Placement Confirmation: ETT inserted through vocal cords under direct vision,  positive ETCO2 and breath sounds checked- equal and bilateral Secured at: 21 cm Tube secured with: Tape Dental Injury: Teeth and Oropharynx as per pre-operative assessment  Comments: Glidescope inserted, grade 1 view obtained, Cooke Catheter inserted into bronchial lumen of double lumen ETT, DLT pulled out while keeping Cooke Catheter in to exchange to a 8.0 ETT. Breath sounds equal and bilateral, ETT taped to 21 at the lips.

## 2019-01-05 NOTE — H&P (Addendum)
[address]       [place] [address]             [phone]   Telehealth Visit     [name] [place] Medical Record [id number] Date of Birth: Dec 18, 1955  Referring: No ref. provider found Primary Care: Celene Squibb, MD Primary Cardiologist: Carlyle Dolly, MD  Chief Complaint: Infected pacemaker generator system, requested by Dr. Lovena Le to assist in removal,   History of Present Illness:    Ana Martin 63 y.o. 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  History has been reviewed with Dr. Lovena Le and x-ray findings reviewed.  While being cared for in [facility] the patient had a biventricular pacer , AICD placed in [facility] with a left thoracotomy.  Coronary sinus catheter could not be placed at the time. 04/2011  The patient then had a generator change done June  25th 2020 by Dr Rayann Heman.  Subsequently the pocket has become infected, patient was seen by  Dr. Lovena Le for  removal.  On review of her CT scans done in the past she is also incidentally noted to have multiple small pulmonary nodules, in July 2019.  A follow-up CT scan was recommended but never done  Previously the patient had a mitral valve replacement by Dr. Ernesto Rutherford with a mechanical mitral valve valve,   Current Activity/ Functional Status:  Patient is independent with mobility/ambulation, transfers, ADL's, IADL's.   Zubrod Score: At the time of surgery this patients most appropriate activity status/level should be described as: []     0    Normal activity, no symptoms [x]     1    Restricted in physical strenuous activity but ambulatory, able to do out light work []     2    Ambulatory and capable of self care, unable to do work activities, up and about               >50 % of waking hours                              []     3    Only limited self care, in bed greater than 50% of waking hours []     4    Completely disabled, no self care,  confined to bed or chair []     5    Moribund   Past Medical History:  Diagnosis Date   AICD (automatic cardioverter/defibrillator) present    Anxiety    Asthma    Atrial fibrillation (HCC)    Automatic implantable cardiac defibrillator in situ    Lumax DR-T 09-20-2007 Dr.Akbary   COPD (chronic obstructive pulmonary disease) ([place])    Depression    Diabetes mellitus without complication ([place])    Type II   Hyperlipidemia    Hypersomnia, unspecified    related to known Axis III factor   Hypertension    Lump or mass in breast    at the 7 o'clock position of right breast(non-tender)   Other abnormal glucose    Other left bundle branch block    Other primary cardiomyopathies    PONV (postoperative nausea and vomiting)    Presence of permanent cardiac pacemaker    Seizure ([place])    Thyroid cancer ([place])    Thyroid disease    Unspecified vitamin D deficiency    Wolff-Parkinson-White syndrome  Past Surgical History:  Procedure Laterality Date   ABDOMINAL HYSTERECTOMY     BIV ICD GENERATOR CHANGEOUT N/A 07/29/2018   Procedure: BIV ICD GENERATOR CHANGEOUT;  Surgeon: Thompson Grayer, MD;  Location: [facility];  Service: Cardiovascular;  Laterality: N/A;   CARDIAC VALVE REPLACEMENT     st jude silzone mitray mechanical 50ms-601 ss# [id number]   CHOLECYSTECTOMY     COLONOSCOPY     April 12, 2008   JOINT REPLACEMENT     PACEMAKER PLACEMENT     SHOULDER SURGERY     Left    Family History  Problem Relation Age of Onset   Lung cancer Father        expired 58   Sarcoidosis Mother        espired 2013   COPD Mother      Social History   Tobacco Use  Smoking Status Former Smoker   Packs/day: 1.00   Years: 25.00   Pack years: 25.00   Types: Cigarettes   Quit date: 10/17/2009   Years since quitting: 9.2  Smokeless Tobacco Never Used    Social History   Substance and Sexual Activity  Alcohol Use No     Allergies  Allergen  Reactions   Codeine Nausea And Vomiting    Stomach cramp    Current Facility-Administered Medications  Medication Dose Route Frequency Provider Last Rate Last Dose   0.9 %  sodium chloride infusion (Manually program via Guardrails IV Fluids)   Intravenous Once Evans Lance, MD       0.9 %  sodium chloride infusion   Intravenous Continuous Evans Lance, MD       0.9 %  sodium chloride infusion   Intravenous Continuous Evans Lance, MD       acetaminophen (TYLENOL) tablet 1,000 mg  1,000 mg Oral Once Duane Boston, MD       ceFAZolin (ANCEF) IVPB 2g/100 mL premix  2 g Intravenous 30 min Pre-Op Grace Isaac, MD       celecoxib (CELEBREX) capsule 400 mg  400 mg Oral Once Duane Boston, MD       gentamicin (GARAMYCIN) 80 mg in sodium chloride 0.9 % 500 mL irrigation  80 mg Irrigation On Call Evans Lance, MD        Pertinent items are noted in HPI.   Review of Systems:     Cardiac Review of Systems: [Y] = yes  or   [ N ] = no   Chest Pain Florencio.Farrier    ]  Resting SOB [  y ] Exertional SOB  Blue.Reese  ]  Orthopnea Florencio.Farrier  ]   Pedal Edema [ y  ]    Palpitations [ y ] Syncope  [ n ]   Presyncope [n   ]   General Review of Systems: [Y] = yes [  ]=no Constitional: recent weight change [  ];  Wt loss over the last 3 months [   ] anorexia [  ]; fatigue [  ]; nausea [  ]; night sweats [  ]; fever [  ]; or chills [  ];           Eye : blurred vision [  ]; diplopia [   ]; vision changes [  ];  Amaurosis fugax[  ]; Resp: cough [  ];  wheezing[  ];  hemoptysis[  ]; shortness of breath[  ]; paroxysmal nocturnal dyspnea[  ]; dyspnea on exertion[  ];  or orthopnea[  ];  GI:  gallstones[  ], vomiting[  ];  dysphagia[  ]; melena[  ];  hematochezia [  ]; heartburn[  ];   Hx of  Colonoscopy[  ]; GU: kidney stones [  ]; hematuria[  ];   dysuria [  ];  nocturia[  ];  history of     obstruction [  ]; urinary frequency [  ]             Skin: rash, swelling[  ];, hair loss[  ];  peripheral edema[  ];  or  itching[  ]; Musculosketetal: myalgias[  ];  joint swelling[  ];  joint erythema[  ];  joint pain[  ];  back pain[  ];  Heme/Lymph: bruising[  ];  bleeding[  ];  anemia[  ];  Neuro: TIA[  ];  headaches[  ];  stroke[  ];  vertigo[  ];  seizures[  ];   paresthesias[  ];  difficulty walking[  ];  Psych:depression[  ]; anxiety[  ];  Endocrine: diabetes[  ];  thyroid dysfunction[  ];  Immunizations: Flu up to date [ y ]; Pneumococcal up to date [ y ];  Other:     PHYSICAL EXAMINATION: BP (!) 142/71    Pulse 85    Temp 97.7 F (36.5 C)    Resp 20    Ht 5\' 8"  (1.727 m)    Wt 88.9 kg    SpO2 92%    BMI 29.80 kg/m  General appearance: alert, cooperative and no distress Head: Normocephalic, without obvious abnormality, atraumatic Neck: no adenopathy, no carotid bruit, no JVD, supple, symmetrical, trachea midline and thyroid not enlarged, symmetric, no tenderness/mass/nodules Lymph nodes: Cervical, supraclavicular, and axillary nodes normal. Resp: diminished breath sounds bibasilar Cardio: click of mitral valve, no mr mumur  GI: soft, non-tender; bowel sounds normal; no masses,  no organomegaly Extremities: senile purpura both arms Neurologic: Grossly normal Left upper chest obvious drainage from  pacer pocket    Diagnostic Studies & Laboratory data:     Recent Radiology Findings:    Dg Chest 2 View  Result Date: 01/05/2019 CLINICAL DATA:  ICD system removal. Temporary pacemaker placement. Infection involving implantable cardioverter-defibrillator. EXAM: CHEST - 2 VIEW COMPARISON:  CT chest without contrast 01/03/2019 FINDINGS: Heart is enlarged. Pacing and defibrillator wires are stable. External leads are stable. Mitral annulus repair again noted. Aeration is improved in both lungs. Minimal residual interstitial changes are evident. No significant effusions are present. Left shoulder ORIF is again noted. IMPRESSION: 1. Improved aeration without significant residual edema. 2. Stable  cardiomegaly. 3. Stable pacing and defibrillator leads. Electronically Signed   By: San Morelle M.D.   On: 01/05/2019 06:54   Ct Chest Wo Contrast  Result Date: 01/03/2019 CLINICAL DATA:  Follow-up pulmonary nodules. Former smoker. History of thyroidectomy for thyroid cancer. EXAM: CT CHEST WITHOUT CONTRAST TECHNIQUE: Multidetector CT imaging of the chest was performed following the standard protocol without IV contrast. COMPARISON:  08/31/2017 CT chest. FINDINGS: Cardiovascular: Mild cardiomegaly. No significant pericardial effusion/thickening. Marked wall valve prosthesis in place. 2 lead left subclavian ICD is noted with lead tips in the right atrium and right ventricular apex, with 2 separate left chest wall leads terminating in the left epicardial space. Three-vessel coronary atherosclerosis. Atherosclerotic nonaneurysmal thoracic aorta. Stable dilated main pulmonary artery (4.0 cm diameter). Mediastinum/Nodes: Total thyroidectomy. Unremarkable esophagus. No axillary adenopathy. Chronic mild right paratracheal adenopathy up to 1.3 cm (series 2/image 54), stable. No new pathologically  enlarged mediastinal nodes. No discrete hilar adenopathy on this noncontrast scan. Lungs/Pleura: No pneumothorax. No pleural effusion. Mild centrilobular and paraseptal emphysema with diffuse bronchial wall thickening. No acute consolidative airspace disease or lung masses. Solid 7 mm medial apical left upper lobe pulmonary nodule (series 8/image 31), stable since 08/22/2017 chest CT. No additional significant pulmonary nodules. Innumerable coarsely calcified subcentimeter granulomas scattered in both lungs are unchanged. Patchy ground-glass centrilobular micronodularity throughout both lungs. Upper abdomen: Cholecystectomy. Stable 1.7 cm left adrenal nodule with density -29 HU, stable 1.6 cm left adrenal nodule with density -72 HU and separate stable 2.0 cm left adrenal nodule with density -100 HU, compatible with  benign myelolipomas. Musculoskeletal: No aggressive appearing focal osseous lesions. Mild lower thoracic spondylosis. Chronic moderate L1 vertebral compression fracture. Intact sternotomy wires. IMPRESSION: 1. Solid 7 mm medial apical left upper lobe pulmonary nodule is stable since 08/30/2017 chest CT, considered benign. No additional significant pulmonary nodules. Innumerable subcentimeter calcified granulomas in both lungs are unchanged. 2. Chronic mild right paratracheal adenopathy, most compatible with benign reactive etiology. 3. Cardiomegaly.  Three-vessel coronary atherosclerosis. 4. Stable prominently dilated main pulmonary artery, compatible with chronic pulmonary arterial hypertension. 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 5. Stable left adrenal myelolipomas. Aortic Atherosclerosis (ICD10-I70.0) and Emphysema (ICD10-J43.9). Electronically Signed   By: Ilona Sorrel M.D.   On: 01/03/2019 16:33    I have independently reviewed the above radiology studies  and reviewed the findings with the patient.   CLINICAL DATA:  Found down, bruising to chest  EXAM: CT CHEST, ABDOMEN AND PELVIS WITHOUT CONTRAST  TECHNIQUE: Multidetector CT imaging of the chest, abdomen and pelvis was performed following the standard protocol without IV contrast.  COMPARISON:  None.  FINDINGS: CT CHEST FINDINGS  Cardiovascular: Cardiomegaly.  No pericardial effusion.  Prosthetic mitral valve.  Left chest ICD.  No evidence of thoracic aortic aneurysm. Atherosclerotic calcifications of the aortic arch.  Three vessel coronary atherosclerosis.  Mediastinum/Nodes: No suspicious mediastinal or axillary lymphadenopathy.  Thyroid is absent.  Lungs/Pleura: Evaluation of the lung parenchyma is constrained by respiratory motion.  Numerous pulmonary nodules bilaterally,  most of which reflect calcified granulomata, benign.  However, a dominant 7 mm noncalcified nodule is present in the medial left upper lobe (series 4/image 40). Additional 4 mm noncalcified nodule in the posterior left upper lobe (series 4/image 42). 8 x 5 mm nodule in the superior segment left lower lobe (series 4/image 61).  Faint ground-glass centrilobular nodularity in the bilateral upper lobes.  No focal consolidation. Trace right pleural effusion. No pneumothorax.  Musculoskeletal: No fracture is seen.  Median sternotomy.  CT ABDOMEN PELVIS FINDINGS  Motion degraded images.  Hepatobiliary: Unenhanced liver is grossly unremarkable.  Status post cholecystectomy. No intrahepatic or extrahepatic ductal dilatation.  Pancreas: Within normal limits.  Spleen: Within normal limits.  Adrenals/Urinary Tract: Adrenal glands are within normal limits.  Kidneys are within normal limits. No renal, ureteral, or bladder calculi. No hydronephrosis.  Bladder is within normal limits.  Stomach/Bowel: Stomach is within normal limits.  No evidence of bowel obstruction.  Normal appendix (series 3/image 102).  Mild sigmoid diverticulosis, without evidence of diverticulitis.  Vascular/Lymphatic: No evidence of abdominal aortic aneurysm.  Atherosclerotic calcifications of the abdominal aorta and branch vessels.  No suspicious abdominopelvic lymphadenopathy.  Reproductive: Status post hysterectomy.  No adnexal masses.  Other: No abdominopelvic ascites.  No hemoperitoneum or free air.  Musculoskeletal: Mild superior endplate  changes at L1.  No fracture is seen.  IMPRESSION: Motion degraded images.  No evidence of traumatic injury to the abdomen/pelvis.  Numerous bilateral pulmonary nodules, some of which reflect benign calcified granulomata, although dominant noncalcified nodules in the left lung measure up to 7 mm, poorly evaluated on the  current motion-degraded study. Non-contrast chest CT at 3-6 months is recommended. If the nodules are stable at time of repeat CT, then future CT at 18-24 months (from today's scan) is considered optional for low-risk patients, but is recommended for high-risk patients. This recommendation follows the consensus statement: Guidelines for Management of Incidental Pulmonary Nodules Detected on CT Images: From the Fleischner Society 2017; Radiology 2017; 284:228-243.   Electronically Signed   By: Julian Hy M.D.   On: 08/31/2017 00:23  I have independently reviewed the above radiology studies  and reviewed the findings with the patient.   Recent Lab Findings: Lab Results  Component Value Date   WBC 11.2 (H) 12/28/2018   HGB 14.7 12/28/2018   HCT 46.5 12/28/2018   PLT 210 12/28/2018   GLUCOSE 89 12/28/2018   ALT 17 11/23/2017   AST 26 11/23/2017   NA 141 12/28/2018   K 4.5 12/28/2018   CL 98 12/28/2018   CREATININE 0.84 12/28/2018   BUN 13 12/28/2018   CO2 26 12/28/2018   TSH 4.139 09/05/2017   INR 1.8 (A) 01/03/2019      Assessment / Plan:    I have reviewed with the patient the planned left minithoracotomy for removal of epicardial pacing wires, because of the underlying infection of the pacemaker device.  Risks and options of the procedure including bleeding have been discussed with her.  Coumadin  managed through the cardiology office she started lovenox shots Monday   Follow-up CT scan of the chest which was recommended in 2019 was also done today - stable lung nodules   The goals risks and alternatives of the planned surgical procedure Procedure(s): - MEDTRONIC BIV  ICD SYSTEM REMOVAL AND TEMPORARY PACEMAKER PLACEMENT (N/A) EPICARDIAL PACING LEAD REMOVAL (N/A) MINI/LIMITED THORACOTOMY (Left)  have been discussed with the patient in detail. The risks of the procedure including death, infection, stroke, myocardial infarction, bleeding, blood transfusion have all  been discussed specifically.  I have quoted Orlene Erm a 3% of perioperative mortality and a complication rate as high as 50%. The patient's questions have been answered.JULIANYS PASLEY is willing  to proceed with the planned procedure. Grace Isaac MD      [address] [place] [address] Office [phone]     01/05/2019 7:07 AM

## 2019-01-05 NOTE — Brief Op Note (Addendum)
      [address]       [place] [address]             [phone]      01/05/2019  10:58 AM  PATIENT:  Orlene Erm  63 y.o. female  PRE-OPERATIVE DIAGNOSIS:  ICD SYSTEM INFECTION  POST-OPERATIVE DIAGNOSIS:  ICD SYSTEM INFECTION  PROCEDURE:  Procedure(s): EPICARDIAL PACING LEAD REMOVAL (N/A) MINI/LIMITED THORACOTOMY (Left)  SURGEON:  Surgeon(s) and Role: Grace Isaac, MD - Primary  PHYSICIAN ASSISTANT: WAYNE GOLD PA-C  ANESTHESIA:   general  EBL:  50 mL   BLOOD ADMINISTERED:none  DRAINS: (1) Blake drain(s) in the LEFT PLEURAL SPACE   LOCAL MEDICATIONS USED:  NONE  SPECIMEN:  No Specimen  DISPOSITION OF SPECIMEN:  N/A  COUNTS:  YES   DICTATION: .Other Dictation: Dictation Number PENDING  PLAN OF CARE: Admit to inpatient   PATIENT DISPOSITION:  ICU - extubated and stable.   Delay start of Pharmacological VTE agent (>24hrs) due to surgical blood loss or risk of bleeding: yes  COMPLICATIONS: NO KNOWN

## 2019-01-05 NOTE — Anesthesia Procedure Notes (Signed)
Arterial Line Insertion Start/End12/03/2018 7:35 AM, 01/05/2019 7:49 AM Performed by: Duane Boston, MD, Renato Shin, CRNA, CRNA  Patient location: Pre-op. Preanesthetic checklist: patient identified, IV checked, site marked, risks and benefits discussed, surgical consent, monitors and equipment checked, pre-op evaluation, timeout performed and anesthesia consent Lidocaine 1% used for infiltration Right, radial was placed Catheter size: 20 Fr Hand hygiene performed  and maximum sterile barriers used   Attempts: 1 Procedure performed without using ultrasound guided technique. Following insertion, dressing applied and Biopatch. Post procedure assessment: normal and unchanged  Patient tolerated the procedure well with no immediate complications.

## 2019-01-06 ENCOUNTER — Inpatient Hospital Stay (HOSPITAL_COMMUNITY): Payer: PPO

## 2019-01-06 DIAGNOSIS — T827XXA Infection and inflammatory reaction due to other cardiac and vascular devices, implants and grafts, initial encounter: Principal | ICD-10-CM

## 2019-01-06 LAB — POCT I-STAT 7, (LYTES, BLD GAS, ICA,H+H)
Acid-base deficit: 2 mmol/L (ref 0.0–2.0)
Bicarbonate: 26.5 mmol/L (ref 20.0–28.0)
Bicarbonate: 24 mmol/L (ref 20.0–28.0)
Calcium, Ion: 1.14 mmol/L — ABNORMAL LOW (ref 1.15–1.40)
Calcium, Ion: 1.1 mmol/L — ABNORMAL LOW (ref 1.15–1.40)
HCT: 39 % (ref 36.0–46.0)
HCT: 35 % — ABNORMAL LOW (ref 36.0–46.0)
Hemoglobin: 13.3 g/dL (ref 12.0–15.0)
Hemoglobin: 11.9 g/dL — ABNORMAL LOW (ref 12.0–15.0)
O2 Saturation: 92 %
O2 Saturation: 92 %
Patient temperature: 97.5
Potassium: 4.8 mmol/L (ref 3.5–5.1)
Potassium: 3.9 mmol/L (ref 3.5–5.1)
Sodium: 135 mmol/L (ref 135–145)
Sodium: 138 mmol/L (ref 135–145)
TCO2: 28 mmol/L (ref 22–32)
TCO2: 25 mmol/L (ref 22–32)
pCO2 arterial: 49 mmHg — ABNORMAL HIGH (ref 32.0–48.0)
pCO2 arterial: 45.2 mmHg (ref 32.0–48.0)
pH, Arterial: 7.339 — ABNORMAL LOW (ref 7.350–7.450)
pH, Arterial: 7.333 — ABNORMAL LOW (ref 7.350–7.450)
pO2, Arterial: 65 mmHg — ABNORMAL LOW (ref 83.0–108.0)
pO2, Arterial: 68 mmHg — ABNORMAL LOW (ref 83.0–108.0)

## 2019-01-06 LAB — PROTIME-INR
INR: 1.7 — ABNORMAL HIGH (ref 0.8–1.2)
Prothrombin Time: 19.4 seconds — ABNORMAL HIGH (ref 11.4–15.2)

## 2019-01-06 LAB — GLUCOSE, CAPILLARY
Glucose-Capillary: 112 mg/dL — ABNORMAL HIGH (ref 70–99)
Glucose-Capillary: 106 mg/dL — ABNORMAL HIGH (ref 70–99)
Glucose-Capillary: 120 mg/dL — ABNORMAL HIGH (ref 70–99)
Glucose-Capillary: 127 mg/dL — ABNORMAL HIGH (ref 70–99)
Glucose-Capillary: 147 mg/dL — ABNORMAL HIGH (ref 70–99)

## 2019-01-06 LAB — BASIC METABOLIC PANEL
Anion gap: 10 (ref 5–15)
BUN: 14 mg/dL (ref 8–23)
CO2: 25 mmol/L (ref 22–32)
Calcium: 8.3 mg/dL — ABNORMAL LOW (ref 8.9–10.3)
Chloride: 100 mmol/L (ref 98–111)
Creatinine, Ser: 0.81 mg/dL (ref 0.44–1.00)
GFR calc Af Amer: >60 mL/min (ref >60)
GFR calc non Af Amer: >60 mL/min (ref >60)
Glucose, Bld: 122 mg/dL — ABNORMAL HIGH (ref 70–99)
Potassium: 4.8 mmol/L (ref 3.5–5.1)
Sodium: 135 mmol/L (ref 135–145)

## 2019-01-06 LAB — CBC
HCT: 39.9 % (ref 36.0–46.0)
Hemoglobin: 12.5 g/dL (ref 12.0–15.0)
MCH: 27.1 pg (ref 26.0–34.0)
MCHC: 31.3 g/dL (ref 30.0–36.0)
MCV: 86.4 fL (ref 80.0–100.0)
Platelets: 152 10*3/uL (ref 150–400)
RBC: 4.62 MIL/uL (ref 3.87–5.11)
RDW: 17.7 % — ABNORMAL HIGH (ref 11.5–15.5)
WBC: 16.5 10*3/uL — ABNORMAL HIGH (ref 4.0–10.5)
nRBC: 0 % (ref 0.0–0.2)

## 2019-01-06 MED ORDER — LEVALBUTEROL HCL 0.63 MG/3ML IN NEBU
0.6300 mg | INHALATION_SOLUTION | Freq: Four times a day (QID) | RESPIRATORY_TRACT | Status: DC
  Administered 2019-01-06 – 2019-01-07 (×5): 0.63 mg via RESPIRATORY_TRACT
  Filled 2019-01-06 (×5): qty 3

## 2019-01-06 MED ORDER — WARFARIN SODIUM 2.5 MG PO TABS
2.5000 mg | ORAL_TABLET | Freq: Once | ORAL | Status: AC
  Administered 2019-01-06: 2.5 mg via ORAL
  Filled 2019-01-06: qty 1

## 2019-01-06 MED ORDER — MORPHINE SULFATE (PF) 4 MG/ML IV SOLN
4.0000 mg | INTRAVENOUS | Status: DC | PRN
  Administered 2019-01-07: 3 mg via INTRAVENOUS
  Administered 2019-01-08 – 2019-01-09 (×3): 4 mg via INTRAVENOUS
  Filled 2019-01-06 (×5): qty 1

## 2019-01-06 MED ORDER — FUROSEMIDE 40 MG PO TABS
40.0000 mg | ORAL_TABLET | Freq: Every day | ORAL | Status: DC
  Administered 2019-01-06 – 2019-01-07 (×2): 40 mg via ORAL
  Filled 2019-01-06 (×3): qty 1

## 2019-01-06 MED ORDER — ENOXAPARIN SODIUM 100 MG/ML ~~LOC~~ SOLN
90.0000 mg | Freq: Two times a day (BID) | SUBCUTANEOUS | Status: DC
  Administered 2019-01-07 – 2019-01-08 (×3): 90 mg via SUBCUTANEOUS
  Filled 2019-01-06 (×4): qty 0.9

## 2019-01-06 MED ORDER — WARFARIN - PHARMACIST DOSING INPATIENT
Freq: Every day | Status: DC
  Administered 2019-01-06: 1
  Administered 2019-01-07: 18:00:00
  Administered 2019-01-08 – 2019-01-09 (×2): 1
  Administered 2019-01-12: 18:00:00

## 2019-01-06 MED ORDER — MORPHINE SULFATE (PF) 2 MG/ML IV SOLN
2.0000 mg | INTRAVENOUS | Status: DC | PRN
  Administered 2019-01-06 (×2): 1 mg via INTRAVENOUS
  Administered 2019-01-06 – 2019-01-10 (×6): 2 mg via INTRAVENOUS
  Filled 2019-01-06 (×8): qty 1

## 2019-01-06 MED ORDER — DEXMEDETOMIDINE HCL IN NACL 400 MCG/100ML IV SOLN
0.4000 ug/kg/h | INTRAVENOUS | Status: DC
  Administered 2019-01-06: 0.4 ug/kg/h via INTRAVENOUS
  Filled 2019-01-06: qty 100

## 2019-01-06 NOTE — Progress Notes (Signed)
      [address]       [place] [address]             [phone]      POD # 1 Left thoracotomy for replacement of epicardial leads  BP 130/63   Pulse 80   Temp 98.7 F (37.1 C) (Oral)   Resp 19   Ht 5\' 8"  (1.727 m)   Wt 88.9 kg   SpO2 (!) 89%   BMI 29.80 kg/m   Intake/Output Summary (Last 24 hours) at 01/06/2019 1742 Last data filed at 01/06/2019 1700 Gross per 24 hour  Intake 538.73 ml  Output 1260 ml  Net -721.27 ml   C/o pain but afraid to use oxycodone due to nausea, will order PRN morphine  Remo Lipps C. Roxan Hockey, MD [facility] [phone]

## 2019-01-06 NOTE — Op Note (Signed)
NAME: Ana Martin, Ana Martin MEDICAL RECORD [id number] ACCOUNT [id number] DATE OF BIRTH:[birth year] FACILITY: [facility] LOCATION: [facility] PHYSICIAN:Yoona Ishii Maryruth Bun, MD  OPERATIVE REPORT  DATE OF PROCEDURE:  01/05/2019  PREOPERATIVE DIAGNOSIS:  Infected  AICD .  POSTOPERATIVE DIAGNOSIS:  Same SURGICAL PROCEDURE:  Mini left thoracotomy, removal of epicardial pacing leads and placement of temporary epicardial pacing lead.  SURGEON:  Lanelle Bal, MD  FIRST ASSISTANT:  Jadene Pierini, PA  BRIEF HISTORY:  The patient is a 63 year old female who has had previous cardiac surgery including WPW ablation many years ago.  Subsequently, developed severe mitral regurgitation and then had mechanical mitral valve replaced by Dr. Berline Lopes.  In 2013 she  was having increasing symptoms of heart failure and poor LV function, followed by Dr. Minna Merritts in [facility].  At that time, attempting a biventricular pacer was challenged by the inability to place a coronary sinus lead.  A small thoracotomy was done.   At that time, epicardial leads were placed for biventricular pacing.  The patient improved symptom-wise after this.  In June of 2020, Dr. Rayann Heman were placed the pacer generator.  Since that time, the patient developed drainage and infection of the  pacemaker pocket.  She saw Dr. Lovena Le last week for removal.  Coordinated with Dr. Lovena Le, was plan to bring the patient to the operating room, remove all the wires and leads and pacer place a temporary pacer.  This was discussed with the patient in  detail and she was agreeable with proceeding.  On reviewing her history, it was apparent that she had had a CT scan in the emergency room at [facility] in July 2019.  Recommending followup on lung nodules which had never been done.  Prior to this  surgery, a CT scan of the chest was performed that did not reveal any worrisome nodules of the lung.  DESCRIPTION OF PROCEDURE:  The patient underwent general endotracheal  anesthesia with arterial line and central line in place.  A double lumen endotracheal tube was placed.  The patient was positioned on the table, so a left anterior thoracotomy could be  performed and simultaneously be able to address the left infraclavicular pacer pocket and have access to the right groin for placement of a temporary wire.  The skin of the neck, chest and abdomen and thighs was prepped with Betadine, draped in a  sterile manner.  Appropriate timeout was performed.  The left lung was not ventilated.  The patient tolerated this.  A mini thoracotomy incision was made through the previous incision in the left chest and carried down through the subcutaneous tissue.   The pacer lead as it passed through the subcutaneous tissue was identified and this was used as a guide to follow to the intercostal space.  Intercostal space was opened into the left chest.  The lung was adherent to the pericardium and to the leads.   With some tedious dissection, we were able to open the capsule around the leads.  There was no evidence of obvious infection.  The leads were then able to be backed out.  With the leads removed, they were divided in the subcutaneous tissue so they could  be removed fully from the left infraclavicular pocket.  A single bipolar temporary pacing lead was placed on the lateral epicardium of the left ventricle and brought out through a separate site in the chest wall and secured in place.  This lead was  tested and did satisfactorily pace with a threshold of 11 millivolts.  The base through a separate site along the chest wall a 28 Blake drain was placed into the chest where we had dissected the lung though the left chest was significantly fibrotic and  there was no large free space of pleura.  With the tube in place, we then placed interrupted sutures in the deep muscle layers running 2-0 Vicryl in subcutaneous tissue, 3-0 subcuticular stitch in skin edges.  Dermabond was applied.  With  this incision  closed and covered Dr. Lovena Le then proceeded to remove the atrial, right ventricular and defibrillation leads transvascular along with debriding the pacer pocket as dictated under separate note.  At the completion of procedure the patient was stable;  however, attempts to wean and extubate her were not successful and she was transferred to the surgical intensive care unit, still on the ventilator.  Estimated blood loss from the thoracotomy portion of the procedure was less than 50 mL.  Sponge and  needle count was reported as correct.  CN/NUANCE  D:01/05/2019 T:01/06/2019 JOB:[id number]

## 2019-01-06 NOTE — Progress Notes (Signed)
ANTICOAGULATION CONSULT NOTE - Initial Consult  Pharmacy Consult for warfarin Indication: MechMVR  Patient Measurements: Height: 5\' 8"  (172.7 cm) Weight: 196 lb (88.9 kg) IBW/kg (Calculated) : 63.9   Vital Signs: Temp: 98 F (36.7 C) (12/03 1215) Temp Source: Oral (12/03 1215) BP: 130/63 (12/03 0849) Pulse Rate: 80 (12/03 1300)  Labs: Recent Labs    01/03/19 1511 01/05/19 0627  01/05/19 2237 01/06/19 0404 01/06/19 0419  HGB  --  15.0   < > 15.3* 12.5 13.3  HCT  --  50.0*   < > 45.0 39.9 39.0  PLT  --  180  --   --  152  --   APTT  --  33  --   --   --   --   LABPROT  --  17.7*  --   --  19.4*  --   INR 1.8* 1.5*  --   --  1.7*  --   CREATININE  --  0.93  --   --  0.81  --    < > = values in this interval not displayed.    Estimated Creatinine Clearance: 81.9 mL/min (by C-G formula based on SCr of 0.81 mg/dL).   Assessment: 63 yo W on warfarin for hx mechanical MVR admitted for ICD removal 12/2 due to pocket infection. Last dose of warfarin was 11/27, held for procedure since. INR on admit was 1.5, up to 1.7 today despite holding dose. Estimated blood loss from procedure was less than 50 ml. No current bleeding per RN.  Planning for temporary pacemaker this admit and permanent ICD in ~4 wks.   Home Warfarin dose 1.25mg  Fri, 2.5 AOD  Goal of Therapy:  INR 2.5 -3.5  Monitor platelets by anticoagulation protocol: Yes   Plan:  Start warfarin 2.5mg  x1 tonight Start enoxaparin 90mg  Q12 hr tomorrow morning per Dr. Lovena Le Monitor daily INR, CBC, plt  Monitor for signs/symptoms of bleeding    Benetta Spar, PharmD, BCPS, BCCP Clinical Pharmacist  Please check AMION for all [facility] phone numbers After 10:00 PM, call [place] [phone]

## 2019-01-06 NOTE — Progress Notes (Signed)
Patient ID: Ana Martin, female   DOB: 01/07/1956, 63 y.o.   MRN: [id number] TCTS DAILY ICU PROGRESS NOTE                   [address]            [place] [address]          [phone]   1 Day Post-Op Procedure(s) (LRB): - MEDTRONIC BIV  ICD SYSTEM REMOVAL AND TEMPORARY PACEMAKER PLACEMENT (N/A) REMOVAL OF EPICARDIAL PACING LEAD PLEURALS X2 WITH PLACEMENT OF TEMPORARY EPICARDIAL PACING LEADS (N/A) LEFT MINI THORACOTOMY (Left)  Total Length of Stay:  LOS: 1 day   Subjective: Patient remains on ventilator on low-dose Precedex, she is alert follows commands lifts her head without difficulty.  Objective: Vital signs in last 24 hours: Temp:  [97.5 F (36.4 C)-98 F (36.7 C)] 98 F (36.7 C) (12/03 0430) Pulse Rate:  [79-80] 80 (12/03 0805) Cardiac Rhythm: Ventricular paced (12/03 0742) Resp:  [12-21] 17 (12/03 0805) BP: (108-166)/(54-92) 134/64 (12/03 0805) SpO2:  [91 %-100 %] 94 % (12/03 0805) Arterial Line BP: (107-173)/(54-77) 143/67 (12/03 0800) FiO2 (%):  [40 %-100 %] 40 % (12/03 0805)  Filed Weights   01/04/19 1003  Weight: 88.9 kg    Weight change:    Hemodynamic parameters for last 24 hours:    Intake/Output from previous day: 12/02 0701 - 12/03 0700 In: 2546.6 [I.V.:2046.6; IV Piggyback:500] Out: 885 [Urine:615; Blood:200; Chest Tube:70]  Intake/Output this shift: No intake/output data recorded.  Current Meds: Scheduled Meds: . acetaminophen  1,000 mg Oral Q6H   Or  . acetaminophen (TYLENOL) oral liquid 160 mg/5 mL  1,000 mg Oral Q6H  . ALPRAZolam  0.5 mg Oral BID  . amiodarone  200 mg Oral QHS  . aspirin EC  81 mg Oral QHS  . atorvastatin  40 mg Oral QHS  . bisacodyl  10 mg Oral Daily  . Chlorhexidine Gluconate Cloth  6 each Topical Daily  . insulin aspart  0-24 Units Subcutaneous TID AC & HS  . levalbuterol  0.63 mg Nebulization Q6H  . levothyroxine  200 mcg Oral Q0600  . losartan  25 mg Oral QHS  . metFORMIN  500 mg Oral  Q breakfast  . metoprolol tartrate  5 mg Intravenous Q6H  . mometasone-formoterol  2 puff Inhalation BID  . senna-docusate  1 tablet Oral QHS  . venlafaxine XR  150 mg Oral QHS  . [START ON 01/10/2019] Vitamin D (Ergocalciferol)  50,000 Units Oral Q Mon   Continuous Infusions: . sodium chloride     PRN Meds:.Place/Maintain arterial line **AND** sodium chloride, [START ON 01/11/2019] acetaminophen, albuterol, fluticasone, hydrALAZINE, Melatonin, ondansetron (ZOFRAN) IV, oxyCODONE, traMADol  General appearance: alert, cooperative and no distress Neurologic: intact Heart: regular rate and rhythm, S1, S2 normal, no murmur, click, rub or gallop Lungs: diminished breath sounds bibasilar Abdomen: soft, non-tender; bowel sounds normal; no masses,  no organomegaly Extremities: extremities normal, atraumatic, no cyanosis or edema and Homans sign is negative, no sign of DVT Wound: No air leak from chest tube  Lab Results: CBC: Recent Labs    01/05/19 0627  01/06/19 0404 01/06/19 0419  WBC 10.7*  --  16.5*  --   HGB 15.0   < > 12.5 13.3  HCT 50.0*   < > 39.9 39.0  PLT 180  --  152  --    < > = values in this interval not displayed.   BMET:  Recent Labs    01/05/19 0627  01/06/19 0404 01/06/19 0419  NA 136   < > 135 135  K 4.2   < > 4.8 4.8  CL 96*  --  100  --   CO2 28  --  25  --   GLUCOSE 124*  --  122*  --   BUN 12  --  14  --   CREATININE 0.93  --  0.81  --   CALCIUM 9.0  --  8.3*  --    < > = values in this interval not displayed.    CMET: Lab Results  Component Value Date   WBC 16.5 (H) 01/06/2019   HGB 13.3 01/06/2019   HCT 39.0 01/06/2019   PLT 152 01/06/2019   GLUCOSE 122 (H) 01/06/2019   ALT 24 01/05/2019   AST 26 01/05/2019   NA 135 01/06/2019   K 4.8 01/06/2019   CL 100 01/06/2019   CREATININE 0.81 01/06/2019   BUN 14 01/06/2019   CO2 25 01/06/2019   TSH 4.139 09/05/2017   INR 1.7 (H) 01/06/2019      PT/INR:  Recent Labs    01/06/19 0404   LABPROT 19.4*  INR 1.7*   Radiology: Dg Chest Port 1 View  Result Date: 01/05/2019 CLINICAL DATA:  Pacemaker. EXAM: PORTABLE CHEST 1 VIEW COMPARISON:  Radiograph earlier this day. CT 01/03/2019 FINDINGS: Left-sided pacemaker has been removed. Endotracheal tube with tip approximately 4.9 cm from the carina. Right internal jugular central line tip projects over the brachiocephalic/SVC confluence. Probable temporary pacemaker lead from an inferior approach projects over the expected right ventricle. Post median sternotomy with prosthetic valve. Left chest tube in place, tip directed laterally and projecting over the soft tissues. There is a tiny left apical pneumothorax. Mild cardiomegaly. Retrocardiac opacity likely combination of atelectasis and small volume pleural fluid. Multiple calcified pulmonary nodules. No definite pulmonary edema. No right pneumothorax. IMPRESSION: 1. Tiny left apical pneumothorax. Left chest tube in place, tip directed laterally and projects over the chest wall soft tissues. 2. Endotracheal tube and right central line in place. 3. Retrocardiac opacity likely combination of atelectasis and pleural fluid. Electronically Signed   By: Keith Rake M.D.   On: 01/05/2019 14:12   Dg C-arm 1-60 Min-no Report  Result Date: 01/05/2019 Fluoroscopy was utilized by the requesting physician.  No radiographic interpretation.   Chest x-ray reviewed this morning not read officially yet.  Stable .ph 7.339, pc02 49 po2 65  At 4 am   Assessment/Plan: S/P Procedure(s) (LRB): - MEDTRONIC BIV  ICD SYSTEM REMOVAL AND TEMPORARY PACEMAKER PLACEMENT (N/A) REMOVAL OF EPICARDIAL PACING LEAD PLEURALS X2 WITH PLACEMENT OF TEMPORARY EPICARDIAL PACING LEADS (N/A) LEFT MINI THORACOTOMY (Left) Mobilize Diuresis Diabetes control Check mechanics and extubated this am  Will need to start lovenox- coumadin soon per cardiology       Grace Isaac 01/06/2019 8:21 AM

## 2019-01-06 NOTE — Procedures (Signed)
Extubation Procedure Note  Patient Details:   Name: ILANY TARDIE DOB: January 20, 1956 MRN: [id number]   Airway Documentation:    Vent end date: 01/06/19 Vent end time: 0918   Evaluation  O2 sats: stable throughout Complications: No apparent complications Patient did tolerate procedure well. Bilateral Breath Sounds: Diminished   Pt extubated at this time, placed on 6L HFNC tolerating well. Pt had +cuff leak  NIF-19 VC 600 MD called and made aware  Pt able to voice   Ciro Backer 01/06/2019, 9:23 AM

## 2019-01-06 NOTE — Progress Notes (Addendum)
Electrophysiology Rounding Note  Patient Name: Ana Martin Date of Encounter: 01/06/2019  Primary Cardiologist: Carlyle Dolly, MD Electrophysiologist: Thompson Grayer, MD -> Dr. Lovena Le (extraction)   Subjective   S/p MDT BIV ICD system removal and temporary PPM placement 01/05/2019  Intubated today. Awake, alert, and following commands  Appreciate TCTS care.   Inpatient Medications    Scheduled Meds: . acetaminophen  1,000 mg Oral Q6H   Or  . acetaminophen (TYLENOL) oral liquid 160 mg/5 mL  1,000 mg Oral Q6H  . ALPRAZolam  0.5 mg Oral BID  . amiodarone  200 mg Oral QHS  . aspirin EC  81 mg Oral QHS  . atorvastatin  40 mg Oral QHS  . bisacodyl  10 mg Oral Daily  . Chlorhexidine Gluconate Cloth  6 each Topical Daily  . insulin aspart  0-24 Units Subcutaneous TID AC & HS  . levalbuterol  0.63 mg Nebulization Q6H  . levothyroxine  200 mcg Oral Q0600  . losartan  25 mg Oral QHS  . metFORMIN  500 mg Oral Q breakfast  . metoprolol tartrate  5 mg Intravenous Q6H  . mometasone-formoterol  2 puff Inhalation BID  . senna-docusate  1 tablet Oral QHS  . venlafaxine XR  150 mg Oral QHS  . [START ON 01/10/2019] Vitamin D (Ergocalciferol)  50,000 Units Oral Q Mon   Continuous Infusions: . sodium chloride     PRN Meds: Place/Maintain arterial line **AND** sodium chloride, [START ON 01/11/2019] acetaminophen, albuterol, fluticasone, hydrALAZINE, Melatonin, ondansetron (ZOFRAN) IV, oxyCODONE, traMADol   Vital Signs    Vitals:   01/06/19 0600 01/06/19 0700 01/06/19 0800 01/06/19 0805  BP:    134/64  Pulse: 80 79 80 80  Resp: 17 15 17 17   Temp:      TempSrc:      SpO2: 92% 92% 95% 94%  Weight:      Height:        Intake/Output Summary (Last 24 hours) at 01/06/2019 0834 Last data filed at 01/06/2019 0700 Gross per 24 hour  Intake 2546.64 ml  Output 885 ml  Net 1661.64 ml   Filed Weights   01/04/19 1003  Weight: 88.9 kg    Physical Exam    GEN- The patient is  fatigued appearing. Intubated. Alert and follows commands.  Head- normocephalic, atraumatic Eyes-  Sclera clear, conjunctiva pink Ears- hearing intact Oropharynx- clear Neck- supple Lungs- Diminished with mechanical breathing sounds.  Heart- Regular rate and rhythm, no murmurs, rubs or gallops. Chest dressing C/D/I. GI- soft, NT, ND, + BS Extremities- no clubbing, cyanosis, or edema Skin- no rash or lesion Psych- flat affect, but appropriate Neuro- strength and sensation are intact  Labs    CBC Recent Labs    01/05/19 0627  01/06/19 0404 01/06/19 0419  WBC 10.7*  --  16.5*  --   HGB 15.0   < > 12.5 13.3  HCT 50.0*   < > 39.9 39.0  MCV 88.0  --  86.4  --   PLT 180  --  152  --    < > = values in this interval not displayed.   Basic Metabolic Panel Recent Labs    01/05/19 0627  01/06/19 0404 01/06/19 0419  NA 136   < > 135 135  K 4.2   < > 4.8 4.8  CL 96*  --  100  --   CO2 28  --  25  --   GLUCOSE 124*  --  122*  --  BUN 12  --  14  --   CREATININE 0.93  --  0.81  --   CALCIUM 9.0  --  8.3*  --    < > = values in this interval not displayed.   Liver Function Tests Recent Labs    01/05/19 0627  AST 26  ALT 24  ALKPHOS 57  BILITOT 0.6  PROT 7.5  ALBUMIN 3.9   No results for input(s): LIPASE, AMYLASE in the last 72 hours. Cardiac Enzymes No results for input(s): CKTOTAL, CKMB, CKMBINDEX, TROPONINI in the last 72 hours.   Telemetry    V paced at 80 (personally reviewed)  Radiology    Dg Chest 2 View  Result Date: 01/05/2019 CLINICAL DATA:  ICD system removal. Temporary pacemaker placement. Infection involving implantable cardioverter-defibrillator. EXAM: CHEST - 2 VIEW COMPARISON:  CT chest without contrast 01/03/2019 FINDINGS: Heart is enlarged. Pacing and defibrillator wires are stable. External leads are stable. Mitral annulus repair again noted. Aeration is improved in both lungs. Minimal residual interstitial changes are evident. No significant  effusions are present. Left shoulder ORIF is again noted. IMPRESSION: 1. Improved aeration without significant residual edema. 2. Stable cardiomegaly. 3. Stable pacing and defibrillator leads. Electronically Signed   By: San Morelle M.D.   On: 01/05/2019 06:54   Dg Chest Port 1 View  Result Date: 01/05/2019 CLINICAL DATA:  Pacemaker. EXAM: PORTABLE CHEST 1 VIEW COMPARISON:  Radiograph earlier this day. CT 01/03/2019 FINDINGS: Left-sided pacemaker has been removed. Endotracheal tube with tip approximately 4.9 cm from the carina. Right internal jugular central line tip projects over the brachiocephalic/SVC confluence. Probable temporary pacemaker lead from an inferior approach projects over the expected right ventricle. Post median sternotomy with prosthetic valve. Left chest tube in place, tip directed laterally and projecting over the soft tissues. There is a tiny left apical pneumothorax. Mild cardiomegaly. Retrocardiac opacity likely combination of atelectasis and small volume pleural fluid. Multiple calcified pulmonary nodules. No definite pulmonary edema. No right pneumothorax. IMPRESSION: 1. Tiny left apical pneumothorax. Left chest tube in place, tip directed laterally and projects over the chest wall soft tissues. 2. Endotracheal tube and right central line in place. 3. Retrocardiac opacity likely combination of atelectasis and pleural fluid. Electronically Signed   By: Keith Rake M.D.   On: 01/05/2019 14:12   Dg C-arm 1-60 Min-no Report  Result Date: 01/05/2019 Fluoroscopy was utilized by the requesting physician.  No radiographic interpretation.    Patient Profile     Ana Martin is a 63 y.o. female with a past medical history significant for AF, CHB, chronic systolic CHF s/p ICD, and ICD system/pocket infection.  she was admitted for ICD extraction and temp/perm pacemaker placement.   Assessment & Plan    1. ICD pocket erosion/infection s/p MDT BIV ICD system removal,  with surgical removal of epicardial leads, and temporary PPM placement 01/05/2019 Stable this am.  She will eventually need permanent pacemaker, as she has CHB s/p AV node ablation, with no junctional escape down to 30 bpm.   2. CHB As above, will requiring permanent PPM/ICD once stable. Timing per MD.   3. Atrial fibrillation Rates well controlled  4. Chronic systolic CHF Echo [id number] LVEF 35% Follow volume status.   For questions or updates, please contact [place] Please consult [email or web address] for contact info under Cardiology/STEMI.  Signed, Shirley Friar, PA-C  01/06/2019, 8:34 AM   EP Attending  Patient seen and examined. Agree with the findings as above. Her  temporary PM in the right femoral vein is working well. She is awake and would like her ET tube removed. I will defer to Dr. Gerilyn Pilgrim as to the timing of this. She will undergo right sided biv ICD next week.   Mikle Bosworth.D.

## 2019-01-06 NOTE — Plan of Care (Signed)
Progressing, extubated to HFNC.  Having some pain issues with chest tube, and some anxiety related to general  issues, medications.    Nursing Dx  Impaired gas exchange related to surgical intervention  IS education done, encourage patient to use frequently Ordered treatments- ask patient what works best for her  Turn nad position in bed, even if it is unconformable.  Wean o2 according to sao2 > 90- wears o2 at home to keep sat 95% at 2LPM

## 2019-01-07 ENCOUNTER — Inpatient Hospital Stay (HOSPITAL_COMMUNITY): Payer: PPO

## 2019-01-07 ENCOUNTER — Encounter (HOSPITAL_COMMUNITY)
Admission: RE | Disposition: A | Discharge status: Home Health | Payer: Self-pay | Source: Home / Self Care | Attending: Internal Medicine

## 2019-01-07 DIAGNOSIS — I442 Atrioventricular block, complete: Secondary | ICD-10-CM

## 2019-01-07 HISTORY — PX: PACEMAKER IMPLANT: EP1218

## 2019-01-07 LAB — COMPREHENSIVE METABOLIC PANEL
ALT: 17 U/L (ref 0–44)
AST: 19 U/L (ref 15–41)
Albumin: 3.4 g/dL — ABNORMAL LOW (ref 3.5–5.0)
Alkaline Phosphatase: 48 U/L (ref 38–126)
Anion gap: 8 (ref 5–15)
BUN: 14 mg/dL (ref 8–23)
CO2: 28 mmol/L (ref 22–32)
Calcium: 8.9 mg/dL (ref 8.9–10.3)
Chloride: 99 mmol/L (ref 98–111)
Creatinine, Ser: 0.75 mg/dL (ref 0.44–1.00)
GFR calc Af Amer: >60 mL/min (ref >60)
GFR calc non Af Amer: >60 mL/min (ref >60)
Glucose, Bld: 171 mg/dL — ABNORMAL HIGH (ref 70–99)
Potassium: 4.8 mmol/L (ref 3.5–5.1)
Sodium: 135 mmol/L (ref 135–145)
Total Bilirubin: 0.6 mg/dL (ref 0.3–1.2)
Total Protein: 6.4 g/dL — ABNORMAL LOW (ref 6.5–8.1)

## 2019-01-07 LAB — GLUCOSE, CAPILLARY
Glucose-Capillary: 141 mg/dL — ABNORMAL HIGH (ref 70–99)
Glucose-Capillary: 133 mg/dL — ABNORMAL HIGH (ref 70–99)
Glucose-Capillary: 139 mg/dL — ABNORMAL HIGH (ref 70–99)
Glucose-Capillary: 146 mg/dL — ABNORMAL HIGH (ref 70–99)

## 2019-01-07 LAB — PROTIME-INR
INR: 1.7 — ABNORMAL HIGH (ref 0.8–1.2)
Prothrombin Time: 20.3 seconds — ABNORMAL HIGH (ref 11.4–15.2)

## 2019-01-07 LAB — CBC
HCT: 41.2 % (ref 36.0–46.0)
Hemoglobin: 12.5 g/dL (ref 12.0–15.0)
MCH: 27.1 pg (ref 26.0–34.0)
MCHC: 30.3 g/dL (ref 30.0–36.0)
MCV: 89.2 fL (ref 80.0–100.0)
Platelets: 151 10*3/uL (ref 150–400)
RBC: 4.62 MIL/uL (ref 3.87–5.11)
RDW: 18.1 % — ABNORMAL HIGH (ref 11.5–15.5)
WBC: 17.8 10*3/uL — ABNORMAL HIGH (ref 4.0–10.5)
nRBC: 0 % (ref 0.0–0.2)

## 2019-01-07 SURGERY — PACEMAKER IMPLANT
Anesthesia: LOCAL

## 2019-01-07 MED ORDER — SODIUM CHLORIDE 0.9 % IV SOLN
80.0000 mg | INTRAVENOUS | Status: DC
  Filled 2019-01-07: qty 2

## 2019-01-07 MED ORDER — HEPARIN (PORCINE) IN NACL 1000-0.9 UT/500ML-% IV SOLN
INTRAVENOUS | Status: DC | PRN
  Administered 2019-01-07: 500 mL

## 2019-01-07 MED ORDER — FUROSEMIDE 10 MG/ML IJ SOLN
60.0000 mg | Freq: Every day | INTRAMUSCULAR | Status: DC
  Administered 2019-01-07 – 2019-01-13 (×7): 60 mg via INTRAVENOUS
  Filled 2019-01-07 (×7): qty 6

## 2019-01-07 MED ORDER — LEVALBUTEROL HCL 0.63 MG/3ML IN NEBU: 0.6300 mg | INHALATION_SOLUTION | Freq: Two times a day (BID) | RESPIRATORY_TRACT | Status: DC

## 2019-01-07 MED ORDER — WARFARIN SODIUM 2.5 MG PO TABS
2.5000 mg | ORAL_TABLET | Freq: Once | ORAL | Status: AC
  Administered 2019-01-07: 2.5 mg via ORAL
  Filled 2019-01-07: qty 1

## 2019-01-07 MED ORDER — CEFAZOLIN SODIUM-DEXTROSE 2-4 GM/100ML-% IV SOLN
INTRAVENOUS | Status: AC
  Filled 2019-01-07: qty 100

## 2019-01-07 MED ORDER — SODIUM CHLORIDE 0.9 % IV SOLN: INTRAVENOUS | Status: DC

## 2019-01-07 MED ORDER — ONDANSETRON HCL 4 MG/2ML IJ SOLN: 4.0000 mg | Freq: Four times a day (QID) | INTRAMUSCULAR | Status: DC | PRN

## 2019-01-07 MED ORDER — LEVALBUTEROL HCL 0.63 MG/3ML IN NEBU
0.6300 mg | INHALATION_SOLUTION | Freq: Two times a day (BID) | RESPIRATORY_TRACT | Status: DC
  Administered 2019-01-07 – 2019-01-14 (×14): 0.63 mg via RESPIRATORY_TRACT
  Filled 2019-01-07 (×15): qty 3

## 2019-01-07 MED ORDER — FENTANYL CITRATE (PF) 100 MCG/2ML IJ SOLN
INTRAMUSCULAR | Status: AC
  Filled 2019-01-07: qty 2

## 2019-01-07 MED ORDER — ACETAMINOPHEN 325 MG PO TABS: 325.0000 mg | ORAL_TABLET | ORAL | Status: DC | PRN

## 2019-01-07 MED ORDER — CEFAZOLIN SODIUM-DEXTROSE 1-4 GM/50ML-% IV SOLN
1.0000 g | Freq: Three times a day (TID) | INTRAVENOUS | Status: DC
  Administered 2019-01-07 – 2019-01-14 (×21): 1 g via INTRAVENOUS
  Filled 2019-01-07 (×26): qty 50

## 2019-01-07 MED ORDER — MIDAZOLAM HCL 5 MG/5ML IJ SOLN
INTRAMUSCULAR | Status: AC
  Filled 2019-01-07: qty 5

## 2019-01-07 MED ORDER — INFLUENZA VAC SPLIT QUAD 0.5 ML IM SUSY
0.5000 mL | PREFILLED_SYRINGE | INTRAMUSCULAR | Status: AC
  Administered 2019-01-08: 0.5 mL via INTRAMUSCULAR

## 2019-01-07 MED ORDER — LIDOCAINE HCL (PF) 1 % IJ SOLN
INTRAMUSCULAR | Status: DC | PRN
  Administered 2019-01-07: 35 mL

## 2019-01-07 MED ORDER — CEFAZOLIN SODIUM-DEXTROSE 2-4 GM/100ML-% IV SOLN
2.0000 g | INTRAVENOUS | Status: AC
  Administered 2019-01-07: 2 g via INTRAVENOUS
  Filled 2019-01-07: qty 100

## 2019-01-07 MED ORDER — FENTANYL CITRATE (PF) 100 MCG/2ML IJ SOLN
INTRAMUSCULAR | Status: DC | PRN
  Administered 2019-01-07: 12.5 ug via INTRAVENOUS

## 2019-01-07 MED ORDER — MIDAZOLAM HCL 5 MG/5ML IJ SOLN
INTRAMUSCULAR | Status: DC | PRN
  Administered 2019-01-07: 1 mg via INTRAVENOUS

## 2019-01-07 SURGICAL SUPPLY — 8 items
CABLE SURGICAL S-101-97-12 (CABLE) ×2 IMPLANT
KIT MICROPUNCTURE NIT STIFF (SHEATH) ×2 IMPLANT
LEAD CAPSURE NOVUS 5076-58CM (Lead) ×2 IMPLANT
PAD PRO RADIOLUCENT 2001M-C (PAD) ×2 IMPLANT
SHEATH 7FR PRELUDE SNAP 13 (SHEATH) ×2 IMPLANT
SHEATH 7FR PRELUDE SNAP 25 (SHEATH) ×2 IMPLANT
SHEATH PROBE COVER 6X72 (BAG) ×2 IMPLANT
TRAY PACEMAKER INSERTION (PACKS) ×2 IMPLANT

## 2019-01-07 NOTE — Progress Notes (Addendum)
Electrophysiology Rounding Note  Patient Name: Ana Martin Date of Encounter: 01/07/2019  Primary Cardiologist: Carlyle Dolly, MD Electrophysiologist: Thompson Grayer, MD -> Dr. Lovena Le (extraction)   Subjective   S/p MDT BIV ICD system removal and temporary PPM placement 01/05/2019  Extubated and alert/oriented this am. Following commands. Denies SOB.   Inpatient Medications    Scheduled Meds:  acetaminophen  1,000 mg Oral Q6H   Or   acetaminophen (TYLENOL) oral liquid 160 mg/5 mL  1,000 mg Oral Q6H   ALPRAZolam  0.5 mg Oral BID   amiodarone  200 mg Oral QHS   aspirin EC  81 mg Oral QHS   atorvastatin  40 mg Oral QHS   bisacodyl  10 mg Oral Daily   Chlorhexidine Gluconate Cloth  6 each Topical Daily   enoxaparin (LOVENOX) injection  90 mg Subcutaneous Q12H   furosemide  40 mg Oral Daily   insulin aspart  0-24 Units Subcutaneous TID AC & HS   levalbuterol  0.63 mg Nebulization Q6H   levothyroxine  200 mcg Oral Q0600   losartan  25 mg Oral QHS   metFORMIN  500 mg Oral Q breakfast   metoprolol tartrate  5 mg Intravenous Q6H   mometasone-formoterol  2 puff Inhalation BID   senna-docusate  1 tablet Oral QHS   venlafaxine XR  150 mg Oral QHS   [START ON 01/10/2019] Vitamin D (Ergocalciferol)  50,000 Units Oral Q Mon   Warfarin - Pharmacist Dosing Inpatient   Does not apply q1800   Continuous Infusions:  sodium chloride     PRN Meds: Place/Maintain arterial line **AND** sodium chloride, [START ON 01/11/2019] acetaminophen, albuterol, fluticasone, hydrALAZINE, Melatonin, morphine injection, morphine injection, ondansetron (ZOFRAN) IV, oxyCODONE, traMADol   Vital Signs    Vitals:   01/07/19 0500 01/07/19 0610 01/07/19 0649 01/07/19 0700  BP: (!) 143/70 (!) 146/77 139/81   Pulse: 80 80 80 80  Resp: (!) 21 17 17 13   Temp:      TempSrc:      SpO2: 91% 91% 90% (!) 89%  Weight:      Height:        Intake/Output Summary (Last 24 hours) at  01/07/2019 0722 Last data filed at 01/07/2019 0600 Gross per 24 hour  Intake 692.09 ml  Output 1380 ml  Net -687.91 ml   Filed Weights   01/04/19 1003  Weight: 88.9 kg    Physical Exam    GEN- The patient is chronically ill appearing. alert and oriented x 3 today.   Head- normocephalic, atraumatic Eyes-  Sclera clear, conjunctiva pink Ears- hearing intact Oropharynx- clear Neck- supple Lungs- Somewhat diminished. Normal work of breathing Heart- Regular rate and rhythm, no murmurs, rubs or gallops GI- soft, NT, ND, + BS Extremities- no clubbing, cyanosis, or edema. RFV temporary PM stable Skin- no rash or lesion Psych- euthymic mood, full affect Neuro- strength and sensation are intact  Labs    CBC Recent Labs    01/06/19 0404  01/06/19 0910 01/07/19 0233  WBC 16.5*  --   --  17.8*  HGB 12.5   < > 11.9* 12.5  HCT 39.9   < > 35.0* 41.2  MCV 86.4  --   --  89.2  PLT 152  --   --  151   < > = values in this interval not displayed.   Basic Metabolic Panel Recent Labs    01/06/19 0404  01/06/19 0910 01/07/19 0233  NA 135   < >  138 135  K 4.8   < > 3.9 4.8  CL 100  --   --  99  CO2 25  --   --  28  GLUCOSE 122*  --   --  171*  BUN 14  --   --  14  CREATININE 0.81  --   --  0.75  CALCIUM 8.3*  --   --  8.9   < > = values in this interval not displayed.   Liver Function Tests Recent Labs    01/05/19 0627 01/07/19 0233  AST 26 19  ALT 24 17  ALKPHOS 57 48  BILITOT 0.6 0.6  PROT 7.5 6.4*  ALBUMIN 3.9 3.4*   No results for input(s): LIPASE, AMYLASE in the last 72 hours. Cardiac Enzymes No results for input(s): CKTOTAL, CKMB, CKMBINDEX, TROPONINI in the last 72 hours.   Telemetry    V paced 80 (personally reviewed)  Radiology    Dg Chest Port 1 View  Result Date: 01/06/2019 CLINICAL DATA:  Intubation.  ICD removed. EXAM: PORTABLE CHEST 1 VIEW COMPARISON:  01/05/2019. FINDINGS: Endotracheal tube, right IJ line, right femoral temporary pacer in  stable position. Tubing noted over the left chest with tip over the left side wall again noted. No interim change from prior exam. Prior median sternotomy and cardiac valve replacement. Stable cardiomegaly and pulmonary venous congestion. Persistent left base atelectasis/infiltrate and small left pleural effusion. No definite pneumothorax noted on today's exam. Left anterolateral fifth rib fracture noted. Prior ORIF proximal left humerus. IMPRESSION: 1.  No definite pneumothorax noted on today's exam. 2. Persistent left base atelectasis/infiltrate small left pleural effusion. 3. Prior median sternotomy and cardiac valve replacement. Lines and tubes in stable position. Tubing noted over the left lower chest wall with tubing tip over the left chest sidewall. No interim change from prior exam. Electronically Signed   By: Marcello Moores  Register   On: 01/06/2019 08:50   Dg Chest Port 1 View  Result Date: 01/05/2019 CLINICAL DATA:  Pacemaker. EXAM: PORTABLE CHEST 1 VIEW COMPARISON:  Radiograph earlier this day. CT 01/03/2019 FINDINGS: Left-sided pacemaker has been removed. Endotracheal tube with tip approximately 4.9 cm from the carina. Right internal jugular central line tip projects over the brachiocephalic/SVC confluence. Probable temporary pacemaker lead from an inferior approach projects over the expected right ventricle. Post median sternotomy with prosthetic valve. Left chest tube in place, tip directed laterally and projecting over the soft tissues. There is a tiny left apical pneumothorax. Mild cardiomegaly. Retrocardiac opacity likely combination of atelectasis and small volume pleural fluid. Multiple calcified pulmonary nodules. No definite pulmonary edema. No right pneumothorax. IMPRESSION: 1. Tiny left apical pneumothorax. Left chest tube in place, tip directed laterally and projects over the chest wall soft tissues. 2. Endotracheal tube and right central line in place. 3. Retrocardiac opacity likely  combination of atelectasis and pleural fluid. Electronically Signed   By: Keith Rake M.D.   On: 01/05/2019 14:12   Dg C-arm 1-60 Min-no Report  Result Date: 01/05/2019 Fluoroscopy was utilized by the requesting physician.  No radiographic interpretation.    Patient Profile     LINDSEE HOLLOPETER is a 63 y.o. female with a past medical history significant for AF, CHB, chronic systolic CHF s/p ICD, and ICD system/pocket infection.  she was admitted for ICD extraction and temp/perm pacemaker placement.  Assessment & Plan    1. ICD pocket erosion/infection s/p MDT BIV ICD system removal, with surgical removal of epicardial leads, and temporary  PPM placement 01/05/2019 RFV temp pacer stable this am.  Per Dr. Lovena Le, planning R sided BIV-ICD next week.   2. CHB Temp pacer functioning well.  Plan BIV-ICD on right side next week as above.  3. Atrial fibrillation Rates well controlled.   4. Mechanical MVR Coumadin restarted last night, and lovenox to start this am.   5. Chronic systolic CHF Echo XX123456 LVEF 35%. Intra-op TEE pending. Follow volume status.   For questions or updates, please contact [place] Please consult [email or web address] for contact info under Cardiology/STEMI.  Signed, Shirley Friar, PA-C  01/07/2019, 7:22 AM    EP Attending  Patient seen and examined. Agree with above. The patient is stable over night and has been extubated. She has some residual pain. Her chest tube has been removed. She has had an increase in her LV epicardial temporary pacing threshold to 20 ma (was 14 mA at implant). Her right femoral pm is working normally but has been in place for 2 days and does not need to be left in over the weekend. For this reason we will place a temp/perm today and remove her right femoral temporary pm. She does not have a fever but her white count is elevated and I will start IV ancef.   Mikle Bosworth.D.

## 2019-01-07 NOTE — Progress Notes (Signed)
Patient ID: Ana Martin, female   DOB: 1955-08-01, 63 y.o.   MRN: [id number] TCTS DAILY ICU PROGRESS NOTE                   [address]            [place] [address]          [id number]   2 Days Post-Op Procedure(s) (LRB): - MEDTRONIC BIV  ICD SYSTEM REMOVAL AND TEMPORARY PACEMAKER PLACEMENT (N/A) REMOVAL OF EPICARDIAL PACING LEAD PLEURALS X2 WITH PLACEMENT OF TEMPORARY EPICARDIAL PACING LEADS (N/A) LEFT MINI THORACOTOMY (Left)  Total Length of Stay:  LOS: 2 days   Subjective: Feels better this am, says breathing better   Objective: Vital signs in last 24 hours: Temp:  [97.3 F (36.3 C)-98.7 F (37.1 C)] 97.8 F (36.6 C) (12/04 1100) Pulse Rate:  [79-99] 99 (12/04 1000) Cardiac Rhythm: Ventricular paced (12/04 0800) Resp:  [12-23] 18 (12/04 1000) BP: (139-174)/(63-81) 139/81 (12/04 0649) SpO2:  [87 %-95 %] 87 % (12/04 1000) Arterial Line BP: (140-169)/(58-69) 140/63 (12/04 1000) FiO2 (%):  [32 %] 32 % (12/04 0932)  Filed Weights   01/04/19 1003  Weight: 88.9 kg    Weight change:    Hemodynamic parameters for last 24 hours:    Intake/Output from previous day: 12/03 0701 - 12/04 0700 In: 692.1 [P.O.:680; I.V.:12.1] Out: 1380 [Urine:1250; Chest Tube:130]  Intake/Output this shift: Total I/O In: -  Out: 75 [Urine:75]  Current Meds: Scheduled Meds: . acetaminophen  1,000 mg Oral Q6H   Or  . acetaminophen (TYLENOL) oral liquid 160 mg/5 mL  1,000 mg Oral Q6H  . ALPRAZolam  0.5 mg Oral BID  . amiodarone  200 mg Oral QHS  . aspirin EC  81 mg Oral QHS  . atorvastatin  40 mg Oral QHS  . bisacodyl  10 mg Oral Daily  . Chlorhexidine Gluconate Cloth  6 each Topical Daily  . enoxaparin (LOVENOX) injection  90 mg Subcutaneous Q12H  . furosemide  40 mg Oral Daily  . [START ON 01/08/2019] influenza vac split quadrivalent PF  0.5 mL Intramuscular Tomorrow-1000  . insulin aspart  0-24 Units Subcutaneous TID AC & HS  . levalbuterol  0.63 mg  Nebulization Q6H  . levothyroxine  200 mcg Oral Q0600  . losartan  25 mg Oral QHS  . metFORMIN  500 mg Oral Q breakfast  . metoprolol tartrate  5 mg Intravenous Q6H  . mometasone-formoterol  2 puff Inhalation BID  . senna-docusate  1 tablet Oral QHS  . venlafaxine XR  150 mg Oral QHS  . [START ON 01/10/2019] Vitamin D (Ergocalciferol)  50,000 Units Oral Q Mon  . Warfarin - Pharmacist Dosing Inpatient   Does not apply q1800   Continuous Infusions: . sodium chloride     PRN Meds:.Place/Maintain arterial line **AND** sodium chloride, [START ON 01/11/2019] acetaminophen, albuterol, fluticasone, hydrALAZINE, Melatonin, morphine injection, morphine injection, ondansetron (ZOFRAN) IV, oxyCODONE, traMADol  General appearance: alert and cooperative Neurologic: intact Heart: regular rate and rhythm, S1, S2 normal, no murmur, click, rub or gallop and paced Lungs: diminished breath sounds bibasilar Abdomen: soft, non-tender; bowel sounds normal; no masses,  no organomegaly Extremities: extremities normal, atraumatic, no cyanosis or edema and Homans sign is negative, no sign of DVT Wound: no air leak, chest to come out, dressing on left subcalvian wound , change per cardiology   Lab Results: CBC: Recent Labs    01/06/19 0404  01/06/19 0910 01/07/19  0233  WBC 16.5*  --   --  17.8*  HGB 12.5   < > 11.9* 12.5  HCT 39.9   < > 35.0* 41.2  PLT 152  --   --  151   < > = values in this interval not displayed.   BMET:  Recent Labs    01/06/19 0404  01/06/19 0910 01/07/19 0233  NA 135   < > 138 135  K 4.8   < > 3.9 4.8  CL 100  --   --  99  CO2 25  --   --  28  GLUCOSE 122*  --   --  171*  BUN 14  --   --  14  CREATININE 0.81  --   --  0.75  CALCIUM 8.3*  --   --  8.9   < > = values in this interval not displayed.    CMET: Lab Results  Component Value Date   WBC 17.8 (H) 01/07/2019   HGB 12.5 01/07/2019   HCT 41.2 01/07/2019   PLT 151 01/07/2019   GLUCOSE 171 (H) 01/07/2019   ALT  17 01/07/2019   AST 19 01/07/2019   NA 135 01/07/2019   K 4.8 01/07/2019   CL 99 01/07/2019   CREATININE 0.75 01/07/2019   BUN 14 01/07/2019   CO2 28 01/07/2019   TSH 4.139 09/05/2017   INR 1.7 (H) 01/07/2019      PT/INR:  Recent Labs    01/07/19 0233  LABPROT 20.3*  INR 1.7*   Radiology: Dg Chest Port 1 View In Am  Result Date: 01/07/2019 CLINICAL DATA:  Shortness of breath. EXAM: PORTABLE CHEST 1 VIEW COMPARISON:  January 06, 2019. FINDINGS: Stable cardiomegaly. Status post cardiac valve repair. Mild central pulmonary vascular congestion is noted. Right internal jugular catheter is unchanged in position. No pneumothorax is noted. Stable left basilar atelectasis or infiltrate is noted with associated pleural effusion. Mildly increased right basilar subsegmental atelectasis is noted. Bony thorax is unremarkable. IMPRESSION: Stable left basilar atelectasis or infiltrate is noted with associated pleural effusion. Mildly increased right basilar subsegmental atelectasis is noted. Stable mild central pulmonary vascular congestion is noted. Electronically Signed   By: Marijo Conception M.D.   On: 01/07/2019 08:21     Assessment/Plan: S/P Procedure(s) (LRB): - MEDTRONIC BIV  ICD SYSTEM REMOVAL AND TEMPORARY PACEMAKER PLACEMENT (N/A) REMOVAL OF EPICARDIAL PACING LEAD PLEURALS X2 WITH PLACEMENT OF TEMPORARY EPICARDIAL PACING LEADS (N/A) LEFT MINI THORACOTOMY (Left) D/c foley and chest tube  Replacement of aicd per cardiology  please call for further assistance     Grace Isaac 01/07/2019 12:25 PM

## 2019-01-07 NOTE — Progress Notes (Signed)
ANTICOAGULATION CONSULT NOTE - Follow Up Consult  Pharmacy Consult for warfarin Indication: MechMVR  Patient Measurements: Height: 5\' 8"  (172.7 cm) Weight: 196 lb (88.9 kg) IBW/kg (Calculated) : 63.9   Vital Signs: Temp: 97.8 F (36.6 C) (12/04 1100) Temp Source: Oral (12/04 1100) BP: 139/81 (12/04 0649) Pulse Rate: 79 (12/04 1300)  Labs: Recent Labs    01/05/19 0627  01/06/19 0404 01/06/19 0419 01/06/19 0910 01/07/19 0233  HGB 15.0   < > 12.5 13.3 11.9* 12.5  HCT 50.0*   < > 39.9 39.0 35.0* 41.2  PLT 180  --  152  --   --  151  APTT 33  --   --   --   --   --   LABPROT 17.7*  --  19.4*  --   --  20.3*  INR 1.5*  --  1.7*  --   --  1.7*  CREATININE 0.93  --  0.81  --   --  0.75   < > = values in this interval not displayed.    Estimated Creatinine Clearance: 82.9 mL/min (by C-G formula based on SCr of 0.75 mg/dL).   Assessment: 63 yo W on warfarin for hx mechanical MVR admitted for ICD removal 12/2 due to pocket infection. Last dose of warfarin was 11/27, then held for procedure. INR on admit was 1.5, up to 1.7 s/p 1 dose warfarin last pm.  Estimated blood loss from procedure was less than 50 ml. No current bleeding.    S/p ICD and lead removal and pocket debridement, temp pacer placed 12/2 - plan ICD replacement next week- Dr Lovena Le would like INR about 2 for procedure  Home Warfarin dose 1.25mg  Fri, 2.5 AOD  Afebrile, WBC 10>17 MRSA PCR negative Cr 0.75, Crcl 2ml/min - will start Ancef 1gm q8h  Goal of Therapy:  INR 2.5 -3.5 (aim for INR 2 until ICD placed next week) Monitor platelets by anticoagulation protocol: Yes   Plan:  Repeat  warfarin 2.5mg  x1 tonight Continue enoxaparin 90mg  Q12 hr  Monitor daily INR, CBC, plt  Monitor for signs/symptoms of bleeding    Bonnita Nasuti Pharm.D. CPP, BCPS Clinical Pharmacist [phone] 01/07/2019 2:30 PM     Please check AMION for all [place] phone numbers After 10:00 PM, call [place] [phone]

## 2019-01-08 ENCOUNTER — Inpatient Hospital Stay (HOSPITAL_COMMUNITY): Payer: PPO

## 2019-01-08 DIAGNOSIS — T827XXS Infection and inflammatory reaction due to other cardiac and vascular devices, implants and grafts, sequela: Secondary | ICD-10-CM

## 2019-01-08 LAB — CBC
HCT: 36.1 % (ref 36.0–46.0)
Hemoglobin: 10.8 g/dL — ABNORMAL LOW (ref 12.0–15.0)
MCH: 26.7 pg (ref 26.0–34.0)
MCHC: 29.9 g/dL — ABNORMAL LOW (ref 30.0–36.0)
MCV: 89.4 fL (ref 80.0–100.0)
Platelets: 113 10*3/uL — ABNORMAL LOW (ref 150–400)
RBC: 4.04 MIL/uL (ref 3.87–5.11)
RDW: 18 % — ABNORMAL HIGH (ref 11.5–15.5)
WBC: 10.9 10*3/uL — ABNORMAL HIGH (ref 4.0–10.5)
nRBC: 0 % (ref 0.0–0.2)

## 2019-01-08 LAB — BASIC METABOLIC PANEL
Anion gap: 8 (ref 5–15)
BUN: 19 mg/dL (ref 8–23)
CO2: 29 mmol/L (ref 22–32)
Calcium: 8.5 mg/dL — ABNORMAL LOW (ref 8.9–10.3)
Chloride: 99 mmol/L (ref 98–111)
Creatinine, Ser: 0.89 mg/dL (ref 0.44–1.00)
GFR calc Af Amer: >60 mL/min (ref >60)
GFR calc non Af Amer: >60 mL/min (ref >60)
Glucose, Bld: 105 mg/dL — ABNORMAL HIGH (ref 70–99)
Potassium: 4.5 mmol/L (ref 3.5–5.1)
Sodium: 136 mmol/L (ref 135–145)

## 2019-01-08 LAB — GLUCOSE, CAPILLARY
Glucose-Capillary: 144 mg/dL — ABNORMAL HIGH (ref 70–99)
Glucose-Capillary: 112 mg/dL — ABNORMAL HIGH (ref 70–99)
Glucose-Capillary: 103 mg/dL — ABNORMAL HIGH (ref 70–99)
Glucose-Capillary: 94 mg/dL (ref 70–99)

## 2019-01-08 LAB — PROTIME-INR
INR: 2.4 — ABNORMAL HIGH (ref 0.8–1.2)
Prothrombin Time: 25.7 seconds — ABNORMAL HIGH (ref 11.4–15.2)

## 2019-01-08 MED ORDER — WARFARIN SODIUM 1 MG PO TABS
1.0000 mg | ORAL_TABLET | Freq: Once | ORAL | Status: AC
  Administered 2019-01-08: 1 mg via ORAL
  Filled 2019-01-08: qty 1

## 2019-01-08 NOTE — Progress Notes (Signed)
Electrophysiology Rounding Note  Patient Name: Ana Martin Date of Encounter: 01/08/2019  Primary Cardiologist: Carlyle Dolly, MD Electrophysiologist: Thompson Grayer, MD -> Dr. Lovena Le (extraction)  Ana Martin a 63 y.o.femalewith a past medical history significant for permanent AF, s/p Av ablation CHB, mechanical mitral valve chronic systolic CHF s/p ICD, and ICD system/pocket infection w pocket erosion .shewas admitted for ICD extraction and temp/perm pacemaker placement.  Subjective   S/p MDT BIV ICD system removalwith surgical lateral thoracotomy for epicardial leads and transvenous extraction of atrial and ICD leads >>  temporary PPM placement 01/05/2019 and ttemp perm pacer 12/4   For device reimplantation R sided next week   Without SOB but very uncomfortable on her back   Inpatient Medications    Scheduled Meds: . acetaminophen  1,000 mg Oral Q6H   Or  . acetaminophen (TYLENOL) oral liquid 160 mg/5 mL  1,000 mg Oral Q6H  . ALPRAZolam  0.5 mg Oral BID  . aspirin EC  81 mg Oral QHS  . atorvastatin  40 mg Oral QHS  . bisacodyl  10 mg Oral Daily  . Chlorhexidine Gluconate Cloth  6 each Topical Daily  . enoxaparin (LOVENOX) injection  90 mg Subcutaneous Q12H  . furosemide  60 mg Intravenous Daily  . influenza vac split quadrivalent PF  0.5 mL Intramuscular Tomorrow-1000  . insulin aspart  0-24 Units Subcutaneous TID AC & HS  . levalbuterol  0.63 mg Nebulization BID  . levothyroxine  200 mcg Oral Q0600  . losartan  25 mg Oral QHS  . metFORMIN  500 mg Oral Q breakfast  . metoprolol tartrate  5 mg Intravenous Q6H  . mometasone-formoterol  2 puff Inhalation BID  . senna-docusate  1 tablet Oral QHS  . venlafaxine XR  150 mg Oral QHS  . [START ON 01/10/2019] Vitamin D (Ergocalciferol)  50,000 Units Oral Q Mon  . Warfarin - Pharmacist Dosing Inpatient   Does not apply q1800   Continuous Infusions: . sodium chloride    .  ceFAZolin (ANCEF) IV Stopped  (01/08/19 1354)   PRN Meds: Place/Maintain arterial line **AND** sodium chloride, [START ON 01/11/2019] acetaminophen, albuterol, fluticasone, hydrALAZINE, Melatonin, morphine injection, morphine injection, ondansetron (ZOFRAN) IV, oxyCODONE, traMADol   Vital Signs    Vitals:   01/08/19 1146 01/08/19 1200 01/08/19 1300 01/08/19 1400  BP: (!) 110/56 (!) 113/56 117/64 118/63  Pulse: 80 80 80 80  Resp: 17 18 16 16   Temp: 98.1 F (36.7 C)     TempSrc: Oral     SpO2:  92% 93% 94%  Weight:      Height:        Intake/Output Summary (Last 24 hours) at 01/08/2019 1514 Last data filed at 01/08/2019 1400 Gross per 24 hour  Intake 546.39 ml  Output 1300 ml  Net -753.61 ml   Filed Weights   01/04/19 1003 01/08/19 0600  Weight: 88.9 kg 92.6 kg    Physical Exam     BP 118/63   Pulse 80   Temp 98.1 F (36.7 C) (Oral)   Resp 16   Ht 5\' 8"  (1.727 m)   Wt 92.6 kg   SpO2 94%   BMI 31.04 kg/m  Well developed and nourished in no acute distress HENT normal Neck supple with JVP  Clear Regular rate and rhythm  mechanical S1 Abd-soft with active BS No Clubbing cyanosis edema Skin-warm and dry A & Oriented  Grossly normal sensory and motor function  Labs    CBC Recent Labs    01/07/19 0233 01/08/19 0354  WBC 17.8* 10.9*  HGB 12.5 10.8*  HCT 41.2 36.1  MCV 89.2 89.4  PLT 151 [id number]*   Basic Metabolic Panel Recent Labs    01/07/19 0233 01/08/19 0354  NA 135 136  K 4.8 4.5  CL 99 99  CO2 28 29  GLUCOSE 171* 105*  BUN 14 19  CREATININE 0.75 0.89  CALCIUM 8.9 8.5*   Liver Function Tests Recent Labs    01/07/19 0233  AST 19  ALT 17  ALKPHOS 48  BILITOT 0.6  PROT 6.4*  ALBUMIN 3.4*   No results for input(s): LIPASE, AMYLASE in the last 72 hours. Cardiac Enzymes No results for input(s): CKTOTAL, CKMB, CKMBINDEX, TROPONINI in the last 72 hours.   Telemetry     Telemetry Personally reviewed  Vpaced  @ 75  Radiology    Dg Chest Port 1 View  Result  Date: 01/07/2019 CLINICAL DATA:  Post pacemaker placement EXAM: PORTABLE CHEST 1 VIEW COMPARISON:  Portable exam 1820 hours compared to 0538 hours FINDINGS: LEFT jugular pacemaker with lead projecting over RIGHT ventricle. RIGHT jugular central venous catheter with tip projecting over SVC. Rotated to the RIGHT. Enlargement of cardiac silhouette post MVR. Pulmonary vascular congestion. LEFT pleural effusion and probable mild asymmetric pulmonary edema, greater on LEFT. No pneumothorax. Orthopedic hardware proximal LEFT humerus. IMPRESSION: Enlargement of cardiac silhouette post pacemaker and MVR with pulmonary vascular congestion, small LEFT pleural effusion, and probable asymmetric pulmonary edema. Electronically Signed   By: Lavonia Dana M.D.   On: 01/07/2019 20:14   Dg Chest Port 1 View In Am  Result Date: 01/07/2019 CLINICAL DATA:  Shortness of breath. EXAM: PORTABLE CHEST 1 VIEW COMPARISON:  January 06, 2019. FINDINGS: Stable cardiomegaly. Status post cardiac valve repair. Mild central pulmonary vascular congestion is noted. Right internal jugular catheter is unchanged in position. No pneumothorax is noted. Stable left basilar atelectasis or infiltrate is noted with associated pleural effusion. Mildly increased right basilar subsegmental atelectasis is noted. Bony thorax is unremarkable. IMPRESSION: Stable left basilar atelectasis or infiltrate is noted with associated pleural effusion. Mildly increased right basilar subsegmental atelectasis is noted. Stable mild central pulmonary vascular congestion is noted. Electronically Signed   By: Marijo Conception M.D.   On: 01/07/2019 08:21    Device interrogation normal this am   Chest Xray personally reviewed   The temp pacer is in the base of the RV hence will use C arm to remove   Assessment & Plan    1. ICD pocket erosion/infection s/p MDT BIV ICD system removal, with surgical removal of epicardial leads, and temporary PPM placement 01/05/2019   2. CHB     3. Atrial fibrillation   4. Mechanical MVR   5. Chronic systolic CHF    Will remove femoral pacemaker today  With INR 1.8>2.4 will stop lovenox as will expect continuing escalation of her INR over the next 24-48 hrs  For device reimplantation next week       For questions or updates, please contact [facility] Please consult [email or web address] for contact info under Cardiology/STEMI.  Signed, Virl Axe, MD  01/08/2019, 3:14 PM

## 2019-01-08 NOTE — Progress Notes (Signed)
Ana Martin [id number]  [id number]  Preop Dx: complete heart block; temp perm placed yesterday with transfemoral pacemaker backup Postop Dx same/   Procedure:removal of transfemoral pacemaker  C arm used for visualization of the transfemoral pacemaker  Removed without difficulty   Cx: none     Virl Axe, MD 01/08/2019 4:07 PM

## 2019-01-08 NOTE — Progress Notes (Addendum)
At approximately 1500 Dr. Caryl Comes requested a c-arm in order to view the patient's temporary pacing wires. He then removed the wires and gave this RN a verbal order to pull the remaining right venous sheath. This was done at 1600.

## 2019-01-08 NOTE — Progress Notes (Signed)
ANTICOAGULATION CONSULT NOTE - Follow Up Consult  Pharmacy Consult for warfarin Indication: MechMVR  Patient Measurements: Height: 5\' 8"  (172.7 cm) Weight: 204 lb 2.3 oz (92.6 kg) IBW/kg (Calculated) : 63.9   Vital Signs: Temp: 98.1 F (36.7 C) (12/05 1146) Temp Source: Oral (12/05 1146) BP: 118/63 (12/05 1400) Pulse Rate: 80 (12/05 1400)  Labs: Recent Labs    01/06/19 0404  01/06/19 0910 01/07/19 0233 01/08/19 0354  HGB 12.5   < > 11.9* 12.5 10.8*  HCT 39.9   < > 35.0* 41.2 36.1  PLT 152  --   --  151 113*  LABPROT 19.4*  --   --  20.3* 25.7*  INR 1.7*  --   --  1.7* 2.4*  CREATININE 0.81  --   --  0.75 0.89   < > = values in this interval not displayed.    Estimated Creatinine Clearance: 76 mL/min (by C-G formula based on SCr of 0.89 mg/dL).   Assessment: 64 yo W on warfarin for hx mechanical MVR admitted for ICD removal 12/2 due to pocket infection. Last dose of warfarin was 11/27, then held for procedure. INR on admit was 1.5.    S/p ICD and lead removal and pocket debridement, temp pacer placed 12/2 - plan ICD replacement next week- Dr Lovena Le would like INR about 2 for procedure.   INR is up 1.7>>2.4 this morning after 2.5mg  the past two days. D/w EP with significant bump in INR this am, will decrease warfarin and hold enoxaparin tonight. Reassess need to restart enoxaparin in am.  Home Warfarin dose 1.25mg  Fri, 2.5 AOD  Goal of Therapy:  INR 2.5 -3.5 (aim for INR 2 until ICD placed next week) Monitor platelets by anticoagulation protocol: Yes   Plan:  Hold enoxaparin tonight Warfarin 1mg  tonight INR in am  Erin Hearing PharmD., BCPS Clinical Pharmacist 01/08/2019 3:08 PM    Please check AMION for all [facility] phone numbers After 10:00 PM, call [place] [phone]

## 2019-01-09 DIAGNOSIS — T82837A Hemorrhage of cardiac prosthetic devices, implants and grafts, initial encounter: Secondary | ICD-10-CM

## 2019-01-09 DIAGNOSIS — I4821 Permanent atrial fibrillation: Secondary | ICD-10-CM

## 2019-01-09 DIAGNOSIS — Z9889 Other specified postprocedural states: Secondary | ICD-10-CM

## 2019-01-09 DIAGNOSIS — Z952 Presence of prosthetic heart valve: Secondary | ICD-10-CM

## 2019-01-09 DIAGNOSIS — I442 Atrioventricular block, complete: Secondary | ICD-10-CM

## 2019-01-09 LAB — BPAM RBC
Blood Product Expiration Date: 202012092359
Blood Product Expiration Date: 202012092359
Blood Product Expiration Date: 202012112359
Blood Product Expiration Date: 202012122359
ISSUE DATE / TIME: 202012020837
ISSUE DATE / TIME: 202012020837
Unit Type and Rh: 9500
Unit Type and Rh: 9500
Unit Type and Rh: 9500
Unit Type and Rh: 9500

## 2019-01-09 LAB — TYPE AND SCREEN
ABO/RH(D): O NEG
Antibody Screen: NEGATIVE
Unit division: 0
Unit division: 0
Unit division: 0
Unit division: 0

## 2019-01-09 LAB — PROTIME-INR
INR: 2.4 — ABNORMAL HIGH (ref 0.8–1.2)
Prothrombin Time: 26.3 seconds — ABNORMAL HIGH (ref 11.4–15.2)

## 2019-01-09 LAB — CBC
HCT: 32.5 % — ABNORMAL LOW (ref 36.0–46.0)
Hemoglobin: 9.8 g/dL — ABNORMAL LOW (ref 12.0–15.0)
MCH: 26.7 pg (ref 26.0–34.0)
MCHC: 30.2 g/dL (ref 30.0–36.0)
MCV: 88.6 fL (ref 80.0–100.0)
Platelets: 123 10*3/uL — ABNORMAL LOW (ref 150–400)
RBC: 3.67 MIL/uL — ABNORMAL LOW (ref 3.87–5.11)
RDW: 17.6 % — ABNORMAL HIGH (ref 11.5–15.5)
WBC: 9.8 10*3/uL (ref 4.0–10.5)
nRBC: 0 % (ref 0.0–0.2)

## 2019-01-09 LAB — GLUCOSE, CAPILLARY
Glucose-Capillary: 95 mg/dL (ref 70–99)
Glucose-Capillary: 142 mg/dL — ABNORMAL HIGH (ref 70–99)
Glucose-Capillary: 88 mg/dL (ref 70–99)
Glucose-Capillary: 145 mg/dL — ABNORMAL HIGH (ref 70–99)
Glucose-Capillary: 134 mg/dL — ABNORMAL HIGH (ref 70–99)

## 2019-01-09 MED ORDER — WARFARIN SODIUM 1 MG PO TABS
1.0000 mg | ORAL_TABLET | Freq: Once | ORAL | Status: AC
  Administered 2019-01-09: 1 mg via ORAL
  Filled 2019-01-09: qty 1

## 2019-01-09 MED ORDER — ENOXAPARIN SODIUM 100 MG/ML ~~LOC~~ SOLN
90.0000 mg | Freq: Two times a day (BID) | SUBCUTANEOUS | Status: DC
  Filled 2019-01-09 (×2): qty 0.9

## 2019-01-09 NOTE — Progress Notes (Addendum)
Electrophysiology Rounding Note  Patient Name: Ana Martin: 01/09/2019  Primary Cardiologist: Carlyle Dolly, MD Electrophysiologist: Thompson Grayer, MD -> Dr. Lovena Le (extraction)  Ana Royle Richardsonis a 63 y.o.femalewith a past medical history significant for permanent AF, s/p Av ablation CHB, mechanical mitral valve chronic systolic CHF s/p ICD, and ICD system/pocket infection w pocket erosion .shewas admitted for ICD extraction and temp/perm pacemaker placement.  Subjective   S/p MDT BIV ICD system removalwith surgical lateral thoracotomy for epicardial leads and transvenous extraction of atrial and ICD leads >>  temporary PPM placement 01/05/2019 and ttemp perm pacer 12/4   For device reimplantation R sided next week   No shortness of breath.  Significant abdominal discomfort and nausea.  Aggravated by morphine which she is taking for her chest tube site  Inpatient Medications    Scheduled Meds: . acetaminophen  1,000 mg Oral Q6H   Or  . acetaminophen (TYLENOL) oral liquid 160 mg/5 mL  1,000 mg Oral Q6H  . ALPRAZolam  0.5 mg Oral BID  . aspirin EC  81 mg Oral QHS  . atorvastatin  40 mg Oral QHS  . bisacodyl  10 mg Oral Daily  . Chlorhexidine Gluconate Cloth  6 each Topical Daily  . enoxaparin (LOVENOX) injection  90 mg Subcutaneous BID  . furosemide  60 mg Intravenous Daily  . insulin aspart  0-24 Units Subcutaneous TID AC & HS  . levalbuterol  0.63 mg Nebulization BID  . levothyroxine  200 mcg Oral Q0600  . losartan  25 mg Oral QHS  . metFORMIN  500 mg Oral Q breakfast  . metoprolol tartrate  5 mg Intravenous Q6H  . mometasone-formoterol  2 puff Inhalation BID  . senna-docusate  1 tablet Oral QHS  . venlafaxine XR  150 mg Oral QHS  . [START ON 01/10/2019] Vitamin D (Ergocalciferol)  50,000 Units Oral Q Mon  . warfarin  1 mg Oral ONCE-1800  . Warfarin - Pharmacist Dosing Inpatient   Does not apply q1800   Continuous Infusions: . sodium  chloride 10 mL/hr at 01/09/19 1000  .  ceFAZolin (ANCEF) IV Stopped (01/09/19 0731)   PRN Meds: Place/Maintain arterial line **AND** sodium chloride, [START ON 01/11/2019] acetaminophen, albuterol, fluticasone, hydrALAZINE, Melatonin, morphine injection, morphine injection, ondansetron (ZOFRAN) IV, oxyCODONE, traMADol   Vital Signs    Vitals:   01/09/19 0743 01/09/19 0800 01/09/19 0900 01/09/19 1000  BP:  (!) 138/53 (!) 120/54 137/80  Pulse:  79 80 80  Resp:  13 15 (!) 31  Temp: 97.7 F (36.5 C)     TempSrc: Oral     SpO2:  91% 97% 93%  Weight:      Height:        Intake/Output Summary (Last 24 hours) at 01/09/2019 1132 Last data filed at 01/09/2019 1000 Gross per 24 hour  Intake 787.7 ml  Output 800 ml  Net -12.3 ml   Filed Weights   01/04/19 1003 01/08/19 0600  Weight: 88.9 kg 92.6 kg    Physical Exam     BP 137/80   Pulse 80   Temp 97.7 F (36.5 C) (Oral)   Resp (!) 31   Ht 5\' 8"  (1.727 m)   Wt 92.6 kg   SpO2 93%   BMI 31.04 kg/m   Well developed and nourished in no acute distress HENT normal Neck supple  Device extraction pocket with significant fullness likely hematoma. Clear Regular rate and rhythm, no murmurs or gallops Abd-soft with  active BS No Clubbing cyanosis edema Skin-warm and dry A & Oriented  Grossly normal sensory and motor function        Labs    CBC Recent Labs    01/08/19 0354 01/09/19 0326  WBC 10.9* 9.8  HGB 10.8* 9.8*  HCT 36.1 32.5*  MCV 89.4 88.6  PLT 113* [id number]   Basic Metabolic Panel Recent Labs    01/07/19 0233 01/08/19 0354  NA 135 136  K 4.8 4.5  CL 99 99  CO2 28 29  GLUCOSE 171* 105*  BUN 14 19  CREATININE 0.75 0.89  CALCIUM 8.9 8.5*   Liver Function Tests Recent Labs    01/07/19 0233  AST 19  ALT 17  ALKPHOS 48  BILITOT 0.6  PROT 6.4*  ALBUMIN 3.4*   No results for input(s): LIPASE, AMYLASE in the last 72 hours. Cardiac Enzymes No results for input(s): CKTOTAL, CKMB, CKMBINDEX, TROPONINI in  the last 72 hours.   Telemetry     Telemetry Personally reviewed  V paced at 80  Radiology    Dg Chest Port 1 View  Result Date: 01/07/2019 CLINICAL DATA:  Post pacemaker placement EXAM: PORTABLE CHEST 1 VIEW COMPARISON:  Portable exam 1820 hours compared to 0538 hours FINDINGS: LEFT jugular pacemaker with lead projecting over RIGHT ventricle. RIGHT jugular central venous catheter with tip projecting over SVC. Rotated to the RIGHT. Enlargement of cardiac silhouette post MVR. Pulmonary vascular congestion. LEFT pleural effusion and probable mild asymmetric pulmonary edema, greater on LEFT. No pneumothorax. Orthopedic hardware proximal LEFT humerus. IMPRESSION: Enlargement of cardiac silhouette post pacemaker and MVR with pulmonary vascular congestion, small LEFT pleural effusion, and probable asymmetric pulmonary edema. Electronically Signed   By: Lavonia Dana M.D.   On: 01/07/2019 20:14   Dg C-arm 1-60 Min-no Report  Result Date: 01/08/2019 Fluoroscopy was utilized by the requesting physician.  No radiographic interpretation.    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  In the context of her pocket hematoma, I have elected to hold it.  I have applied a pressure dressing.    Moderate postoperative anemia We will recheck a CBC in the morning.  Continue Coumadin.  Device reimplantation timing per Dr. Elliot Cousin         For questions or updates, please contact [facility]  Please consult [email or web address] for contact info under Cardiology/STEMI.  Signed, Virl Axe, MD  01/09/2019, 11:32 AM

## 2019-01-09 NOTE — Progress Notes (Signed)
At 1100 Dr. Caryl Comes ordered the patient's lovenox dose held and applied a gauze pressure bandage to the patient's external ICD site.

## 2019-01-09 NOTE — Progress Notes (Addendum)
ANTICOAGULATION CONSULT NOTE - Follow Up Consult  Pharmacy Consult for warfarin Indication: MechMVR  Patient Measurements: Height: 5\' 8"  (172.7 cm) Weight: 204 lb 2.3 oz (92.6 kg) IBW/kg (Calculated) : 63.9   Vital Signs: Temp: 97.7 F (36.5 C) (12/06 0743) Temp Source: Oral (12/06 0743) BP: 137/80 (12/06 1000) Pulse Rate: 80 (12/06 1000)  Labs: Recent Labs    01/07/19 0233 01/08/19 0354 01/09/19 0326  HGB 12.5 10.8* 9.8*  HCT 41.2 36.1 32.5*  PLT 151 113* 123*  LABPROT 20.3* 25.7* 26.3*  INR 1.7* 2.4* 2.4*  CREATININE 0.75 0.89  --     Estimated Creatinine Clearance: 76 mL/min (by C-G formula based on SCr of 0.89 mg/dL).   Assessment: 63 yo W on warfarin for hx mechanical MVR admitted for ICD removal 12/2 due to pocket infection. Last dose of warfarin was 11/27, then held for procedure. INR on admit was 1.5.    S/p ICD and lead removal and pocket debridement, temp pacer placed 12/2 - plan ICD replacement next week- Dr Lovena Le would like INR about 2 for procedure.   INR unchanged overnight 1.7>>2.4>2.4. Lovenox stopped yesterday afternoon, will restart today. Will give low dose warfarin again tonight to try and dial in an INR of 2, will restart lovenox. Hgb trending down 12.5>10.8>9.8, plt low stable 120s. No bleeding noted.   Home Warfarin dose 1.25mg  Fri, 2.5 AOD  Goal of Therapy:  INR 2.5 -3.5 (aim for INR 2 until ICD placed next week) Monitor platelets by anticoagulation protocol: Yes   Plan:  Warfarin 1mg  tonight to get INR closer to 2 Restart lovenox 90mg  q12 hours while INR<2.5  Erin Hearing PharmD., BCPS Clinical Pharmacist 01/09/2019 10:44 AM  Please check AMION for all [place] phone numbers After 10:00 PM, call [place] [phone]

## 2019-01-10 ENCOUNTER — Encounter (HOSPITAL_COMMUNITY): Payer: Self-pay | Admitting: Internal Medicine

## 2019-01-10 ENCOUNTER — Ambulatory Visit: Payer: PPO | Admitting: Cardiology

## 2019-01-10 ENCOUNTER — Inpatient Hospital Stay (HOSPITAL_COMMUNITY): Payer: PPO

## 2019-01-10 DIAGNOSIS — T827XXD Infection and inflammatory reaction due to other cardiac and vascular devices, implants and grafts, subsequent encounter: Secondary | ICD-10-CM

## 2019-01-10 LAB — CBC
HCT: 32.2 % — ABNORMAL LOW (ref 36.0–46.0)
Hemoglobin: 10 g/dL — ABNORMAL LOW (ref 12.0–15.0)
MCH: 27.2 pg (ref 26.0–34.0)
MCHC: 31.1 g/dL (ref 30.0–36.0)
MCV: 87.5 fL (ref 80.0–100.0)
Platelets: 138 10*3/uL — ABNORMAL LOW (ref 150–400)
RBC: 3.68 MIL/uL — ABNORMAL LOW (ref 3.87–5.11)
RDW: 17.4 % — ABNORMAL HIGH (ref 11.5–15.5)
WBC: 8.9 10*3/uL (ref 4.0–10.5)
nRBC: 0 % (ref 0.0–0.2)

## 2019-01-10 LAB — GLUCOSE, CAPILLARY
Glucose-Capillary: 108 mg/dL — ABNORMAL HIGH (ref 70–99)
Glucose-Capillary: 130 mg/dL — ABNORMAL HIGH (ref 70–99)
Glucose-Capillary: 106 mg/dL — ABNORMAL HIGH (ref 70–99)
Glucose-Capillary: 120 mg/dL — ABNORMAL HIGH (ref 70–99)

## 2019-01-10 LAB — PROTIME-INR
INR: 2.1 — ABNORMAL HIGH (ref 0.8–1.2)
Prothrombin Time: 23.4 seconds — ABNORMAL HIGH (ref 11.4–15.2)

## 2019-01-10 MED ORDER — MORPHINE SULFATE (PF) 2 MG/ML IV SOLN: 2.0000 mg | INTRAVENOUS | Status: DC | PRN

## 2019-01-10 MED ORDER — MORPHINE SULFATE (PF) 2 MG/ML IV SOLN
1.0000 mg | INTRAVENOUS | Status: DC | PRN
  Administered 2019-01-10 – 2019-01-11 (×3): 1 mg via INTRAVENOUS
  Filled 2019-01-10 (×3): qty 1

## 2019-01-10 MED ORDER — WARFARIN SODIUM 2 MG PO TABS
2.0000 mg | ORAL_TABLET | Freq: Once | ORAL | Status: AC
  Administered 2019-01-10: 2 mg via ORAL
  Filled 2019-01-10: qty 1

## 2019-01-10 NOTE — Progress Notes (Addendum)
Progress Note  Patient Name: Ana Martin Date of Encounter: 01/10/2019  Primary Cardiologist: Carlyle Dolly, MD   Subjective   C/w pain at lateral op site.  Dressing is clean and dry, epicardial wires remain, no CP, no SOB  Inpatient Medications    Scheduled Meds: . acetaminophen  1,000 mg Oral Q6H   Or  . acetaminophen (TYLENOL) oral liquid 160 mg/5 mL  1,000 mg Oral Q6H  . ALPRAZolam  0.5 mg Oral BID  . aspirin EC  81 mg Oral QHS  . atorvastatin  40 mg Oral QHS  . bisacodyl  10 mg Oral Daily  . Chlorhexidine Gluconate Cloth  6 each Topical Daily  . furosemide  60 mg Intravenous Daily  . insulin aspart  0-24 Units Subcutaneous TID AC & HS  . levalbuterol  0.63 mg Nebulization BID  . levothyroxine  200 mcg Oral Q0600  . losartan  25 mg Oral QHS  . metFORMIN  500 mg Oral Q breakfast  . metoprolol tartrate  5 mg Intravenous Q6H  . mometasone-formoterol  2 puff Inhalation BID  . senna-docusate  1 tablet Oral QHS  . venlafaxine XR  150 mg Oral QHS  . Vitamin D (Ergocalciferol)  50,000 Units Oral Q Mon  . Warfarin - Pharmacist Dosing Inpatient   Does not apply q1800   Continuous Infusions: .  ceFAZolin (ANCEF) IV Stopped (01/10/19 0543)   PRN Meds: [START ON 01/11/2019] acetaminophen, albuterol, fluticasone, hydrALAZINE, Melatonin, morphine injection, morphine injection, ondansetron (ZOFRAN) IV, oxyCODONE, traMADol   Vital Signs    Vitals:   01/10/19 0700 01/10/19 0800 01/10/19 0846 01/10/19 0900  BP: (!) 113/53 108/86  127/61  Pulse: 80 80 80 80  Resp: 15 20 18 12   Temp: (!) 97.2 F (36.2 C)     TempSrc: Oral     SpO2: 97% 93% 92% 94%  Weight:      Height:        Intake/Output Summary (Last 24 hours) at 01/10/2019 1047 Last data filed at 01/10/2019 0900 Gross per 24 hour  Intake 834.88 ml  Output 650 ml  Net 184.88 ml   Last 3 Weights 01/10/2019 01/08/2019 01/04/2019  Weight (lbs) 204 lb 12.9 oz 204 lb 2.3 oz 196 lb  Weight (kg) 92.9 kg 92.6 kg  88.905 kg  Some encounter information is confidential and restricted. Go to Review Flowsheets activity to see all data.      Telemetry    V paced - Personally Reviewed  ECG    No new EKGs - Personally Reviewed  Physical Exam   GEN: No acute distress.   Neck: No JVD Cardiac: RRR, no murmurs, rubs, or gallops.  Respiratory: CTA b/l. GI: Soft, nontender, non-distended  MS: No edema; No deformity. Neuro:  Nonfocal  Psych: Normal affect   L neck, temp perm is in place, pressure dressing remains on extraction op site (not to remove per Dr. Lovena Le) R groin site stable, no bleeding or hematoma L thoracotomy dressing is clean and dry, epicardial wires present  Labs    High Sensitivity Troponin:  No results for input(s): TROPONINIHS in the last 720 hours.    Chemistry Recent Labs  Lab 01/05/19 0627  01/06/19 0404  01/06/19 0910 01/07/19 0233 01/08/19 0354  NA 136   < > 135   < > 138 135 136  K 4.2   < > 4.8   < > 3.9 4.8 4.5  CL 96*  --  100  --   --  99 99  CO2 28  --  25  --   --  28 29  GLUCOSE 124*  --  122*  --   --  171* 105*  BUN 12  --  14  --   --  14 19  CREATININE 0.93  --  0.81  --   --  0.75 0.89  CALCIUM 9.0  --  8.3*  --   --  8.9 8.5*  PROT 7.5  --   --   --   --  6.4*  --   ALBUMIN 3.9  --   --   --   --  3.4*  --   AST 26  --   --   --   --  19  --   ALT 24  --   --   --   --  17  --   ALKPHOS 57  --   --   --   --  48  --   BILITOT 0.6  --   --   --   --  0.6  --   GFRNONAA >60  --  >60  --   --  >60 >60  GFRAA >60  --  >60  --   --  >60 >60  ANIONGAP 12  --  10  --   --  8 8   < > = values in this interval not displayed.     Hematology Recent Labs  Lab 01/08/19 0354 01/09/19 0326 01/10/19 0238  WBC 10.9* 9.8 8.9  RBC 4.04 3.67* 3.68*  HGB 10.8* 9.8* 10.0*  HCT 36.1 32.5* 32.2*  MCV 89.4 88.6 87.5  MCH 26.7 26.7 27.2  MCHC 29.9* 30.2 31.1  RDW 18.0* 17.6* 17.4*  PLT 113* 123* 138*    BNPNo results for input(s): BNP, PROBNP in the  last 168 hours.   DDimer No results for input(s): DDIMER in the last 168 hours.   Radiology    Dg C-arm 1-60 Min-no Report  Result Date: 01/08/2019 Fluoroscopy was utilized by the requesting physician.  No radiographic interpretation.    Cardiac Studies   Unable to track down inter-op TEE report  Patient Profile     63 y.o. female AFib/atypical flutter, HTN, HLD, LBBB, VHD w/mechanical MVR, CHB (s/p AVNode ablation), chronic CHF (systolic) w/CRT-D (s/p gen change June 2020), smoker  Admitted to undergo ICD system extraction 2/2 pocket infection/erosion  Assessment & Plan    1. ICD pocket infection, skin erosion     01/05/2019     S/p device system extraction, trans-venous extraction of intravascular leads and lateral-thorcotomy with CTS for removal of epicardial wires as well     Temp-perm placement     POD #5 today  Dr. Caryl Comes noted this weekend a dip in her Hgb and development of site pocket hematoma over the weekend, place pressured dressing Hgb stable from 12/5  Also over the weekend removed femoral temp pacing wire  CT removed  01/07/2019   Dr. Lovena Le saw and examined pt today  Plan for CRT-P Wed (inter-op TEE with LVEF 40's verbally by Dr. Lovena Le, I am unable to find the report) Remains on Ancef IV q8   2. AFib (permanent) 3. Mechanical MVR     INR today 2.1, d/w Dr. Lovena Le, ok to allow INR low 2's (Pharmacy managing warfarin)  4. HTN     No changes today  Appreciate nursing, encourage OOB to help with mobility, constipation Minimize opiates to pt  tolerance  Discussed importance of mobilization and minimizing her pain meds, she was agreeable to reduction in morphine dose.  We will plan to start mobilize, will not stop it today She declines oxycodone, gives her stomach aches, though denies the morphine does this to her Encouraged her to to use PO tylenol, toradol      For questions or updates, please contact [facility] Please consult [email or web address] for  contact info under   Signed, Baldwin [place], PA-C  01/10/2019, 10:47 AM    EP Attending  Patient seen and examined. Agree with the findings as noted above. The patient looks better today after undergoing temp/perm PM on Friday. She has had her femoral venous temporary PM placed. She has a pocket hematoma after extraction. She has some incisional pain. She has been on IV anti-biotics and will continue and we plan for biv PM insertion on Wednesday. We will progress activity today. We will try and keep her INR in the low 2's. If below, could give an injection or two of lovenox but prefer not to with her pocket hematoma.  Mikle Bosworth.D.

## 2019-01-10 NOTE — Evaluation (Signed)
Physical Therapy Evaluation Patient Details Name: Ana Martin MRN: [id number] DOB: Nov 28, 1955 Today's Date: 01/10/2019   History of Present Illness  Patient is a 63 y/o female who presents with Infected pacemaker generator system now s/p  Left thoracotomy for replacement of epicardial leads 12/2. Pt with development of site pocket hematoma. Planning R sided BIV-ICD this week.  PMH includes A-fib, WPW syndrome, AICD, HTN, mechnical heart valve.  Clinical Impression  Patient presents with generalized weakness, lethargy, impaired balance and impaired mobility s/p above. Pt reports being independent PTA and lives with her b/f and son. Today, pt tolerated bed mobility, transfers and short distance ambulation with min A for balance/safety. Pt with no dizziness. VSS throughout. Needed constant cues to limit using LUE for functional tasks/mobility. Will follow acutely to maximize independence and mobility prior to return home.     Follow Up Recommendations Home health PT;Supervision for mobility/OOB    Equipment Recommendations  Rolling walker with 5" wheels    Recommendations for Other Services       Precautions / Restrictions Precautions Precautions: ICD/Pacemaker Restrictions Weight Bearing Restrictions: Yes RLE Weight Bearing: Partial weight bearing      Mobility  Bed Mobility Overal bed mobility: Needs Assistance Bed Mobility: Supine to Sit     Supine to sit: Min assist;HOB elevated     General bed mobility comments: Assist with scooting to EOB. No dizziness. Left SCDs donned due to getting dizzy earlier when getting up to [facility]. Stood from [employer], from [facility] x1, transferred to chair. Cues to adhere from using LUE to push.  Transfers Overall transfer level: Needs assistance Equipment used: 2 person hand held assist Transfers: Sit to/from Stand Sit to Stand: Min assist;+2 physical assistance         General transfer comment: Assist of 2 to power to standing; able to take  a few steps to get to [facility] with constant cues for stepping/sequencing.  Ambulation/Gait Ambulation/Gait assistance: Min assist;+2 safety/equipment Gait Distance (Feet): 8 Feet Assistive device: Rolling walker (2 wheeled) Gait Pattern/deviations: Step-through pattern;Shuffle;Decreased stride length Gait velocity: decreased   General Gait Details: Slow, mildly unsteady gait with verbal cues to step through and for RW proximity. Lethargic at times.  Stairs            Wheelchair Mobility    Modified Rankin (Stroke Patients Only)       Balance Overall balance assessment: Needs assistance Sitting-balance support: Feet supported;No upper extremity supported Sitting balance-Leahy Scale: Fair     Standing balance support: During functional activity Standing balance-Leahy Scale: Poor Standing balance comment: requires external support and UE support.                             Pertinent Vitals/Pain Pain Assessment: No/denies pain    Home Living Family/patient expects to be discharged to:: Private residence Living Arrangements: Spouse/significant other;Children(bf and son) Available Help at Discharge: Family;Available 24 hours/day Type of Home: House Home Access: Stairs to enter Entrance Stairs-Rails: Right;Left;Can reach both Entrance Stairs-Number of Steps: 3 Home Layout: One level Home Equipment: Bedside commode;Shower seat      Prior Function Level of Independence: Independent         Comments: Does own ADls/IADLs. Drives.     Hand Dominance        Extremity/Trunk Assessment   Upper Extremity Assessment Upper Extremity Assessment: Defer to OT evaluation    Lower Extremity Assessment Lower Extremity Assessment: Generalized weakness  Communication   Communication: No difficulties  Cognition Arousal/Alertness: Lethargic Behavior During Therapy: WFL for tasks assessed/performed Overall Cognitive Status: No family/caregiver present  to determine baseline cognitive functioning                                 General Comments: Eyes closed for part of session, "I stay sleepy." Able to be aroused appropriately. Oriented vaguely to date, thought it was Sunday the 6th of December. Follows 1 step commands. needs reinforcement not to use LUE for functional tasks.      General Comments General comments (skin integrity, edema, etc.): VSS throughout. Not a great pleth reading for Sp02. No SOB noted. Pt on 4L/,min 02 [place].    Exercises     Assessment/Plan    PT Assessment Patient needs continued PT services  PT Problem List Decreased strength;Decreased mobility;Decreased safety awareness;Decreased knowledge of precautions;Decreased activity tolerance;Decreased cognition;Decreased balance;Cardiopulmonary status limiting activity;Decreased skin integrity       PT Treatment Interventions Therapeutic activities;Gait training;Therapeutic exercise;Patient/family education;DME instruction;Balance training;Functional mobility training;Stair training    PT Goals (Current goals can be found in the Care Plan section)  Acute Rehab PT Goals Patient Stated Goal: to go home PT Goal Formulation: With patient Time For Goal Achievement: 01/24/19 Potential to Achieve Goals: Good    Frequency Min 3X/week   Barriers to discharge        Co-evaluation               AM-PAC PT "6 Clicks" Mobility  Outcome Measure Help needed turning from your back to your side while in a flat bed without using bedrails?: A Little Help needed moving from lying on your back to sitting on the side of a flat bed without using bedrails?: A Little Help needed moving to and from a bed to a chair (including a wheelchair)?: A Little Help needed standing up from a chair using your arms (e.g., wheelchair or bedside chair)?: A Little Help needed to walk in hospital room?: A Little Help needed climbing 3-5 steps with a railing? : A Lot 6 Click  Score: 17    End of Session Equipment Utilized During Treatment: Oxygen;Gait belt Activity Tolerance: Patient tolerated treatment well Patient left: in chair;with call bell/phone within reach(RN aware no chair alarm pad- said not needed) Nurse Communication: Mobility status PT Visit Diagnosis: Muscle weakness (generalized) (M62.81);Difficulty in walking, not elsewhere classified (R26.2);Unsteadiness on feet (R26.81)    Time: [id number] PT Time Calculation (min) (ACUTE ONLY): 22 min   Charges:   PT Evaluation $PT Eval Moderate Complexity: 1 Mod          Marisa Severin, PT, DPT Acute Rehabilitation Services Pager [phone] Office [phone]      Marguarite Arbour A Sabra Heck 01/10/2019, 3:58 PM

## 2019-01-10 NOTE — Progress Notes (Signed)
Called to give report to receiving RN.  All patients belongings gathered and patient made aware of transport.

## 2019-01-10 NOTE — Progress Notes (Signed)
Called into patient room, sats 90%, patient states that she can not breathe, diaphoretic and anxious.  Lungs sounds extremely diminished in the bases.  Encouraged patient to cough, patient coughed up a large dark red clot.  Albuterol treatment given, transfer on hold at this time.  Xanax given early for patient anxiety, PA on call notified.  Will monitor for a short period of time prior to transfer.    Patient now with sats now 97% on 4L [place] after albuterol treatment. Report given to oncoming RN, with transfer still to take place soon after patient feeling better.

## 2019-01-10 NOTE — Progress Notes (Signed)
ANTICOAGULATION CONSULT NOTE - Follow Up Consult  Pharmacy Consult for warfarin Indication: MechMVR  Patient Measurements: Height: 5\' 8"  (172.7 cm) Weight: 204 lb 12.9 oz (92.9 kg) IBW/kg (Calculated) : 63.9   Vital Signs: Temp: 98.2 F (36.8 C) (12/07 1100) Temp Source: Oral (12/07 1100) BP: 136/61 (12/07 1100) Pulse Rate: 80 (12/07 1100)  Labs: Recent Labs    01/08/19 0354 01/09/19 0326 01/10/19 0238  HGB 10.8* 9.8* 10.0*  HCT 36.1 32.5* 32.2*  PLT 113* 123* 138*  LABPROT 25.7* 26.3* 23.4*  INR 2.4* 2.4* 2.1*  CREATININE 0.89  --   --     Estimated Creatinine Clearance: 76.1 mL/min (by C-G formula based on SCr of 0.89 mg/dL).   Assessment: 63 yo W on warfarin for hx mechanical MVR admitted for ICD removal 12/2 due to pocket infection. Last dose of warfarin was 11/27, then held for procedure. INR on admit was 1.5.    S/p ICD and lead removal and pocket debridement, temp pacer placed 12/2 - plan ICD replacement next week- Dr Lovena Le would like INR about 2 for procedure.   INR down to 2.1. Lovenox stopped 12/5 and held yesterday d/t pocket hematoma. Hgb stable at 10 this morning, plt low stable 130s. No bleeding noted.   Home Warfarin dose 1.25mg  Fri, 2.5 AOD  Goal of Therapy:  INR 2.5 -3.5 (aim for INR 2 until ICD placed next week) Monitor platelets by anticoagulation protocol: Yes   Plan:  Warfarin 2mg  tonight to keep INR closer to 2 Plan for CRT-P on 12/9  Erin Hearing PharmD., BCPS Clinical Pharmacist 01/10/2019 2:50 PM  Please check AMION for all [place] phone numbers After 10:00 PM, call [place] [phone]

## 2019-01-10 NOTE — Plan of Care (Signed)
Patient reports not getting out of bed since admission, no BM since before admission.  Stool softner administered, patietn reports that she does not feel hungry and has not had an appetite since admission.  Reports that she is having a lot of Left chest pain, however refusing to take oral pain medication, will take Morphine only.   Will attempt to get patient to chair today and encourage PO pain medication, BSC brought in to bedside.

## 2019-01-10 NOTE — Evaluation (Signed)
Occupational Therapy Evaluation Patient Details Name: Ana Martin MRN: [id number] DOB: 1955/07/03 Today's Date: 01/10/2019    History of Present Illness Patient is a 63 y/o female who presents with Infected pacemaker generator system now s/p  Left thoracotomy for replacement of epicardial leads 12/2. Pt with development of site pocket hematoma. Planning R sided BIV-ICD this week.  PMH includes A-fib, WPW syndrome, AICD, HTN, mechnical heart valve.   Clinical Impression   This 63 yo female admitted and underwent above presents to acute OT with tiredness, generalized weakness, decreased balance, limited use of LUE due to pacer all affecting her safety and independence with basic ADLs. PLOF pt was totally independent with basic and IADLs. She will benefit from acute OT with follow up [place].    Follow Up Recommendations  Home health OT;Supervision/Assistance - 24 hour    Equipment Recommendations  None recommended by OT       Precautions / Restrictions Precautions Precautions: ICD/Pacemaker Restrictions Weight Bearing Restrictions: Yes RLE Weight Bearing: Non weight bearing      Mobility Bed Mobility Overal bed mobility: Needs Assistance Bed Mobility: Supine to Sit     Supine to sit: Min assist;HOB elevated     General bed mobility comments: Assist with scooting to EOB. No dizziness. Left SCDs donned due to getting dizzy earlier when getting up to [facility]. Stood from [employer], from [facility] x1, transferred to chair. Cues to adhere from using LUE to push.  Transfers Overall transfer level: Needs assistance Equipment used: 2 person hand held assist Transfers: Sit to/from Stand Sit to Stand: Min assist;+2 physical assistance         General transfer comment: Assist of 2 to power to standing; able to take a few steps to get to [facility] with constant cues for stepping/sequencing.    Balance Overall balance assessment: Needs assistance Sitting-balance support: Feet supported;No upper  extremity supported Sitting balance-Leahy Scale: Fair     Standing balance support: During functional activity Standing balance-Leahy Scale: Poor Standing balance comment: requires external support and UE support.                           ADL either performed or assessed with clinical judgement   ADL Overall ADL's : Needs assistance/impaired Eating/Feeding: Independent;Sitting   Grooming: Minimal assistance;Sitting   Upper Body Bathing: Minimal assistance;Sitting   Lower Body Bathing: Moderate assistance Lower Body Bathing Details (indicate cue type and reason): Min A sit<>stand Upper Body Dressing : Moderate assistance;Sitting   Lower Body Dressing: Maximal assistance Lower Body Dressing Details (indicate cue type and reason): Min A sit<>stand Toilet Transfer: Minimal assistance;Stand-pivot;BSC   Toileting- Clothing Manipulation and Hygiene: Minimal assistance;Sitting/lateral lean               Vision Patient Visual Report: No change from baseline              Pertinent Vitals/Pain Pain Assessment: No/denies pain     Hand Dominance Right   Extremity/Trunk Assessment Upper Extremity Assessment Upper Extremity Assessment: LUE deficits/detail LUE Deficits / Details: minmal use right now at shoulder due to external pacing wires   Lower Extremity Assessment Lower Extremity Assessment: Generalized weakness       Communication Communication Communication: No difficulties   Cognition Arousal/Alertness: Lethargic Behavior During Therapy: WFL for tasks assessed/performed Overall Cognitive Status: No family/caregiver present to determine baseline cognitive functioning  General Comments: Eyes closed for part of session, "I stay sleepy." Able to be aroused appropriately. Oriented vaguely to date, thought it was Sunday the 6th of December. Follows 1 step commands. needs reinforcement not to use LUE for  functional tasks.   General Comments  VSS throughout. Not a great pleth reading for Sp02. No SOB noted. Pt on 4L/,min 02 [place].            Home Living Family/patient expects to be discharged to:: Private residence Living Arrangements: Spouse/significant other;Children Available Help at Discharge: Family;Available 24 hours/day Type of Home: House Home Access: Stairs to enter [employer] of Steps: 3 Entrance Stairs-Rails: Right;Left;Can reach both Home Layout: One level     Bathroom Shower/Tub: Occupational psychologist: Handicapped height     Home Equipment: Bedside commode;Shower seat          Prior Functioning/Environment Level of Independence: Independent        Comments: Does own ADls/IADLs. Drives.        OT Problem List: Decreased strength;Decreased range of motion;Impaired balance (sitting and/or standing)      OT Treatment/Interventions: Self-care/ADL training;DME and/or AE instruction;Patient/family education;Balance training    OT Goals(Current goals can be found in the care plan section) Acute Rehab OT Goals Patient Stated Goal: to go home OT Goal Formulation: With patient Time For Goal Achievement: 01/24/19 Potential to Achieve Goals: Good  OT Frequency: Min 2X/week           Co-evaluation PT/OT/SLP Co-Evaluation/Treatment: Yes Reason for Co-Treatment: For patient/therapist safety;To address functional/ADL transfers   OT goals addressed during session: ADL's and self-care      AM-PAC OT "6 Clicks" Daily Activity     Outcome Measure Help from another person eating meals?: None Help from another person taking care of personal grooming?: A Little Help from another person toileting, which includes using toliet, bedpan, or urinal?: A Little Help from another person bathing (including washing, rinsing, drying)?: A Lot Help from another person to put on and taking off regular upper body clothing?: A Lot Help from another person to  put on and taking off regular lower body clothing?: A Lot 6 Click Score: 16   End of Session Equipment Utilized During Treatment: Gait belt;Rolling walker Nurse Communication: Mobility status(Checked with RN--no chair alarm needed)  Activity Tolerance: Patient limited by lethargy Patient left: in chair;with call bell/phone within reach  OT Visit Diagnosis: Unsteadiness on feet (R26.81);Other abnormalities of gait and mobility (R26.89);Muscle weakness (generalized) (M62.81)                Time: [id number] OT Time Calculation (min): 22 min Charges:  OT General Charges $OT Visit: 1 Visit OT Evaluation $OT Eval Moderate Complexity: [place], OTR/L Acute [employer] Pager [phone] Office [phone]     Almon Register 01/10/2019, 4:31 PM

## 2019-01-11 ENCOUNTER — Inpatient Hospital Stay (HOSPITAL_COMMUNITY): Payer: PPO

## 2019-01-11 LAB — CBC
HCT: 30.7 % — ABNORMAL LOW (ref 36.0–46.0)
Hemoglobin: 9.5 g/dL — ABNORMAL LOW (ref 12.0–15.0)
MCH: 27.2 pg (ref 26.0–34.0)
MCHC: 30.9 g/dL (ref 30.0–36.0)
MCV: 88 fL (ref 80.0–100.0)
Platelets: 156 10*3/uL (ref 150–400)
RBC: 3.49 MIL/uL — ABNORMAL LOW (ref 3.87–5.11)
RDW: 17.2 % — ABNORMAL HIGH (ref 11.5–15.5)
WBC: 9.6 10*3/uL (ref 4.0–10.5)
nRBC: 0 % (ref 0.0–0.2)

## 2019-01-11 LAB — BASIC METABOLIC PANEL
Anion gap: 10 (ref 5–15)
BUN: 19 mg/dL (ref 8–23)
CO2: 33 mmol/L — ABNORMAL HIGH (ref 22–32)
Calcium: 9.1 mg/dL (ref 8.9–10.3)
Chloride: 94 mmol/L — ABNORMAL LOW (ref 98–111)
Creatinine, Ser: 0.66 mg/dL (ref 0.44–1.00)
GFR calc Af Amer: >60 mL/min (ref >60)
GFR calc non Af Amer: >60 mL/min (ref >60)
Glucose, Bld: 127 mg/dL — ABNORMAL HIGH (ref 70–99)
Potassium: 4.3 mmol/L (ref 3.5–5.1)
Sodium: 137 mmol/L (ref 135–145)

## 2019-01-11 LAB — BLOOD GAS, ARTERIAL
Acid-Base Excess: 10.5 mmol/L — ABNORMAL HIGH (ref 0.0–2.0)
Acid-Base Excess: 11.7 mmol/L — ABNORMAL HIGH (ref 0.0–2.0)
Bicarbonate: 36.5 mmol/L — ABNORMAL HIGH (ref 20.0–28.0)
Bicarbonate: 36.9 mmol/L — ABNORMAL HIGH (ref 20.0–28.0)
Drawn by: 448981
Drawn by: 535471
FIO2: 32
FIO2: 24
O2 Saturation: 98.8 %
O2 Saturation: 90.1 %
Patient temperature: 36.5
Patient temperature: 37
pCO2 arterial: 69.3 mmHg (ref 32.0–48.0)
pCO2 arterial: 60.7 mmHg — ABNORMAL HIGH (ref 32.0–48.0)
pH, Arterial: 7.341 — ABNORMAL LOW (ref 7.350–7.450)
pH, Arterial: 7.401 (ref 7.350–7.450)
pO2, Arterial: 124 mmHg — ABNORMAL HIGH (ref 83.0–108.0)
pO2, Arterial: 59.5 mmHg — ABNORMAL LOW (ref 83.0–108.0)

## 2019-01-11 LAB — PROTIME-INR
INR: 1.9 — ABNORMAL HIGH (ref 0.8–1.2)
Prothrombin Time: 22 seconds — ABNORMAL HIGH (ref 11.4–15.2)

## 2019-01-11 LAB — GLUCOSE, CAPILLARY
Glucose-Capillary: 131 mg/dL — ABNORMAL HIGH (ref 70–99)
Glucose-Capillary: 122 mg/dL — ABNORMAL HIGH (ref 70–99)
Glucose-Capillary: 118 mg/dL — ABNORMAL HIGH (ref 70–99)
Glucose-Capillary: 100 mg/dL — ABNORMAL HIGH (ref 70–99)

## 2019-01-11 LAB — SURGICAL PCR SCREEN
MRSA, PCR: NEGATIVE
Staphylococcus aureus: NEGATIVE

## 2019-01-11 MED ORDER — CHLORHEXIDINE GLUCONATE 4 % EX LIQD
60.0000 mL | Freq: Once | CUTANEOUS | Status: AC
  Administered 2019-01-11: 4 via TOPICAL
  Filled 2019-01-11: qty 60

## 2019-01-11 MED ORDER — CEFAZOLIN SODIUM-DEXTROSE 2-4 GM/100ML-% IV SOLN: 2.0000 g | INTRAVENOUS | Status: DC

## 2019-01-11 MED ORDER — CHLORHEXIDINE GLUCONATE 4 % EX LIQD: 60.0000 mL | Freq: Once | CUTANEOUS | Status: DC

## 2019-01-11 MED ORDER — SODIUM CHLORIDE 0.9 % IV SOLN: 80.0000 mg | INTRAVENOUS | Status: DC

## 2019-01-11 MED ORDER — SODIUM CHLORIDE 0.9 % IV SOLN
80.0000 mg | INTRAVENOUS | Status: AC
  Administered 2019-01-12: 80 mg
  Filled 2019-01-11: qty 2

## 2019-01-11 MED ORDER — CHLORHEXIDINE GLUCONATE 4 % EX LIQD
60.0000 mL | Freq: Once | CUTANEOUS | Status: AC
  Administered 2019-01-12: 4 via TOPICAL
  Filled 2019-01-11: qty 60

## 2019-01-11 MED ORDER — ENOXAPARIN SODIUM 100 MG/ML ~~LOC~~ SOLN
90.0000 mg | Freq: Once | SUBCUTANEOUS | Status: AC
  Administered 2019-01-11: 90 mg via SUBCUTANEOUS
  Filled 2019-01-11: qty 1

## 2019-01-11 MED ORDER — SODIUM CHLORIDE 0.9 % IV SOLN
INTRAVENOUS | Status: DC
  Administered 2019-01-12: 06:00:00 via INTRAVENOUS

## 2019-01-11 MED ORDER — CEFAZOLIN SODIUM-DEXTROSE 2-4 GM/100ML-% IV SOLN
2.0000 g | INTRAVENOUS | Status: DC
  Filled 2019-01-11: qty 100

## 2019-01-11 MED ORDER — FUROSEMIDE 10 MG/ML IJ SOLN
40.0000 mg | Freq: Once | INTRAMUSCULAR | Status: AC
  Administered 2019-01-11: 40 mg via INTRAVENOUS
  Filled 2019-01-11: qty 4

## 2019-01-11 MED ORDER — WARFARIN SODIUM 2.5 MG PO TABS
2.5000 mg | ORAL_TABLET | Freq: Once | ORAL | Status: AC
  Administered 2019-01-11: 2.5 mg via ORAL
  Filled 2019-01-11: qty 1

## 2019-01-11 NOTE — H&P (View-Only) (Signed)
RN called, pt remains very sleepy ABG back and reviewed CO2 69.3 PO2 124 Updated Dr. Lovena Le reduce n/c O2 to 2L, anticipate this should improve mentation Goal O2 sats low 90's Repeat ABG in 3 hours on less O2  Tommye Standard, PA-C   ADDEND: Pt seen, OOB to chair, mentating much better, c/o sore back from the bed.  Reports feeling tired, but she is not lethargic  Tommye Standard, PA-C  EP Attending  Agree with above. Her mentation has improved with a reduction in her supplemental oxygen.  Mikle Bosworth.D.

## 2019-01-11 NOTE — Progress Notes (Signed)
ANTICOAGULATION CONSULT NOTE - Follow Up Consult  Pharmacy Consult for warfarin Indication: MechMVR  Patient Measurements: Height: 5\' 8"  (172.7 cm) Weight: 205 lb 4 oz (93.1 kg) IBW/kg (Calculated) : 63.9   Vital Signs: Temp: 97.6 F (36.4 C) (12/08 0450) Temp Source: Oral (12/08 0450) BP: 128/63 (12/08 0904) Pulse Rate: 80 (12/08 0904)  Labs: Recent Labs    01/09/19 0326 01/10/19 0238 01/11/19 0350 01/11/19 0645  HGB 9.8* 10.0* 9.5*  --   HCT 32.5* 32.2* 30.7*  --   PLT 123* 138* 156  --   LABPROT 26.3* 23.4* 22.0*  --   INR 2.4* 2.1* 1.9*  --   CREATININE  --   --   --  0.66    Estimated Creatinine Clearance: 84.8 mL/min (by C-G formula based on SCr of 0.66 mg/dL).   Assessment: 63 yo W on warfarin for hx mechanical MVR admitted for ICD removal 12/2 due to pocket infection. Last dose of warfarin was 11/27, then held for procedure. INR on admit was 1.5.    S/p ICD and lead removal and pocket debridement, temp pacer placed 12/2 - plan ICD replacement next week- Dr Lovena Le would like INR about 2 for procedure.    Lovenox stopped 12/5 and held yesterday d/t pocket hematoma. Hgb stable 9.5 this morning, plt low stable 130s. No bleeding noted.   Home Warfarin dose 1.25mg  Fri, 2.5 AOD  INR down to 1.9 today, spoke to [facility] with EP, would like INR just over 2.  Goal of Therapy:  INR 2.5 -3.5 (aim for INR 2 until ICD placed next week) Monitor platelets by anticoagulation protocol: Yes   Plan:  Warfarin 2.5 mg x 1 tonight. One dose of lovenox 1 mg/kg given this AM. Plan for CRT-P on 12/9  Marguerite Olea, [facility] Clinical Pharmacist Phone [phone]  01/11/2019 11:05 AM

## 2019-01-11 NOTE — Progress Notes (Signed)
Patient stating having increased shortness of breath and feeling suffocated. Upon assessment, left lung sounds more diminished than right. Patient on 2L 93% and having productive cough. Respiratory therapist at bedside giving breathing treatments. Paged Cardiology for possible repeat CXR. Got orders for CXR.   Paged Cardiology results of CXR, got orders for iv lasix. Will continue to monitor patient.

## 2019-01-11 NOTE — Progress Notes (Addendum)
Progress Note  Patient Name: Ana Martin Date of Encounter: 01/11/2019  Primary Cardiologist: Carlyle Dolly, MD   Subjective   C/w pain at lateral op site.  Dressing is clean and dry, epicardial wires remain, no CP, no SOB  Inpatient Medications    Scheduled Meds: . ALPRAZolam  0.5 mg Oral BID  . aspirin EC  81 mg Oral QHS  . atorvastatin  40 mg Oral QHS  . bisacodyl  10 mg Oral Daily  . Chlorhexidine Gluconate Cloth  6 each Topical Daily  . furosemide  60 mg Intravenous Daily  . insulin aspart  0-24 Units Subcutaneous TID AC & HS  . levalbuterol  0.63 mg Nebulization BID  . levothyroxine  200 mcg Oral Q0600  . losartan  25 mg Oral QHS  . metFORMIN  500 mg Oral Q breakfast  . metoprolol tartrate  5 mg Intravenous Q6H  . mometasone-formoterol  2 puff Inhalation BID  . senna-docusate  1 tablet Oral QHS  . venlafaxine XR  150 mg Oral QHS  . Vitamin D (Ergocalciferol)  50,000 Units Oral Q Mon  . warfarin  2.5 mg Oral ONCE-1800  . Warfarin - Pharmacist Dosing Inpatient   Does not apply q1800   Continuous Infusions: .  ceFAZolin (ANCEF) IV 1 g (01/11/19 0917)   PRN Meds: acetaminophen, albuterol, fluticasone, hydrALAZINE, Melatonin, morphine injection, ondansetron (ZOFRAN) IV, traMADol   Vital Signs    Vitals:   01/11/19 0630 01/11/19 0845 01/11/19 0848 01/11/19 0904  BP:    128/63  Pulse:    80  Resp:    16  Temp:      TempSrc:      SpO2:  97% 99% 98%  Weight: 93.1 kg     Height:        Intake/Output Summary (Last 24 hours) at 01/11/2019 0940 Last data filed at 01/11/2019 0504 Gross per 24 hour  Intake 611.33 ml  Output 1225 ml  Net -613.67 ml   Last 3 Weights 01/11/2019 01/10/2019 01/08/2019  Weight (lbs) 205 lb 4 oz 204 lb 12.9 oz 204 lb 2.3 oz  Weight (kg) 93.1 kg 92.9 kg 92.6 kg  Some encounter information is confidential and restricted. Go to Review Flowsheets activity to see all data.      Telemetry    V paced - Personally Reviewed  ECG     No new EKGs - Personally Reviewed  Physical Exam   GEN: No acute distress.   Neck: No JVD Cardiac: RRR, no murmurs, rubs, or gallops.  Respiratory: CTA b/l. GI: Soft, nontender, non-distended  MS: No edema; No deformity. Neuro:  Nonfocal, sleepy Psych: flat affect today  L neck, temp perm is in place, pressure dressing removed this AM, extraction site dressing is clean, dry, she has minimal scatterred ecchymosis L chest, neck R groin site stable, no bleeding or hematoma L thoracotomy dressing is clean and dry, epicardial wires present, she has eccymosis L thorax, lateral L breast, no heamtoma  Labs    High Sensitivity Troponin:  No results for input(s): TROPONINIHS in the last 720 hours.    Chemistry Recent Labs  Lab 01/05/19 0627  01/07/19 0233 01/08/19 0354 01/11/19 0645  NA 136   < > 135 136 137  K 4.2   < > 4.8 4.5 4.3  CL 96*   < > 99 99 94*  CO2 28   < > 28 29 33*  GLUCOSE 124*   < > 171* 105* 127*  BUN 12   < > 14 19 19   CREATININE 0.93   < > 0.75 0.89 0.66  CALCIUM 9.0   < > 8.9 8.5* 9.1  PROT 7.5  --  6.4*  --   --   ALBUMIN 3.9  --  3.4*  --   --   AST 26  --  19  --   --   ALT 24  --  17  --   --   ALKPHOS 57  --  48  --   --   BILITOT 0.6  --  0.6  --   --   GFRNONAA >60   < > >60 >60 >60  GFRAA >60   < > >60 >60 >60  ANIONGAP 12   < > 8 8 10    < > = values in this interval not displayed.     Hematology Recent Labs  Lab 01/09/19 0326 01/10/19 0238 01/11/19 0350  WBC 9.8 8.9 9.6  RBC 3.67* 3.68* 3.49*  HGB 9.8* 10.0* 9.5*  HCT 32.5* 32.2* 30.7*  MCV 88.6 87.5 88.0  MCH 26.7 27.2 27.2  MCHC 30.2 31.1 30.9  RDW 17.6* 17.4* 17.2*  PLT 123* 138* 156    BNPNo results for input(s): BNP, PROBNP in the last 168 hours.   DDimer No results for input(s): DDIMER in the last 168 hours.   Radiology    Dg Chest Port 1 View Result Date: 01/10/2019 CLINICAL DATA:  Left-sided chest pain. Recent pacemaker revision. EXAM: PORTABLE CHEST 1 VIEW  COMPARISON:  Radiographs 01/07/2019 and 01/05/2019. CT 01/03/2019. FINDINGS: 1144 hours. Right IJ central venous catheter tip is unchanged at the level of the upper SVC. Left IJ pacemaker lead projects over the right ventricle. There is stable mild cardiomegaly post median sternotomy and valve replacement. There is persistent vascular congestion with improved asymmetric pulmonary edema on the left. There is a probable improving left pleural effusion. No evidence of pneumothorax. Previous proximal left humeral ORIF. IMPRESSION: 1. Improving left pleural effusion and asymmetric pulmonary edema. No pneumothorax. 2. No change in position of the right IJ central venous catheter or pacemaker. Electronically Signed   By: Richardean Sale M.D.   On: 01/10/2019 12:01    Cardiac Studies   Unable to track down inter-op TEE report  Patient Profile     63 y.o. female AFib/atypical flutter, HTN, HLD, LBBB, VHD w/mechanical MVR, CHB (s/p AVNode ablation), chronic CHF (systolic) w/CRT-D (s/p gen change June 2020), smoker  Admitted to undergo ICD system extraction 2/2 pocket infection/erosion  Assessment & Plan    1. ICD pocket infection, skin erosion     01/05/2019     S/p device system extraction, trans-venous extraction of intravascular leads and lateral-thorcotomy with CTS for removal of epicardial wires as well     Temp-perm placement     POD #5 today  Dr. Caryl Comes noted this weekend a dip in her Hgb and development of site pocket hematoma over the weekend, place pressured dressing Hgb remains st able, pressure dressing removed, no hematoma appreciated, + ecchymosis  CT removed  01/07/2019  Dr. Lovena Le saw and examined pt this AM Discussed with the pt, improvement in her EF and possibly CRT-P vs ICD Will plan for tomorrow CRT-D (inter-op TEE with LVEF 40's verbally by Dr. Lovena Le, I am unable to find the report) Remains on Ancef IV q8  Plan to remove central line tomorrow  She seems sleepy this AM,  sleeping when I return after her visit  with Dr. Lovena Le earlier today She wakes easily, AAO x4, follows all commands without deficits noted She is not as animated as usual, mentions she is a little scared about tomorrow Labs look  OK  Denies any ongoing pain, will stop pain meds,  last dose morphine was yesterday afternoon, got Tramadol last night Has her home xanax and effexor (declined it last night though)   VSS, sats look good on n/c O2 Labs look ok Will get ABG   2. AFib (permanent) 3. Mechanical MVR     INR today 1.9, d/w Dr. Lovena Le, single dose of lovenox today, continue warfarin INR goal per-operatively low 2's D/w RPH   4. HTN     No changes today   Appreciate OT/PT The pt tells me she would not be agreeable to SNF/rehab, would be more open to the idea of Home health, but wants to go home at discharge        For questions or updates, please contact [place] Please consult [email or web address] for contact info under   Signed, Baldwin Jamaica, PA-C  01/11/2019, 9:40 AM    EP Attending  Patient seen and examined. Agree with the findings as noted above. The patient has been stable over night. She will undergo Biv ICD insertion later today. I have reviewed the indications/risks/benefits/goals/expectations and she wishes to proceed.  Mikle Bosworth.D.

## 2019-01-11 NOTE — Progress Notes (Signed)
Pharmacy Antibiotic Note  Ana Martin is a 63 y.o. female admitted on 01/05/2019 with pacer pocket infection.  Pharmacy has been consulted for cefazolin dosing.  Plan: Continue cefazolin 1g q 8 hrs. F/u LOT.  Height: 5\' 8"  (172.7 cm) Weight: 205 lb 4 oz (93.1 kg) IBW/kg (Calculated) : 63.9  Temp (24hrs), Avg:97.9 F (36.6 C), Min:97.6 F (36.4 C), Max:98.3 F (36.8 C)  Recent Labs  Lab 01/05/19 0627 01/06/19 0404 01/07/19 0233 01/08/19 0354 01/09/19 0326 01/10/19 0238 01/11/19 0350 01/11/19 0645  WBC 10.7* 16.5* 17.8* 10.9* 9.8 8.9 9.6  --   CREATININE 0.93 0.81 0.75 0.89  --   --   --  0.66    Estimated Creatinine Clearance: 84.8 mL/min (by C-G formula based on SCr of 0.66 mg/dL).    Allergies  Allergen Reactions  . Codeine Nausea And Vomiting    Stomach cramp    Antimicrobials this admission: Keflex PTA 11/24 > 12/3 Cefazolin 12/4 >   Dose adjustments this admission:  Microbiology results: No cultures available.  Thank you for allowing pharmacy to be a part of this patient's care.  Marguerite Olea, [facility] Clinical Pharmacist Phone [phone]  01/11/2019 11:25 AM

## 2019-01-11 NOTE — Progress Notes (Signed)
Physical Therapy Treatment Patient Details Name: Ana Martin MRN: [id number] DOB: 03/08/1955 Today's Date: 01/11/2019    History of Present Illness Patient is a 63 y/o female who presents with Infected pacemaker generator system now s/p  Left thoracotomy for replacement of epicardial leads 12/2. Pt with development of site pocket hematoma. Planning R sided BIV-ICD this week.  PMH includes A-fib, WPW syndrome, AICD, HTN, mechnical heart valve.    PT Comments    Pt in bed upon PT arrival, agreeable to PT session despite sig lethargy and pt feeling "sleepy". The pt initially only communicated with eyes closed and head nods, increased verbal responses when sitting EOB. Improved communication through session despite continued flat affect. The pt was able to demo improved independence with transfers (min guard only for safety) as well as sig improvement in amb distance (40 ft vs 8 ft) but continues to amb with slow and unsteady gait, with limited endurance. Pt will continue to benefit from skilled PT to maximize independence and safety prior to d/c home.     Follow Up Recommendations  Home health PT;Supervision for mobility/OOB     Equipment Recommendations  Rolling walker with 5" wheels    Recommendations for Other Services       Precautions / Restrictions Precautions Precautions: ICD/Pacemaker Restrictions Weight Bearing Restrictions: Yes RLE Weight Bearing: Weight bearing as tolerated    Mobility  Bed Mobility Overal bed mobility: Needs Assistance Bed Mobility: Supine to Sit     Supine to sit: Min guard     General bed mobility comments: Pt able to come to sitting EOB without assist. No dizziness. Stood from [employer], from [facility] x1, transferred to chair. Cues to adhere from using LUE to push.  Transfers Overall transfer level: Needs assistance Equipment used: 1 person hand held assist Transfers: Sit to/from Stand Sit to Stand: Min guard         General transfer  comment: pt able to use RUE (with sig VCs) to push to stand/control lower  Ambulation/Gait Ambulation/Gait assistance: Min assist;+2 safety/equipment Gait Distance (Feet): 40 Feet Assistive device: Rolling walker (2 wheeled) Gait Pattern/deviations: Step-through pattern;Shuffle;Decreased stride length Gait velocity: decreased Gait velocity interpretation: <1.31 ft/sec, indicative of household ambulator General Gait Details: slow amb with poor use of RW despite frequent cues and assist, able to dictate need for return to room due to [facility] Mobility    Modified Rankin (Stroke Patients Only)       Balance Overall balance assessment: Needs assistance Sitting-balance support: Feet supported;No upper extremity supported Sitting balance-Leahy Scale: Fair     Standing balance support: During functional activity;Bilateral upper extremity supported Standing balance-Leahy Scale: Poor Standing balance comment: requires external support and UE support.                            Cognition Arousal/Alertness: Lethargic Behavior During Therapy: WFL for tasks assessed/performed;Flat affect Overall Cognitive Status: No family/caregiver present to determine baseline cognitive functioning                                 General Comments: Pt initially difficult to arouse, nodding head to answer PT questions, then progressed to short verbal answers. Pt follows 1 step commands, but was unable to implement corrections regarding RW management or avoid LUE use for  functional movement      Exercises      General Comments General comments (skin integrity, edema, etc.): VSS throughout, on 3L O2      Pertinent Vitals/Pain Pain Assessment: No/denies pain    Home Living                      Prior Function            PT Goals (current goals can now be found in the care plan section) Acute Rehab PT Goals Patient Stated  Goal: to go home PT Goal Formulation: With patient Time For Goal Achievement: 01/24/19 Potential to Achieve Goals: Good Progress towards PT goals: Progressing toward goals    Frequency    Min 3X/week      PT Plan Current plan remains appropriate    Co-evaluation              AM-PAC PT "6 Clicks" Mobility   Outcome Measure  Help needed turning from your back to your side while in a flat bed without using bedrails?: A Little Help needed moving from lying on your back to sitting on the side of a flat bed without using bedrails?: A Little Help needed moving to and from a bed to a chair (including a wheelchair)?: A Little Help needed standing up from a chair using your arms (e.g., wheelchair or bedside chair)?: A Little Help needed to walk in hospital room?: A Little Help needed climbing 3-5 steps with a railing? : A Lot 6 Click Score: 17    End of Session Equipment Utilized During Treatment: Oxygen;Gait belt Activity Tolerance: Patient tolerated treatment well Patient left: in chair;with call bell/phone within reach Nurse Communication: Mobility status PT Visit Diagnosis: Muscle weakness (generalized) (M62.81);Difficulty in walking, not elsewhere classified (R26.2);Unsteadiness on feet (R26.81)     Time: [id number] PT Time Calculation (min) (ACUTE ONLY): 36 min  Charges:  $Gait Training: 8-22 mins $Therapeutic Activity: 8-22 mins                     Mickey Farber, PT, DPT   Acute Rehabilitation Department [phone]   Otho Bellows 01/11/2019, 11:14 AM

## 2019-01-11 NOTE — Progress Notes (Addendum)
RN called, pt remains very sleepy ABG back and reviewed CO2 69.3 PO2 124 Updated Dr. Lovena Le reduce n/c O2 to 2L, anticipate this should improve mentation Goal O2 sats low 90's Repeat ABG in 3 hours on less O2  Tommye Standard, PA-C   ADDEND: Pt seen, OOB to chair, mentating much better, c/o sore back from the bed.  Reports feeling tired, but she is not lethargic  Tommye Standard, PA-C  EP Attending  Agree with above. Her mentation has improved with a reduction in her supplemental oxygen.  Mikle Bosworth.D.

## 2019-01-11 NOTE — Plan of Care (Signed)

## 2019-01-11 NOTE — Care Management Important Message (Signed)
Important Message  Patient Details  Name: BETSELOT COWPER MRN: [id number] Date of Birth: 07/01/1955   Medicare Important Message Given:  Yes     [name] 01/11/2019, 1:00 PM

## 2019-01-12 ENCOUNTER — Inpatient Hospital Stay (HOSPITAL_COMMUNITY): Payer: PPO

## 2019-01-12 ENCOUNTER — Inpatient Hospital Stay (HOSPITAL_COMMUNITY)
Admission: RE | Disposition: A | Discharge status: Home Health | Payer: Self-pay | Source: Home / Self Care | Attending: Internal Medicine

## 2019-01-12 DIAGNOSIS — I5022 Chronic systolic (congestive) heart failure: Secondary | ICD-10-CM

## 2019-01-12 DIAGNOSIS — I428 Other cardiomyopathies: Secondary | ICD-10-CM

## 2019-01-12 HISTORY — PX: BIV PACEMAKER INSERTION CRT-P: EP1199

## 2019-01-12 LAB — CBC
HCT: 31.5 % — ABNORMAL LOW (ref 36.0–46.0)
Hemoglobin: 9.5 g/dL — ABNORMAL LOW (ref 12.0–15.0)
MCH: 27 pg (ref 26.0–34.0)
MCHC: 30.2 g/dL (ref 30.0–36.0)
MCV: 89.5 fL (ref 80.0–100.0)
Platelets: 181 10*3/uL (ref 150–400)
RBC: 3.52 MIL/uL — ABNORMAL LOW (ref 3.87–5.11)
RDW: 17.4 % — ABNORMAL HIGH (ref 11.5–15.5)
WBC: 9.7 10*3/uL (ref 4.0–10.5)
nRBC: 0 % (ref 0.0–0.2)

## 2019-01-12 LAB — GLUCOSE, CAPILLARY
Glucose-Capillary: 114 mg/dL — ABNORMAL HIGH (ref 70–99)
Glucose-Capillary: 98 mg/dL (ref 70–99)
Glucose-Capillary: 104 mg/dL — ABNORMAL HIGH (ref 70–99)
Glucose-Capillary: 100 mg/dL — ABNORMAL HIGH (ref 70–99)

## 2019-01-12 LAB — BASIC METABOLIC PANEL
Anion gap: 9 (ref 5–15)
BUN: 18 mg/dL (ref 8–23)
CO2: 36 mmol/L — ABNORMAL HIGH (ref 22–32)
Calcium: 9.1 mg/dL (ref 8.9–10.3)
Chloride: 94 mmol/L — ABNORMAL LOW (ref 98–111)
Creatinine, Ser: 0.76 mg/dL (ref 0.44–1.00)
GFR calc Af Amer: >60 mL/min (ref >60)
GFR calc non Af Amer: >60 mL/min (ref >60)
Glucose, Bld: 115 mg/dL — ABNORMAL HIGH (ref 70–99)
Potassium: 4 mmol/L (ref 3.5–5.1)
Sodium: 139 mmol/L (ref 135–145)

## 2019-01-12 LAB — POTASSIUM: Potassium: 4 mmol/L (ref 3.5–5.1)

## 2019-01-12 LAB — PROTIME-INR
INR: 2.1 — ABNORMAL HIGH (ref 0.8–1.2)
Prothrombin Time: 23.6 s — ABNORMAL HIGH (ref 11.4–15.2)

## 2019-01-12 SURGERY — BIV PACEMAKER INSERTION CRT-P

## 2019-01-12 MED ORDER — MIDAZOLAM HCL 5 MG/5ML IJ SOLN
INTRAMUSCULAR | Status: AC
  Filled 2019-01-12: qty 5

## 2019-01-12 MED ORDER — FUROSEMIDE 10 MG/ML IJ SOLN
INTRAMUSCULAR | Status: DC | PRN
  Administered 2019-01-12: 80 mg via INTRAVENOUS

## 2019-01-12 MED ORDER — METOPROLOL TARTRATE 5 MG/5ML IV SOLN
INTRAVENOUS | Status: AC
  Filled 2019-01-12: qty 5

## 2019-01-12 MED ORDER — LIDOCAINE HCL (PF) 1 % IJ SOLN
INTRAMUSCULAR | Status: AC
  Filled 2019-01-12: qty 60

## 2019-01-12 MED ORDER — WARFARIN SODIUM 2 MG PO TABS
2.0000 mg | ORAL_TABLET | Freq: Once | ORAL | Status: AC
  Administered 2019-01-12: 2 mg via ORAL

## 2019-01-12 MED ORDER — METOPROLOL TARTRATE 5 MG/5ML IV SOLN
INTRAVENOUS | Status: DC | PRN
  Administered 2019-01-12: 5 mg via INTRAVENOUS

## 2019-01-12 MED ORDER — IOHEXOL 350 MG/ML SOLN
INTRAVENOUS | Status: DC | PRN
  Administered 2019-01-12: 10 mL
  Administered 2019-01-12: 50 mL via INTRAVENOUS

## 2019-01-12 MED ORDER — FUROSEMIDE 10 MG/ML IJ SOLN
INTRAMUSCULAR | Status: AC
  Filled 2019-01-12: qty 4

## 2019-01-12 MED ORDER — CEFAZOLIN SODIUM-DEXTROSE 2-4 GM/100ML-% IV SOLN
INTRAVENOUS | Status: AC
  Filled 2019-01-12: qty 100

## 2019-01-12 MED ORDER — ONDANSETRON HCL 4 MG/2ML IJ SOLN: 4.0000 mg | Freq: Four times a day (QID) | INTRAMUSCULAR | Status: DC | PRN

## 2019-01-12 MED ORDER — GUAIFENESIN-DM 100-10 MG/5ML PO SYRP
5.0000 mL | ORAL_SOLUTION | ORAL | Status: DC | PRN
  Administered 2019-01-12: 5 mL via ORAL
  Filled 2019-01-12: qty 5

## 2019-01-12 MED ORDER — CEFAZOLIN SODIUM-DEXTROSE 1-4 GM/50ML-% IV SOLN: 1.0000 g | Freq: Four times a day (QID) | INTRAVENOUS | Status: DC

## 2019-01-12 MED ORDER — ACETAMINOPHEN 325 MG PO TABS
325.0000 mg | ORAL_TABLET | ORAL | Status: DC | PRN
  Administered 2019-01-13 – 2019-01-14 (×2): 650 mg via ORAL
  Filled 2019-01-12 (×2): qty 2

## 2019-01-12 MED ORDER — SODIUM CHLORIDE 0.9 % IV SOLN
INTRAVENOUS | Status: AC
  Filled 2019-01-12: qty 2

## 2019-01-12 MED ORDER — LIDOCAINE HCL (PF) 1 % IJ SOLN
INTRAMUSCULAR | Status: DC | PRN
  Administered 2019-01-12: 40 mL

## 2019-01-12 MED ORDER — MIDAZOLAM HCL 5 MG/5ML IJ SOLN
INTRAMUSCULAR | Status: DC | PRN
  Administered 2019-01-12 (×7): 1 mg via INTRAVENOUS

## 2019-01-12 MED ORDER — FENTANYL CITRATE (PF) 100 MCG/2ML IJ SOLN
INTRAMUSCULAR | Status: AC
  Filled 2019-01-12: qty 2

## 2019-01-12 MED ORDER — FENTANYL CITRATE (PF) 100 MCG/2ML IJ SOLN
INTRAMUSCULAR | Status: DC | PRN
  Administered 2019-01-12 (×7): 12.5 ug via INTRAVENOUS

## 2019-01-12 SURGICAL SUPPLY — 23 items
BALLN ATTAIN 80 (BALLOONS) ×2
BALLN ATTAIN 80CM 6215 (BALLOONS) ×1
BALLOON ATTAIN 80 (BALLOONS) ×1 IMPLANT
CABLE SURGICAL S-101-97-12 (CABLE) ×3 IMPLANT
CATH ATTAIN COM SURV 6250V-45S (CATHETERS) ×3 IMPLANT
CATH ATTAIN SEL SURV 6248V-130 (CATHETERS) ×3 IMPLANT
CATH HEX JOS 2-5-2 65CM 6F REP (CATHETERS) ×3 IMPLANT
CATH RIGHTSITE C315HIS02 (CATHETERS) ×3 IMPLANT
ICD CLARIA MRI DTMA1D1 (ICD Generator) ×3 IMPLANT
KIT ESSENTIALS PG (KITS) ×3 IMPLANT
LEAD CAPSURE NOVUS 5076-52CM (Lead) ×3 IMPLANT
LEAD SELECT SECURE 3830 383069 (Lead) ×1 IMPLANT
LEAD SPRINT QUAT SEC 6935-58CM (Lead) ×3 IMPLANT
PAD PRO RADIOLUCENT 2001M-C (PAD) ×3 IMPLANT
SELECT SECURE 3830 383069 (Lead) ×3 IMPLANT
SHEATH 7FR PRELUDE SNAP 13 (SHEATH) ×3 IMPLANT
SHEATH 9.5FR PRELUDE SNAP 13 (SHEATH) ×3 IMPLANT
SHEATH 9FR PRELUDE SNAP 13 (SHEATH) ×3 IMPLANT
SHEATH WORLEY 9FR 62CM (SHEATH) ×3 IMPLANT
SLITTER 6232ADJ (MISCELLANEOUS) ×3 IMPLANT
TRAY PACEMAKER INSERTION (PACKS) ×3 IMPLANT
WIRE ACUITY WHISPER EDS 4648 (WIRE) ×3 IMPLANT
WIRE HI TORQ VERSACORE-J 145CM (WIRE) ×3 IMPLANT

## 2019-01-12 NOTE — Interval H&P Note (Signed)
History and Physical Interval Note:  01/12/2019 2:00 PM  Ana Martin  has presented today for surgery, with the diagnosis of heart block.  The various methods of treatment have been discussed with the patient and family. After consideration of risks, benefits and other options for treatment, the patient has consented to  Procedure(s): BIV PACEMAKER INSERTION CRT-P (N/A) as a surgical intervention.  The patient's history has been reviewed, patient examined, no change in status, stable for surgery.  I have reviewed the patient's chart and labs.  Questions were answered to the patient's satisfaction.     [name]

## 2019-01-12 NOTE — H&P (View-Only) (Signed)
Progress Note  Patient Name: Ana Martin Date of Encounter: 01/12/2019  Primary Cardiologist: Carlyle Dolly, MD   Subjective   C/w pain at lateral op site.  Dressing is clean and dry, epicardial wires remain, no CP, no SOB  Inpatient Medications    Scheduled Meds: . ALPRAZolam  0.5 mg Oral BID  . aspirin EC  81 mg Oral QHS  . atorvastatin  40 mg Oral QHS  . bisacodyl  10 mg Oral Daily  . chlorhexidine  60 mL Topical Once  . Chlorhexidine Gluconate Cloth  6 each Topical Daily  . furosemide  60 mg Intravenous Daily  . gentamicin irrigation  80 mg Irrigation To SS-Surg  . insulin aspart  0-24 Units Subcutaneous TID AC & HS  . levalbuterol  0.63 mg Nebulization BID  . levothyroxine  200 mcg Oral Q0600  . losartan  25 mg Oral QHS  . metFORMIN  500 mg Oral Q breakfast  . metoprolol tartrate  5 mg Intravenous Q6H  . mometasone-formoterol  2 puff Inhalation BID  . senna-docusate  1 tablet Oral QHS  . venlafaxine XR  150 mg Oral QHS  . Vitamin D (Ergocalciferol)  50,000 Units Oral Q Mon  . Warfarin - Pharmacist Dosing Inpatient   Does not apply q1800   Continuous Infusions: . sodium chloride 50 mL/hr at 01/12/19 0606  .  ceFAZolin (ANCEF) IV 1 g (01/12/19 [id number])  .  ceFAZolin (ANCEF) IV     PRN Meds: acetaminophen, albuterol, fluticasone, hydrALAZINE, Melatonin, ondansetron (ZOFRAN) IV, traMADol   Vital Signs    Vitals:   01/11/19 1953 01/11/19 2036 01/12/19 0552 01/12/19 0555  BP:   (!) 159/77   Pulse:   80   Resp:   19   Temp: (!) 97.5 F (36.4 C)  97.8 F (36.6 C)   TempSrc: Oral  Oral   SpO2:  96% 90%   Weight:    88.7 kg  Height:        Intake/Output Summary (Last 24 hours) at 01/12/2019 0754 Last data filed at 01/12/2019 0724 Gross per 24 hour  Intake 180 ml  Output 1650 ml  Net -1470 ml   Last 3 Weights 01/12/2019 01/11/2019 01/10/2019  Weight (lbs) 195 lb 8 oz 205 lb 4 oz 204 lb 12.9 oz  Weight (kg) 88.678 kg 93.1 kg 92.9 kg  Some encounter  information is confidential and restricted. Go to Review Flowsheets activity to see all data.      Telemetry    V paced - Personally Reviewed  ECG    No new EKGs - Personally Reviewed  Physical Exam   GEN: No acute distress.   Neck: No JVD Cardiac: RRR, no murmurs, rubs, or gallops.  Respiratory: CTA b/l. GI: Soft, nontender, non-distended  MS: No edema; No deformity. Neuro:  Nonfocal, sleepy Psych: flat affect today  L neck, temp perm is in place, pressure dressing removed this AM, extraction site dressing is clean, dry, she has minimal scatterred ecchymosis L chest, neck R groin site stable, no bleeding or hematoma L thoracotomy dressing is clean and dry, epicardial wires present, she has eccymosis L thorax, lateral L breast, no heamtoma  Labs    High Sensitivity Troponin:  No results for input(s): TROPONINIHS in the last 720 hours.    Chemistry Recent Labs  Lab 01/07/19 0233 01/08/19 0354 01/11/19 0645 01/11/19 2242 01/12/19 0639  NA 135 136 137  --  139  K 4.8 4.5 4.3 4.0 4.0  CL 99 99 94*  --  94*  CO2 28 29 33*  --  36*  GLUCOSE 171* 105* 127*  --  115*  BUN 14 19 19   --  18  CREATININE 0.75 0.89 0.66  --  0.76  CALCIUM 8.9 8.5* 9.1  --  9.1  PROT 6.4*  --   --   --   --   ALBUMIN 3.4*  --   --   --   --   AST 19  --   --   --   --   ALT 17  --   --   --   --   ALKPHOS 48  --   --   --   --   BILITOT 0.6  --   --   --   --   GFRNONAA >60 >60 >60  --  >60  GFRAA >60 >60 >60  --  >60  ANIONGAP 8 8 10   --  9     Hematology Recent Labs  Lab 01/10/19 0238 01/11/19 0350 01/12/19 0639  WBC 8.9 9.6 9.7  RBC 3.68* 3.49* 3.52*  HGB 10.0* 9.5* 9.5*  HCT 32.2* 30.7* 31.5*  MCV 87.5 88.0 89.5  MCH 27.2 27.2 27.0  MCHC 31.1 30.9 30.2  RDW 17.4* 17.2* 17.4*  PLT 138* 156 181    BNPNo results for input(s): BNP, PROBNP in the last 168 hours.   DDimer No results for input(s): DDIMER in the last 168 hours.   Radiology    Dg Chest Port 1 View Result  Date: 01/10/2019 CLINICAL DATA:  Left-sided chest pain. Recent pacemaker revision. EXAM: PORTABLE CHEST 1 VIEW COMPARISON:  Radiographs 01/07/2019 and 01/05/2019. CT 01/03/2019. FINDINGS: 1144 hours. Right IJ central venous catheter tip is unchanged at the level of the upper SVC. Left IJ pacemaker lead projects over the right ventricle. There is stable mild cardiomegaly post median sternotomy and valve replacement. There is persistent vascular congestion with improved asymmetric pulmonary edema on the left. There is a probable improving left pleural effusion. No evidence of pneumothorax. Previous proximal left humeral ORIF. IMPRESSION: 1. Improving left pleural effusion and asymmetric pulmonary edema. No pneumothorax. 2. No change in position of the right IJ central venous catheter or pacemaker. Electronically Signed   By: Richardean Sale M.D.   On: 01/10/2019 12:01    Cardiac Studies   Unable to track down inter-op TEE report  Patient Profile     63 y.o. female AFib/atypical flutter, HTN, HLD, LBBB, VHD w/mechanical MVR, CHB (s/p AVNode ablation), chronic CHF (systolic) w/CRT-D (s/p gen change June 2020), smoker  Admitted to undergo ICD system extraction 2/2 pocket infection/erosion  Assessment & Plan    1. ICD pocket infection, skin erosion     01/05/2019     S/p device system extraction, trans-venous extraction of intravascular leads and lateral-thorcotomy with CTS for removal of epicardial wires as well     Temp-perm placement     POD #7 today  AAOx4, much more animated today Remains on 1L n/c O2  Planned for CRT-D implant (R side), removal temp-perm Take out IJ today once peripheral line in place  2. AFib (permanent) 3. Mechanical MVR     INR today 2.1, d/w Dr. Lovena Le, continue warfarin     INR goal per-operatively low 2's     D/w RPH   4. HTN     No changes today  5. COPD     Continue treatments 6. CHF  Increased pleural effusion on CXR last night     Had been  improving and gently fluid neg each day     Feels better this AM, remains diminished b/l     Continue IV lasix     Will KVO her pre-op fluids        Appreciate OT/PT The pt though not be agreeable to SNF/rehab, would be more open to the idea of Home health, but wants to go home at discharge Mental status remains much better this AM, hopefully will mobilize more this AM and tomorrow         For questions or updates, please contact [place] Please consult [email or web address] for contact info under   Signed, Baldwin Jamaica, PA-C  01/12/2019, 7:54 AM    EP Attending  Patient seen and examined. Agree with the findings as noted above. The patient is stable for insertion of a biv ICD today. I have discussed the indications/risks/benefits and she wishes to proceed.  Mikle Bosworth.D.

## 2019-01-12 NOTE — Progress Notes (Addendum)
Called pharmacy concerning administering coumadin after pt returned from PPM insertion. Santiago Glad in pharmacy stated to still administer coumadin at scheduled time as long as there are no signs of bleeding. Will continue to monitor.   Sharolyn Douglas, [place] gave verbal order to still administer coumadin if there are no signs of bleeding or complications.

## 2019-01-12 NOTE — Progress Notes (Signed)
Progress Note  Patient Name: Ana Martin Date of Encounter: 01/12/2019  Primary Cardiologist: Carlyle Dolly, MD   Subjective   C/w pain at lateral op site.  Dressing is clean and dry, epicardial wires remain, no CP, no SOB  Inpatient Medications    Scheduled Meds: . ALPRAZolam  0.5 mg Oral BID  . aspirin EC  81 mg Oral QHS  . atorvastatin  40 mg Oral QHS  . bisacodyl  10 mg Oral Daily  . chlorhexidine  60 mL Topical Once  . Chlorhexidine Gluconate Cloth  6 each Topical Daily  . furosemide  60 mg Intravenous Daily  . gentamicin irrigation  80 mg Irrigation To SS-Surg  . insulin aspart  0-24 Units Subcutaneous TID AC & HS  . levalbuterol  0.63 mg Nebulization BID  . levothyroxine  200 mcg Oral Q0600  . losartan  25 mg Oral QHS  . metFORMIN  500 mg Oral Q breakfast  . metoprolol tartrate  5 mg Intravenous Q6H  . mometasone-formoterol  2 puff Inhalation BID  . senna-docusate  1 tablet Oral QHS  . venlafaxine XR  150 mg Oral QHS  . Vitamin D (Ergocalciferol)  50,000 Units Oral Q Mon  . Warfarin - Pharmacist Dosing Inpatient   Does not apply q1800   Continuous Infusions: . sodium chloride 50 mL/hr at 01/12/19 0606  .  ceFAZolin (ANCEF) IV 1 g (01/12/19 [id number])  .  ceFAZolin (ANCEF) IV     PRN Meds: acetaminophen, albuterol, fluticasone, hydrALAZINE, Melatonin, ondansetron (ZOFRAN) IV, traMADol   Vital Signs    Vitals:   01/11/19 1953 01/11/19 2036 01/12/19 0552 01/12/19 0555  BP:   (!) 159/77   Pulse:   80   Resp:   19   Temp: (!) 97.5 F (36.4 C)  97.8 F (36.6 C)   TempSrc: Oral  Oral   SpO2:  96% 90%   Weight:    88.7 kg  Height:        Intake/Output Summary (Last 24 hours) at 01/12/2019 0754 Last data filed at 01/12/2019 0724 Gross per 24 hour  Intake 180 ml  Output 1650 ml  Net -1470 ml   Last 3 Weights 01/12/2019 01/11/2019 01/10/2019  Weight (lbs) 195 lb 8 oz 205 lb 4 oz 204 lb 12.9 oz  Weight (kg) 88.678 kg 93.1 kg 92.9 kg  Some encounter  information is confidential and restricted. Go to Review Flowsheets activity to see all data.      Telemetry    V paced - Personally Reviewed  ECG    No new EKGs - Personally Reviewed  Physical Exam   GEN: No acute distress.   Neck: No JVD Cardiac: RRR, no murmurs, rubs, or gallops.  Respiratory: CTA b/l. GI: Soft, nontender, non-distended  MS: No edema; No deformity. Neuro:  Nonfocal, sleepy Psych: flat affect today  L neck, temp perm is in place, pressure dressing removed this AM, extraction site dressing is clean, dry, she has minimal scatterred ecchymosis L chest, neck R groin site stable, no bleeding or hematoma L thoracotomy dressing is clean and dry, epicardial wires present, she has eccymosis L thorax, lateral L breast, no heamtoma  Labs    High Sensitivity Troponin:  No results for input(s): TROPONINIHS in the last 720 hours.    Chemistry Recent Labs  Lab 01/07/19 0233 01/08/19 0354 01/11/19 0645 01/11/19 2242 01/12/19 0639  NA 135 136 137  --  139  K 4.8 4.5 4.3 4.0 4.0  CL 99 99 94*  --  94*  CO2 28 29 33*  --  36*  GLUCOSE 171* 105* 127*  --  115*  BUN 14 19 19   --  18  CREATININE 0.75 0.89 0.66  --  0.76  CALCIUM 8.9 8.5* 9.1  --  9.1  PROT 6.4*  --   --   --   --   ALBUMIN 3.4*  --   --   --   --   AST 19  --   --   --   --   ALT 17  --   --   --   --   ALKPHOS 48  --   --   --   --   BILITOT 0.6  --   --   --   --   GFRNONAA >60 >60 >60  --  >60  GFRAA >60 >60 >60  --  >60  ANIONGAP 8 8 10   --  9     Hematology Recent Labs  Lab 01/10/19 0238 01/11/19 0350 01/12/19 0639  WBC 8.9 9.6 9.7  RBC 3.68* 3.49* 3.52*  HGB 10.0* 9.5* 9.5*  HCT 32.2* 30.7* 31.5*  MCV 87.5 88.0 89.5  MCH 27.2 27.2 27.0  MCHC 31.1 30.9 30.2  RDW 17.4* 17.2* 17.4*  PLT 138* 156 181    BNPNo results for input(s): BNP, PROBNP in the last 168 hours.   DDimer No results for input(s): DDIMER in the last 168 hours.   Radiology    Dg Chest Port 1 View Result  Date: 01/10/2019 CLINICAL DATA:  Left-sided chest pain. Recent pacemaker revision. EXAM: PORTABLE CHEST 1 VIEW COMPARISON:  Radiographs 01/07/2019 and 01/05/2019. CT 01/03/2019. FINDINGS: 1144 hours. Right IJ central venous catheter tip is unchanged at the level of the upper SVC. Left IJ pacemaker lead projects over the right ventricle. There is stable mild cardiomegaly post median sternotomy and valve replacement. There is persistent vascular congestion with improved asymmetric pulmonary edema on the left. There is a probable improving left pleural effusion. No evidence of pneumothorax. Previous proximal left humeral ORIF. IMPRESSION: 1. Improving left pleural effusion and asymmetric pulmonary edema. No pneumothorax. 2. No change in position of the right IJ central venous catheter or pacemaker. Electronically Signed   By: Richardean Sale M.D.   On: 01/10/2019 12:01    Cardiac Studies   Unable to track down inter-op TEE report  Patient Profile     63 y.o. female AFib/atypical flutter, HTN, HLD, LBBB, VHD w/mechanical MVR, CHB (s/p AVNode ablation), chronic CHF (systolic) w/CRT-D (s/p gen change June 2020), smoker  Admitted to undergo ICD system extraction 2/2 pocket infection/erosion  Assessment & Plan    1. ICD pocket infection, skin erosion     01/05/2019     S/p device system extraction, trans-venous extraction of intravascular leads and lateral-thorcotomy with CTS for removal of epicardial wires as well     Temp-perm placement     POD #7 today  AAOx4, much more animated today Remains on 1L n/c O2  Planned for CRT-D implant (R side), removal temp-perm Take out IJ today once peripheral line in place  2. AFib (permanent) 3. Mechanical MVR     INR today 2.1, d/w Dr. Lovena Le, continue warfarin     INR goal per-operatively low 2's     D/w RPH   4. HTN     No changes today  5. COPD     Continue treatments 6. CHF  Increased pleural effusion on CXR last night     Had been  improving and gently fluid neg each day     Feels better this AM, remains diminished b/l     Continue IV lasix     Will KVO her pre-op fluids        Appreciate OT/PT The pt though not be agreeable to SNF/rehab, would be more open to the idea of Home health, but wants to go home at discharge Mental status remains much better this AM, hopefully will mobilize more this AM and tomorrow         For questions or updates, please contact [place] Please consult [email or web address] for contact info under   Signed, Baldwin [place], PA-C  01/12/2019, 7:54 AM    EP Attending  Patient seen and examined. Agree with the findings as noted above. The patient is stable for insertion of a biv ICD today. I have discussed the indications/risks/benefits and she wishes to proceed.  Mikle Bosworth.D.

## 2019-01-12 NOTE — Progress Notes (Signed)
Right IJ removed with Bailey Mech, RN. Site clean, dry, and intact. Pt resting in bed  <30 degrees with no complaints.

## 2019-01-12 NOTE — TOC Initial Note (Signed)
Transition of Care [facility]) - Initial/Assessment Note    Patient Details  Name: Ana Martin MRN: [id number] Date of Birth: 1955-10-20  Transition of [facility]) CM/SW Contact:    Bethena Roys, RN Phone Number: 01/12/2019, 3:54 PM  Clinical Narrative: Pt presented for infected pacemaker -removal. Post BIV Pacemaker 01-13-19. PTA from home with significant other. She recently moved from [place] to [facility]. Currently lives in a mobile home. Patient gave CM verbal permission to contact Aaron Edelman significant other; however I could not contact him and unable to leave a voicemail. Patient in need for [facility] Services. Patient has DME oxygen via [employer]. CM will continue to follow for additional transition of care needs.                Expected Discharge Plan: [place] Barriers to Discharge: Continued Medical Work up(Plan for CRT-P on 12/9)   Patient Goals and CMS Choice Patient states their goals for this hospitalization and ongoing recovery are:: "to return home"      Expected Discharge Plan and Services Expected Discharge Plan: [place] In-house Referral: NA Discharge Planning Services: CM Consult Post Acute Care Choice: Home Health(tried to reach out to significant other.) Living arrangements for the past 2 months: Mobile Home                  HH Arranged: PT, OT     Prior Living Arrangements/Services Living arrangements for the past 2 months: Mobile Home Lives with:: Significant Other Patient language and need for interpreter reviewed:: Yes Do you feel safe going back to the place where you live?: Yes      Need for Family Participation in Patient Care: Yes (Comment) Care giver support system in place?: Yes (comment)   Criminal Activity/Legal Involvement Pertinent to Current Situation/Hospitalization: No - Comment as needed  Activities of Daily Living Home Assistive Devices/Equipment: CBG Meter, Eyeglasses, Dentures (specify  type) ADL Screening (condition at time of admission) Patient's cognitive ability adequate to safely complete daily activities?: Yes Is the patient deaf or have difficulty hearing?: No Does the patient have difficulty seeing, even when wearing glasses/contacts?: No Does the patient have difficulty concentrating, remembering, or making decisions?: No Patient able to express need for assistance with ADLs?: Yes Does the patient have difficulty dressing or bathing?: No Independently performs ADLs?: Yes (appropriate for developmental age) Does the patient have difficulty walking or climbing stairs?: No Weakness of Legs: None Weakness of Arms/Hands: None  Permission Sought/Granted Permission sought to share information with : Family Supports, Chartered certified accountant granted to share information with : Yes, Verbal Permission Granted  Share Information with NAME: Aaron Edelman- significant other [phone] to contact the significant other.)           Emotional Assessment Appearance:: Appears stated age Attitude/Demeanor/Rapport: Engaged Affect (typically observed): Appropriate Orientation: : Oriented to Self, Oriented to Place, Oriented to  Time, Oriented to Situation Alcohol / Substance Use: Not Applicable Psych Involvement: No (comment)  Admission diagnosis:  ICD SYSTEM INFECTION Patient Active Problem List   Diagnosis Date Noted  . ICD (implantable cardioverter-defibrillator) infection ([place]) 01/05/2019  . S/P thoracotomy 01/05/2019  . Chronic systolic heart failure ([place]) 12/28/2018  . MDD (major depressive disorder), recurrent severe, without psychosis ([place]) 09/04/2017  . Elevated INR 08/31/2017  . Syncope 08/31/2017  . Anticoagulant overdosage 08/31/2017  . Wolff-Parkinson-White syndrome 08/31/2017  . COPD (chronic obstructive pulmonary disease) ([place]) 08/31/2017  . Pleuritic chest pain 08/31/2017  .  Former smoker 08/31/2017  . Obesity (BMI 30.0-34.9)  07/16/2016  . Glaucoma screening 01/16/2016  . History of thyroid cancer 01/16/2016  . Pre-diabetes 01/16/2016  . History of Wolff-Parkinson-White (WPW) syndrome 08/20/2015  . CHF exacerbation ([place]) 04/28/2015  . Pulmonary HTN ([place]) 04/28/2015  . Recurrent major depressive disorder ([place]) 04/28/2015  . Postoperative hypothyroidism 02/20/2015  . Asthma 02/06/2015  . Elevated AST (SGOT) 02/06/2015  . Elevated ferritin level 02/06/2015  . Elevated liver enzymes 02/06/2015  . Mitral valve disease 02/06/2015  . Multiple lung nodules 02/06/2015  . OSA (obstructive sleep apnea) 02/06/2015  . Long term (current) use of anticoagulants 01/24/2015  . Acute blood loss anemia 12/29/2012  . Humerus fracture 12/23/2012  . S/P thyroidectomy 12/23/2012  . Seizure ([place]) 12/23/2012  . Anxiety   . Depression   . Hypertension   . Hyperlipidemia   . Thyroid disease   . Other abnormal glucose   . Implantable cardioverter-defibrillator (ICD) in situ   . Other left bundle branch block   . Other primary cardiomyopathies   . Hypersomnia, unspecified   . Lump or mass in breast   . Unspecified vitamin D deficiency   . Thyroid cancer [facility])    PCP:  Celene Squibb, MD Pharmacy:   [place], [place] [id number] [place] [id number] Phone: [phone] Fax: [phone]  [facility] [place], [place] - [place] AT [place] [address] [place] [address] Phone: [phone] Fax: [phone]     Social Determinants of Health (SDOH) Interventions    Readmission Risk Interventions No flowsheet data found.

## 2019-01-12 NOTE — Progress Notes (Signed)
ANTICOAGULATION CONSULT NOTE - Follow Up Consult  Pharmacy Consult for warfarin Indication: MechMVR  Patient Measurements: Height: 5\' 8"  (172.7 cm) Weight: 195 lb 8 oz (88.7 kg) IBW/kg (Calculated) : 63.9   Vital Signs: Temp: 97.8 F (36.6 C) (12/09 0552) Temp Source: Oral (12/09 0552) BP: 115/63 (12/09 1223) Pulse Rate: 81 (12/09 1223)  Labs: Recent Labs    01/10/19 0238 01/11/19 0350 01/11/19 0645 01/12/19 0558 01/12/19 0639  HGB 10.0* 9.5*  --   --  9.5*  HCT 32.2* 30.7*  --   --  31.5*  PLT 138* 156  --   --  181  LABPROT 23.4* 22.0*  --  23.6*  --   INR 2.1* 1.9*  --  2.1*  --   CREATININE  --   --  0.66  --  0.76    Estimated Creatinine Clearance: 82.8 mL/min (by C-G formula based on SCr of 0.76 mg/dL).   Assessment: 63 yo W on warfarin for hx mechanical MVR admitted for ICD removal 12/2 due to pocket infection. Last dose of warfarin was 11/27, then held for procedure. INR on admit was 1.5.    S/p ICD and lead removal and pocket debridement, temp pacer placed 12/2 - plan ICD replacement next week- Dr Lovena Le would like INR about 2 for procedure.   Lovenox stopped 12/5 and held 12/8 d/t pocket hematoma. Hgb stable 9.5 this morning, plt trending up to 181. No bleeding noted.   Home Warfarin dose 1.25mg  Fri, 2.5 AOD  INR down to 2.1 today, spoke to [facility] with EP, would like INR just over 2. Received 1 time dose of enoxaparin on 12/8.   Goal of Therapy:  INR 2.5 -3.5 (aim for INR 2 until ICD placed next week) Monitor platelets by anticoagulation protocol: Yes   Plan:  Warfarin 2 mg x 1 tonight. Plan for CRT-P on 12/9  Antonietta Jewel, PharmD, [place] Pharmacist  Phone: [phone]  Please check AMION for all [place] phone numbers After 10:00 PM, call [place] [phone]  01/12/2019 12:46 PM

## 2019-01-13 ENCOUNTER — Inpatient Hospital Stay (HOSPITAL_COMMUNITY): Payer: PPO

## 2019-01-13 LAB — BASIC METABOLIC PANEL
Anion gap: 12 (ref 5–15)
BUN: 16 mg/dL (ref 8–23)
CO2: 36 mmol/L — ABNORMAL HIGH (ref 22–32)
Calcium: 9 mg/dL (ref 8.9–10.3)
Chloride: 91 mmol/L — ABNORMAL LOW (ref 98–111)
Creatinine, Ser: 0.71 mg/dL (ref 0.44–1.00)
GFR calc Af Amer: >60 mL/min (ref >60)
GFR calc non Af Amer: >60 mL/min (ref >60)
Glucose, Bld: 102 mg/dL — ABNORMAL HIGH (ref 70–99)
Potassium: 3.8 mmol/L (ref 3.5–5.1)
Sodium: 139 mmol/L (ref 135–145)

## 2019-01-13 LAB — GLUCOSE, CAPILLARY
Glucose-Capillary: 93 mg/dL (ref 70–99)
Glucose-Capillary: 128 mg/dL — ABNORMAL HIGH (ref 70–99)
Glucose-Capillary: 94 mg/dL (ref 70–99)

## 2019-01-13 LAB — PROTIME-INR
INR: 2.7 — ABNORMAL HIGH (ref 0.8–1.2)
Prothrombin Time: 28.4 seconds — ABNORMAL HIGH (ref 11.4–15.2)

## 2019-01-13 MED ORDER — CARVEDILOL 12.5 MG PO TABS
12.5000 mg | ORAL_TABLET | Freq: Two times a day (BID) | ORAL | Status: DC
  Administered 2019-01-13 – 2019-01-14 (×2): 12.5 mg via ORAL
  Filled 2019-01-13 (×2): qty 1

## 2019-01-13 MED ORDER — FUROSEMIDE 10 MG/ML IJ SOLN
60.0000 mg | Freq: Two times a day (BID) | INTRAMUSCULAR | Status: DC
  Administered 2019-01-13 – 2019-01-14 (×2): 60 mg via INTRAVENOUS
  Filled 2019-01-13 (×2): qty 6

## 2019-01-13 MED ORDER — POTASSIUM CHLORIDE CRYS ER 20 MEQ PO TBCR
20.0000 meq | EXTENDED_RELEASE_TABLET | Freq: Once | ORAL | Status: AC
  Administered 2019-01-13: 20 meq via ORAL
  Filled 2019-01-13: qty 1

## 2019-01-13 MED ORDER — WARFARIN 1.25 MG HALF TABLET
1.2500 mg | ORAL_TABLET | Freq: Once | ORAL | Status: AC
  Administered 2019-01-13: 1.25 mg via ORAL
  Filled 2019-01-13: qty 1

## 2019-01-13 MED ORDER — POLYETHYLENE GLYCOL 3350 17 G PO PACK
17.0000 g | PACK | Freq: Every day | ORAL | Status: DC
  Administered 2019-01-13 – 2019-01-14 (×2): 17 g via ORAL
  Filled 2019-01-13 (×2): qty 1

## 2019-01-13 MED FILL — Cefazolin Sodium-Dextrose IV Solution 2 GM/100ML-4%: INTRAVENOUS | Qty: 100 | Status: AC

## 2019-01-13 NOTE — Progress Notes (Signed)
ANTICOAGULATION CONSULT NOTE - Follow Up Consult  Pharmacy Consult for warfarin Indication: MechMVR  Patient Measurements: Height: 5\' 8"  (172.7 cm) Weight: 194 lb 3.2 oz (88.1 kg) IBW/kg (Calculated) : 63.9   Vital Signs: Temp: 97.8 F (36.6 C) (12/10 0500) Temp Source: Oral (12/10 0500) BP: 147/66 (12/10 1015) Pulse Rate: 69 (12/10 1015)  Labs: Recent Labs    01/11/19 0350 01/11/19 0645 01/12/19 0558 01/12/19 0639 01/13/19 0455 01/13/19 0731  HGB 9.5*  --   --  9.5*  --   --   HCT 30.7*  --   --  31.5*  --   --   PLT 156  --   --  181  --   --   LABPROT 22.0*  --  23.6*  --  28.4*  --   INR 1.9*  --  2.1*  --  2.7*  --   CREATININE  --  0.66  --  0.76  --  0.71    Estimated Creatinine Clearance: 82.5 mL/min (by C-G formula based on SCr of 0.71 mg/dL).   Assessment: 63 yo W on warfarin for hx mechanical MVR admitted for ICD removal 12/2 due to pocket infection. Last dose of warfarin was 11/27, then held for procedure. INR on admit was 1.5.    S/p ICD and lead removal and pocket debridement, temp pacer placed 12/2 - plan ICD replacement next week- Dr Lovena Le would like INR about 2 for procedure.   Lovenox stopped 12/5 and held 12/8 d/t pocket hematoma. Received 1 time dose of enoxaparin on 12/8 however for low INR.   Home Warfarin dose 1.25mg  Fri, 2.5 AOD  INR went up to 2.7 today, s/p BIV CRT-D placement yesterday. CBC stable on last check. No s/sx of bleeding   Goal of Therapy:  INR 2.5 -3.5 Monitor platelets by anticoagulation protocol: Yes   Plan:  Warfarin 1.25 mg x 1 tonight to keep at lower end of goal given ICD placement yesterday  Monitor daily INR, CBC, and for s/sx of bleeding   Antonietta Jewel, PharmD, BCCCP Clinical Pharmacist  Phone: [phone]  Please check AMION for all [place] phone numbers After 10:00 PM, call [place] [phone]  01/13/2019 10:31 AM

## 2019-01-13 NOTE — Progress Notes (Signed)
Occupational Therapy Treatment Patient Details Name: Ana Martin MRN: [id number] DOB: [birth year] Today's Date: 01/13/2019    History of present illness Patient is a 63 y/o female who presents with Infected pacemaker generator system now s/p Left thoracotomy for replacement of epicardial leads 12/2. Pt with development of site pocket hematoma. S/p R sided BIV-ICD 12/10.  PMH includes A-fib, WPW syndrome, AICD, HTN, mechnical heart valve.   OT comments  Patient sitting upright in bed upon arrival, agreeable to get up to chair for breakfast. Patient supervision for bed mob with HOB elevated. Educate patient on pacer precautions for R UE and to be gentle/minimize pushing + pulling with L UE as well as patient still has stitches. Educate patient on how to don sling to R UE and how to use momentum for sit to stand to minimize strain in UE's. Patient min guard to transfer to recliner chair with O2 drop to 87% on 2L, verbal cues for pursed lip breathing after approximately 45 seconds increase to 91%.    Follow Up Recommendations  Home health OT;Supervision/Assistance - 24 hour    Equipment Recommendations  None recommended by OT       Precautions / Restrictions Precautions Precautions: ICD/Pacemaker Restrictions Weight Bearing Restrictions: No Other Position/Activity Restrictions: pacer precautions R UE       Mobility Bed Mobility Overal bed mobility: Needs Assistance Bed Mobility: Supine to Sit     Supine to sit: Supervision;HOB elevated        Transfers Overall transfer level: Needs assistance Equipment used: None Transfers: Sit to/from Stand Sit to Stand: Min guard         General transfer comment: mildly unsteady. min guard for safety    Balance Overall balance assessment: Needs assistance Sitting-balance support: Feet supported;No upper extremity supported Sitting balance-Leahy Scale: Good     Standing balance support: During functional activity;No upper  extremity supported Standing balance-Leahy Scale: Fair Standing balance comment: patient min guard for safety                           ADL either performed or assessed with clinical judgement   ADL Overall ADL's : Needs assistance/impaired Eating/Feeding: Independent;Sitting               Upper Body Dressing : Moderate assistance Upper Body Dressing Details (indicate cue type and reason): donning sling R UE      Toilet Transfer: Min guard;Ambulation Toilet Transfer Details (indicate cue type and reason): simulated with transfer to recliner; min guard for safety as patient is mildly unsteady and for management of leads/wiring         Functional mobility during ADLs: Min guard                 Cognition Arousal/Alertness: Awake/alert Behavior During Therapy: WFL for tasks assessed/performed Overall Cognitive Status: Within Functional Limits for tasks assessed                                                General Comments O2 drop to 87% on 2L with transfer to chair, verbal cues for pursed lip breathing to increase 91%    Pertinent Vitals/ Pain       Pain Assessment: No/denies pain     Prior Functioning/Environment  Frequency  Min 2X/week        Progress Toward Goals  OT Goals(current goals can now be found in the care plan section)  Progress towards OT goals: Progressing toward goals  Acute Rehab OT Goals Patient Stated Goal: to go home OT Goal Formulation: With patient Time For Goal Achievement: 01/24/19 Potential to Achieve Goals: Good ADL Goals Pt Will Perform Grooming: with modified independence;standing Pt Will Perform Upper Body Bathing: with modified independence;standing;sitting Pt Will Perform Lower Body Bathing: with modified independence;sit to/from stand Pt Will Perform Upper Body Dressing: with modified independence;sitting;standing Pt Will Perform Lower Body Dressing: with modified  independence;sit to/from stand Pt Will Transfer to Toilet: with modified independence;ambulating;bedside commode Pt Will Perform Toileting - Clothing Manipulation and hygiene: with modified independence;sit to/from stand Additional ADL Goal #1: Pt will be Mod I in and OOB for basic ADLs  Plan Discharge plan remains appropriate       AM-PAC OT "6 Clicks" Daily Activity     Outcome Measure   Help from another person eating meals?: None Help from another person taking care of personal grooming?: A Little Help from another person toileting, which includes using toliet, bedpan, or urinal?: A Little Help from another person bathing (including washing, rinsing, drying)?: A Little Help from another person to put on and taking off regular upper body clothing?: A Lot Help from another person to put on and taking off regular lower body clothing?: A Lot 6 Click Score: 17    End of Session Equipment Utilized During Treatment: Oxygen  OT Visit Diagnosis: Unsteadiness on feet (R26.81);Other abnormalities of gait and mobility (R26.89);Muscle weakness (generalized) (M62.81)   Activity Tolerance Patient tolerated treatment well   Patient Left in chair;with call bell/phone within reach   Nurse Communication Mobility status        Time: [id number] OT Time Calculation (min): 16 min  Charges: OT General Charges $OT Visit: 1 Visit OT Treatments $Self Care/Home Management : 8-22 mins  Shon Millet OT OT office: [place] 01/13/2019, 12:54 PM

## 2019-01-13 NOTE — Progress Notes (Addendum)
Progress Note  Patient Name: Ana Martin Date of Encounter: 01/13/2019  Primary Cardiologist: Ana Dolly, MD   Subjective   No CP, feels "OK", became winded and desaturated when taking her dressing off.  got very anxious with the removal of the dressing  Inpatient Medications    Scheduled Meds:  ALPRAZolam  0.5 mg Oral BID   aspirin EC  81 mg Oral QHS   atorvastatin  40 mg Oral QHS   bisacodyl  10 mg Oral Daily   carvedilol  12.5 mg Oral BID WC   furosemide  60 mg Intravenous BID   insulin aspart  0-24 Units Subcutaneous TID AC & HS   levalbuterol  0.63 mg Nebulization BID   levothyroxine  200 mcg Oral Q0600   losartan  25 mg Oral QHS   metFORMIN  500 mg Oral Q breakfast   mometasone-formoterol  2 puff Inhalation BID   polyethylene glycol  17 g Oral Daily   potassium chloride  20 mEq Oral Once   senna-docusate  1 tablet Oral QHS   venlafaxine XR  150 mg Oral QHS   Vitamin D (Ergocalciferol)  50,000 Units Oral Q Mon   warfarin  1.25 mg Oral ONCE-1800   Warfarin - Pharmacist Dosing Inpatient   Does not apply q1800   Continuous Infusions:   ceFAZolin (ANCEF) IV 1 g (01/13/19 0542)   PRN Meds: acetaminophen, albuterol, fluticasone, guaiFENesin-dextromethorphan, hydrALAZINE, Melatonin, ondansetron (ZOFRAN) IV, traMADol   Vital Signs    Vitals:   01/13/19 1015 01/13/19 1030 01/13/19 1047 01/13/19 1100  BP: (!) 147/66 (!) 146/67 (!) 148/52 (!) 148/65  Pulse: 69 70 70 69  Resp: 19 (!) 21 20 17   Temp:      TempSrc:      SpO2: 92% 95% 90% 93%  Weight:      Height:        Intake/Output Summary (Last 24 hours) at 01/13/2019 1152 Last data filed at 01/13/2019 1100 Gross per 24 hour  Intake 1178.89 ml  Output 1850 ml  Net -671.11 ml   Last 3 Weights 01/13/2019 01/12/2019 01/11/2019  Weight (lbs) 194 lb 3.2 oz 195 lb 8 oz 205 lb 4 oz  Weight (kg) 88.089 kg 88.678 kg 93.1 kg  Some encounter information is confidential and restricted. Go to Review  Flowsheets activity to see all data.      Telemetry    V paced - Personally Reviewed  ECG    V paced - Personally Reviewed  Physical Exam   GEN: No acute distress.   Neck: No JVD Cardiac: RRR, no murmurs, rubs, or gallops.  Respiratory: decreased at the bases, soft exp wheezes. GI: Soft, nontender, non-distended  MS: No edema; No deformity. Neuro:  Nonfocal, sleepy Psych: flat affect today  L neck, temp perm is in place, pressure dressing removed this AM, extraction site dressing is clean, dry, she has minimal scatterred ecchymosis L chest, neck R groin site stable, no bleeding or hematoma L thoracotomy dressing is clean and dry, epicardial wires present, she has eccymosis L thorax, lateral L breast, no heamtoma  Labs    High Sensitivity Troponin:  No results for input(s): TROPONINIHS in the last 720 hours.    Chemistry Recent Labs  Lab 01/07/19 0233 01/11/19 0645 01/11/19 2242 01/12/19 0639 01/13/19 0731  NA 135 137  --  139 139  K 4.8 4.3 4.0 4.0 3.8  CL 99 94*  --  94* 91*  CO2 28 33*  --  36* 36*  GLUCOSE 171* 127*  --  115* 102*  BUN 14 19  --  18 16  CREATININE 0.75 0.66  --  0.76 0.71  CALCIUM 8.9 9.1  --  9.1 9.0  PROT 6.4*  --   --   --   --   ALBUMIN 3.4*  --   --   --   --   AST 19  --   --   --   --   ALT 17  --   --   --   --   ALKPHOS 48  --   --   --   --   BILITOT 0.6  --   --   --   --   GFRNONAA >60 >60  --  >60 >60  GFRAA >60 >60  --  >60 >60  ANIONGAP 8 10  --  9 12     Hematology Recent Labs  Lab 01/10/19 0238 01/11/19 0350 01/12/19 0639  WBC 8.9 9.6 9.7  RBC 3.68* 3.49* 3.52*  HGB 10.0* 9.5* 9.5*  HCT 32.2* 30.7* 31.5*  MCV 87.5 88.0 89.5  MCH 27.2 27.2 27.0  MCHC 31.1 30.9 30.2  RDW 17.4* 17.2* 17.4*  PLT 138* 156 181    BNPNo results for input(s): BNP, PROBNP in the last 168 hours.   DDimer No results for input(s): DDIMER in the last 168 hours.   Radiology    Dg Chest Port 1 View Result Date: 01/10/2019 CLINICAL  DATA:  Left-sided chest pain. Recent pacemaker revision. EXAM: PORTABLE CHEST 1 VIEW COMPARISON:  Radiographs 01/07/2019 and 01/05/2019. CT 01/03/2019. FINDINGS: 1144 hours. Right IJ central venous catheter tip is unchanged at the level of the upper SVC. Left IJ pacemaker lead projects over the right ventricle. There is stable mild cardiomegaly post median sternotomy and valve replacement. There is persistent vascular congestion with improved asymmetric pulmonary edema on the left. There is a probable improving left pleural effusion. No evidence of pneumothorax. Previous proximal left humeral ORIF. IMPRESSION: 1. Improving left pleural effusion and asymmetric pulmonary edema. No pneumothorax. 2. No change in position of the right IJ central venous catheter or pacemaker. Electronically Signed   By: Ana Martin M.D.   On: 01/10/2019 12:01    Cardiac Studies   Unable to track down inter-op TEE report  Patient Profile     63 y.o. female AFib/atypical flutter, HTN, HLD, LBBB, VHD w/mechanical MVR, CHB (s/p AVNode ablation), chronic CHF (systolic) w/CRT-D (s/p gen change June 2020), smoker  Admitted to undergo ICD system extraction 2/2 pocket infection/erosion  Assessment & Plan    1. ICD pocket infection, skin erosion     01/05/2019     S/p device system extraction, trans-venous extraction of intravascular leads and lateral-thorcotomy with CTS for removal of epicardial wires as well     Temp-perm placement     POD #8 today  AAOx4, remains much more animated today Remains on 1-2L n/c O2  S/p CRT-P (R side) yesterday Site is stable Device check this AM with stable measurements CXR post procedure without ptx  Ana Pat, PA with CTS saw her today, thoracotomy site is clean and dry CT suture remains in place Wound care instructions are to keep clean dry, wash gently He is making f/u with them, asked her CT suture be removed when her extraction site sutures are taken out early next week   2.  AFib (permanent) 3. Mechanical MVR     INR today 2.7  INR goal per-operatively low 2's     D/w RPH   4. HTN     IV lopressor >> home coreg today  5. COPD     Continue treatments     Pt reports she is on home O2     Does not have a portable tank, case management aware she will need O2 to travel home with 6. CHF     Increased pleural effusion on CXR last night     Had been improving and gently fluid neg each day     She got extra lasix yesterday pre-op, fluid neg 1401 yesterday     Remains quite SOB with ambulation, desaturated to 70's with only a very short walk     C/w some orthopnea     Increase lasix to BID today     BMET and CXR in AM     D/w RN, not to over-oxygenate either, goal resting Sats 89-low 90's, 1-2 Ln/c     Pt has O2 at home   The patient declines rehab/SNF Case management on board, will plan for home health PT/RN       Hopefully home tomorrow continue to mobilize today Miralax for constipation      For questions or updates, please contact [facility] Please consult [email or web address] for contact info under   Signed, Baldwin Jamaica, PA-C  01/13/2019, 11:52 AM    EP Attending  Patient seen and examined. Agree with above. She looks and feels better but continues to desaturate with exertion. She will be given additional lasix and we will try to ambulate. She is pacing appropriately. ICD interrogation under my direction demonstrates normal device function.   Mikle Bosworth.D.

## 2019-01-13 NOTE — Progress Notes (Signed)
SATURATION QUALIFICATIONS: (This note is used to comply with regulatory documentation for home oxygen)  Patient Saturations on Room Air at Rest = Not tested due to oxygen saturations 88-90% on 2L  Patient Saturations on 2 liters while Ambulating = 79%  Patient Saturations on 4Liters of oxygen while Ambulating = 91%  Please briefly explain why patient needs home oxygen:  Patient's sats dropped to 79% and patient required a sitting rest break while on 2 liters of oxygen. Patient required 4 liters of oxygen to return to room.

## 2019-01-13 NOTE — Progress Notes (Signed)
Patient w/ epicardial pacer wires in place to left side of chest. Per Tommye Standard PA, epicardial wires need to be removed. Anderson Malta RN from 4E came to bedside and removed pacer wires at this time. Patient currently resting in bed; all vital signs stable.

## 2019-01-13 NOTE — Progress Notes (Signed)
PT Cancellation Note  Patient Details Name: Ana Martin MRN: [id number] DOB: 21-Feb-1955   Cancelled Treatment:    Reason Eval/Treat Not Completed: Patient declined, no reason specified. Attempted to see patient 2x this morning, refused both times reporting she had just been up prior to my arrival. Confirmed with RN that patient had just ambulated out in hallway with her. Will re-attempt tomorrow.       Alianis Trimmer 01/13/2019, 12:08 PM

## 2019-01-14 LAB — BASIC METABOLIC PANEL
Anion gap: 9 (ref 5–15)
BUN: 16 mg/dL (ref 8–23)
CO2: 39 mmol/L — ABNORMAL HIGH (ref 22–32)
Calcium: 8.9 mg/dL (ref 8.9–10.3)
Chloride: 92 mmol/L — ABNORMAL LOW (ref 98–111)
Creatinine, Ser: 0.7 mg/dL (ref 0.44–1.00)
GFR calc Af Amer: >60 mL/min (ref >60)
GFR calc non Af Amer: >60 mL/min (ref >60)
Glucose, Bld: 103 mg/dL — ABNORMAL HIGH (ref 70–99)
Potassium: 3.8 mmol/L (ref 3.5–5.1)
Sodium: 140 mmol/L (ref 135–145)

## 2019-01-14 LAB — PROTIME-INR
INR: 2.7 — ABNORMAL HIGH (ref 0.8–1.2)
Prothrombin Time: 28.6 seconds — ABNORMAL HIGH (ref 11.4–15.2)

## 2019-01-14 LAB — GLUCOSE, CAPILLARY: Glucose-Capillary: 184 mg/dL — ABNORMAL HIGH (ref 70–99)

## 2019-01-14 MED ORDER — SODIUM CHLORIDE 0.9 % IV SOLN
INTRAVENOUS | Status: DC | PRN
  Administered 2019-01-14: 500 mL via INTRAVENOUS

## 2019-01-14 NOTE — Progress Notes (Signed)
Physical Therapy Treatment Patient Details Name: Ana Martin MRN: [id number] DOB: Nov 03, 1955 Today's Date: 01/14/2019    History of Present Illness Patient is a 63 y/o female who presents with Infected pacemaker generator system now s/p Left thoracotomy for replacement of epicardial leads 12/2. Pt with development of site pocket hematoma. S/p R sided BIV-ICD 12/10.  PMH includes A-fib, WPW syndrome, AICD, HTN, mechnical heart valve.    PT Comments    Patient received sitting at EOB requesting to use BSC; able to transfer to and from commode with S and assist for line management but is mildly impulsive, not waiting for PT to get sandals or socks on her feet prior to transfers/not follow cues for safety in this respect. She politely refuses further gait today "I already walked with nursing and they wore me out!", unable to convince her to participate further. Education provided on pacemaker precautions, she gives verbal agreement but continues to show poor functional adherence. She was left sitting at EOB with all needs met, bed alarm active.     Follow Up Recommendations  Home health PT;Supervision for mobility/OOB     Equipment Recommendations  Rolling walker with 5" wheels    Recommendations for Other Services       Precautions / Restrictions Precautions Precautions: ICD/Pacemaker Restrictions Weight Bearing Restrictions: No RLE Weight Bearing: Weight bearing as tolerated Other Position/Activity Restrictions: pacer precautions R UE    Mobility  Bed Mobility               General bed mobility comments: sitting at EOB upon entry  Transfers Overall transfer level: Needs assistance Equipment used: None Transfers: Sit to/from Stand;Stand Pivot Transfers Sit to Stand: Supervision Stand pivot transfers: Supervision       General transfer comment: S for all transfers for safety and line management  Ambulation/Gait             General Gait Details:  refused   Stairs             Wheelchair Mobility    Modified Rankin (Stroke Patients Only)       Balance Overall balance assessment: Needs assistance Sitting-balance support: Feet supported;No upper extremity supported Sitting balance-Leahy Scale: Good     Standing balance support: During functional activity;No upper extremity supported Standing balance-Leahy Scale: Fair Standing balance comment: S for safety                            Cognition Arousal/Alertness: Awake/alert Behavior During Therapy: Flat affect Overall Cognitive Status: Within Functional Limits for tasks assessed                                 General Comments: follows commands but does not functionally follow pacemaker precautions      Exercises      General Comments General comments (skin integrity, edema, etc.): VSS on 2LPM with transfers      Pertinent Vitals/Pain Pain Assessment: No/denies pain    Home Living                      Prior Function            PT Goals (current goals can now be found in the care plan section) Acute Rehab PT Goals Patient Stated Goal: to go home PT Goal Formulation: With patient Time For Goal Achievement: 01/24/19 Potential to Achieve  Goals: Good Progress towards PT goals: Progressing toward goals    Frequency    Min 3X/week      PT Plan Current plan remains appropriate    Co-evaluation              AM-PAC PT "6 Clicks" Mobility   Outcome Measure  Help needed turning from your back to your side while in a flat bed without using bedrails?: A Little Help needed moving from lying on your back to sitting on the side of a flat bed without using bedrails?: A Little Help needed moving to and from a bed to a chair (including a wheelchair)?: A Little Help needed standing up from a chair using your arms (e.g., wheelchair or bedside chair)?: A Little Help needed to walk in hospital room?: A Little Help  needed climbing 3-5 steps with a railing? : A Lot 6 Click Score: 17    End of Session Equipment Utilized During Treatment: Oxygen Activity Tolerance: Patient tolerated treatment well Patient left: in bed;with call bell/phone within reach;with bed alarm set   PT Visit Diagnosis: Muscle weakness (generalized) (M62.81);Difficulty in walking, not elsewhere classified (R26.2);Unsteadiness on feet (R26.81)     Time: 1230-1240 PT Time Calculation (min) (ACUTE ONLY): 10 min  Charges:  $Therapeutic Activity: 8-22 mins                     Windell Norfolk, DPT, PN1   Supplemental Physical Therapist [place]    Pager [phone] Acute Rehab Office [phone]

## 2019-01-14 NOTE — Progress Notes (Addendum)
Progress Note  Patient Name: Ana Martin Date of Encounter: 01/14/2019  Primary Cardiologist: Carlyle Dolly, MD   Subjective   No CP, feels "OK", had a BM yesterday finally.  Wants to go home, but feels like she needs O2, mentions she has been using her Mom's O2 periodically at home.  Inpatient Medications    Scheduled Meds:  ALPRAZolam  0.5 mg Oral BID   aspirin EC  81 mg Oral QHS   atorvastatin  40 mg Oral QHS   bisacodyl  10 mg Oral Daily   carvedilol  12.5 mg Oral BID WC   furosemide  60 mg Intravenous BID   insulin aspart  0-24 Units Subcutaneous TID AC & HS   levalbuterol  0.63 mg Nebulization BID   levothyroxine  200 mcg Oral Q0600   losartan  25 mg Oral QHS   metFORMIN  500 mg Oral Q breakfast   mometasone-formoterol  2 puff Inhalation BID   polyethylene glycol  17 g Oral Daily   senna-docusate  1 tablet Oral QHS   venlafaxine XR  150 mg Oral QHS   Vitamin D (Ergocalciferol)  50,000 Units Oral Q Mon   Warfarin - Pharmacist Dosing Inpatient   Does not apply q1800   Continuous Infusions:  sodium chloride 500 mL (01/14/19 0622)    ceFAZolin (ANCEF) IV 1 g (01/14/19 0622)   PRN Meds: sodium chloride, acetaminophen, albuterol, fluticasone, guaiFENesin-dextromethorphan, hydrALAZINE, Melatonin, ondansetron (ZOFRAN) IV, traMADol   Vital Signs    Vitals:   01/14/19 0131 01/14/19 0330 01/14/19 0333 01/14/19 0926  BP:  123/64  134/65  Pulse: 70 70  70  Resp: 12 19    Temp:  (!) 97.5 F (36.4 C)    TempSrc:  Oral    SpO2: 100% 99%    Weight:   88.2 kg   Height:        Intake/Output Summary (Last 24 hours) at 01/14/2019 1008 Last data filed at 01/14/2019 0300 Gross per 24 hour  Intake 240 ml  Output 1125 ml  Net -885 ml   Last 3 Weights 01/14/2019 01/13/2019 01/12/2019  Weight (lbs) 194 lb 8 oz 194 lb 3.2 oz 195 lb 8 oz  Weight (kg) 88.225 kg 88.089 kg 88.678 kg  Some encounter information is confidential and restricted. Go to Review Flowsheets  activity to see all data.      Telemetry    V paced - Personally Reviewed  ECG    No new EKGs - Personally Reviewed  Physical Exam   GEN: No acute distress.   Neck: No JVD Cardiac: RRR, no murmurs, rubs, or gallops.  Respiratory: no wheezing today, better air movement, diminished at bases, no rales or ronchi GI: Soft, nontender, non-distended  MS: No edema; No deformity. Neuro:  Nonfocal, sleepy Psych: flat affect today  R implant site: steri strips in place, some ecchymosis, no hematoma L chest extraction site: sutures in place, hematoma is stable, large area of ecchymosis, no bleeding L thoracotomy site is clean and dry, chest tube site suture remains, site is also clean and dry  Labs    High Sensitivity Troponin:  No results for input(s): TROPONINIHS in the last 720 hours.    Chemistry Recent Labs  Lab 01/12/19 0639 01/13/19 0731 01/14/19 0140  NA 139 139 140  K 4.0 3.8 3.8  CL 94* 91* 92*  CO2 36* 36* 39*  GLUCOSE 115* 102* 103*  BUN 18 16 16   CREATININE 0.76 0.71 0.70  CALCIUM 9.1 9.0 8.9  GFRNONAA >60 >60 >60  GFRAA >60 >60 >60  ANIONGAP 9 12 9      Hematology Recent Labs  Lab 01/10/19 0238 01/11/19 0350 01/12/19 0639  WBC 8.9 9.6 9.7  RBC 3.68* 3.49* 3.52*  HGB 10.0* 9.5* 9.5*  HCT 32.2* 30.7* 31.5*  MCV 87.5 88.0 89.5  MCH 27.2 27.2 27.0  MCHC 31.1 30.9 30.2  RDW 17.4* 17.2* 17.4*  PLT 138* 156 181    BNPNo results for input(s): BNP, PROBNP in the last 168 hours.   DDimer No results for input(s): DDIMER in the last 168 hours.   Radiology    Dg Chest Port 1 View Result Date: 01/10/2019 CLINICAL DATA:  Left-sided chest pain. Recent pacemaker revision. EXAM: PORTABLE CHEST 1 VIEW COMPARISON:  Radiographs 01/07/2019 and 01/05/2019. CT 01/03/2019. FINDINGS: 1144 hours. Right IJ central venous catheter tip is unchanged at the level of the upper SVC. Left IJ pacemaker lead projects over the right ventricle. There is stable mild cardiomegaly  post median sternotomy and valve replacement. There is persistent vascular congestion with improved asymmetric pulmonary edema on the left. There is a probable improving left pleural effusion. No evidence of pneumothorax. Previous proximal left humeral ORIF. IMPRESSION: 1. Improving left pleural effusion and asymmetric pulmonary edema. No pneumothorax. 2. No change in position of the right IJ central venous catheter or pacemaker. Electronically Signed   By: Richardean Sale M.D.   On: 01/10/2019 12:01    Cardiac Studies   Unable to track down inter-op TEE report (EF reportedly 40%)  Patient Profile     63 y.o. female AFib/atypical flutter, HTN, HLD, LBBB, VHD w/mechanical MVR, CHB (s/p AVNode ablation), chronic CHF (systolic) w/CRT-D (s/p gen change June 2020), smoker  Admitted to undergo ICD system extraction 2/2 pocket infection/erosion  Assessment & Plan    1. ICD pocket infection, skin erosion     01/05/2019     S/p device system extraction, trans-venous extraction of intravascular leads and lateral-thorcotomy with CTS for removal of epicardial wires as well     Temp-perm placement     POD #9 today  AAOx4, remains much more animated today Remains on 1-2L n/c O2  S/p CRT-P (R side) POD #2 Site is stable Device check this AM with stable measurements CXR post procedure without ptx  Evonnie Pat, PA with CTS saw her yesterday, thoracotomy site is clean and dry CT suture remains in place Wound care instructions are to keep clean dry, wash gently asked her CT suture be removed when her extraction site sutures are taken out early next week F/u with CTS is in place   2. AFib (permanent) 3. Mechanical MVR     INR today 2.7     INR goal per-operatively low 2's     D/w RPH  D/w Dr. Lovena Le, no coumadin tonight, resume usual home dosing tomorrow Will get INR at our visit Monday   4. HTN     No changes  5. COPD     Continue treatments The patient tells me she has been told for years  she has COPD, though does not think she has ever seen a pulmonologist formally out patient.  She has her Mom's O2 at home that she will use intermittently, but has never been prescribed O2 but has felt for quite a while like she needed it.      6. CHF     CXR yesterday without ptx, c/w mild edema on a background of atelectatic  Change.     IV lasix increased yesterday  7. Constipation     Had BM yesterday       Dr. Lovena Le has seen and examined the patient this AM, the patient would like to go home and feels she is ready to go, will need home O2,  Suspect her COPD worse then previously felt to be   The patient declines rehab/SNF but agreeable to [facility] visits Case management on board, will plan for home health PT/RN and O2       For questions or updates, please contact [place] Please consult [email or web address] for contact info under   Signed, Baldwin Jamaica, PA-C  01/14/2019, 10:08 AM    EP Attending  Patient seen and examined. Agree with the findings as noted above. The patient is doing well after insertion of a biv ICD on the right side. She gets stronger every day. She will be discharged home with outpatient assistance Including oxygen by nasal cannula.   Mikle Bosworth.D.

## 2019-01-14 NOTE — Progress Notes (Addendum)
ANTICOAGULATION CONSULT NOTE - Follow Up Consult  Pharmacy Consult for warfarin Indication: MechMVR  Patient Measurements: Height: 5\' 8"  (172.7 cm) Weight: 194 lb 8 oz (88.2 kg) IBW/kg (Calculated) : 63.9   Vital Signs: Temp: 97.5 F (36.4 C) (12/11 0330) Temp Source: Oral (12/11 0330) BP: 123/64 (12/11 0330) Pulse Rate: 70 (12/11 0330)  Labs: Recent Labs    01/12/19 0558 01/12/19 0639 01/13/19 0455 01/13/19 0731 01/14/19 0140  HGB  --  9.5*  --   --   --   HCT  --  31.5*  --   --   --   PLT  --  181  --   --   --   LABPROT 23.6*  --  28.4*  --  28.6*  INR 2.1*  --  2.7*  --  2.7*  CREATININE  --  0.76  --  0.71 0.70    Estimated Creatinine Clearance: 82.5 mL/min (by C-G formula based on SCr of 0.7 mg/dL).   Assessment: 63 yo W on warfarin for hx mechanical MVR admitted for ICD removal 12/2 due to pocket infection. Last dose of warfarin was 11/27, then held for procedure. INR on admit was 1.5.    S/p ICD and lead removal and pocket debridement, temp pacer placed 12/2 - plan ICD replacement next week- Dr Lovena Le would like INR about 2 for procedure.   Lovenox stopped 12/5 and held 12/8 d/t pocket hematoma. Received 1 time dose of enoxaparin on 12/8 however for low INR.   Home Warfarin dose 1.25mg  Fri, 2.5 AOD  INR remains at 2.7 today, s/p BIV CRT-D placement 12/10. CBC stable on last check. No s/sx of bleeding.   Goal of Therapy:  INR 2.5 -3.5 Monitor platelets by anticoagulation protocol: Yes   Plan:  Given recent ICD placement, plan to hold warfarin tonight after discussion with EP to keep INR closer to low 2s  Would resume home regimen on 12/12 of 2.5 mg daily except 1.25 mg on Friday   Monitor daily INR, CBC, and for s/sx of bleeding   Plan to continue cefazolin 1 g IV every 8 hours until time of discharge (no dosing change to regimen at this time)  Antonietta Jewel, PharmD, BCCCP Clinical Pharmacist  Phone: [phone]  Please check AMION for all [place] phone numbers After 10:00 PM, call Home [phone]  01/14/2019 8:24 AM

## 2019-01-14 NOTE — Discharge Instructions (Signed)
YOU ARE BEING DISCHARGED ON OXYGEN.  Dr. Lovena Le recommends using 1-2 liters at rest and 2-3L with exertion.   YOU WLL NEED TO SEE YOUR PRIMARY DOCTOR FOR MANAGEMENT AND OXYGEN ORDERS GOING FORWARD AS WE DISCUSSED       Supplemental Discharge Instructions for  Pacemaker/Defibrillator Patients  Activity No heavy lifting or vigorous activity with your right arm for 6 to 8 weeks.  Do not raise your right arm above your head for one week.  Gradually raise your affected arm as drawn below.              01/17/2019              01/18/2019             01/19/2019             01/20/2019 __  NO DRIVING for  1 week   ; you may begin driving on  [id number]  .  WOUND CARE - Keep your all wound areas clean and dry.  Do not shower until cleared to. - You have a wound check visit Monday for suture removal - The tape/steri-strips on your Right side wound will fall off; do not pull them off.  No other bandage is needed on the site.  DO  NOT apply any creams, oils, or ointments to ANY of your wound areas. - If you notice any drainage or discharge from the wounds, any swelling or bruising at the site, or you develop a fever > 101? F after you are discharged home, call the office at once.  Special Instructions - You are still able to use cellular telephones; use the ear opposite the side where you have your pacemaker/defibrillator.  Avoid carrying your cellular phone near your device. - When traveling through airports, show security personnel your identification card to avoid being screened in the metal detectors.  Ask the security personnel to use the hand wand. - Avoid arc welding equipment, MRI testing (magnetic resonance imaging), TENS units (transcutaneous nerve stimulators).  Call the office for questions about other devices. - Avoid electrical appliances that are in poor condition or are not properly grounded. - Microwave ovens are safe to be near or to operate.  Additional information for  defibrillator patients should your device go off: - If your device goes off ONCE and you feel fine afterward, notify the device clinic nurses. - If your device goes off ONCE and you do not feel well afterward, call 911. - If your device goes off TWICE, call 911. - If your device goes off THREE times in one day, call 911.  DO NOT DRIVE YOURSELF OR A FAMILY MEMBER WITH A DEFIBRILLATOR TO THE HOSPITAL--CALL 911.

## 2019-01-14 NOTE — Progress Notes (Signed)
SATURATION QUALIFICATIONS: (This note is used to comply with regulatory documentation for home oxygen)  Patient Saturations on Room Air at Rest =  82 %  Patient Saturations on Room Air while Ambulating =  79 %  Patient Saturations on  2  Liters of oxygen while Ambulating =   93 %  Please briefly explain why patient needs home oxygen:

## 2019-01-14 NOTE — TOC Transition Note (Signed)
Transition of Care [facility]) - CM/SW Discharge Note   Patient Details  Name: Ana Martin MRN: [id number] Date of Birth: 08/12/1955  [facility] CM/SW Contact:  Bethena Roys, RN Phone Number: 01/14/2019, 2:08 PM   Clinical Narrative:   Plan is for patient to transition home. Patient has Durable medical equipment oxygen via [employer]. [place]- contacted and portable tank will be delivered to the room prior to transition home. Choice provided for Home health services and [name] unable to staff in [place]. CM did call [place] and they will be able to staff patient for Physical Therapy only. Start of Care to begin within 24-48 hours of transition of home. Significant other to provide transportation home. No further needs from Case Manager at this time.    Final next level of care: [place] Barriers to Discharge: Continued Medical Work up(Plan for CRT-P on 12/9)   Patient Goals and CMS Choice Patient states their goals for this hospitalization and ongoing recovery are:: "to return home" CMS Medicare.gov Compare Post Acute Care list provided to:: Patient Represenative (must comment)(Significant other Aaron Edelman- over the phone.) Choice offered to / list presented to : (Significant other)   Discharge Plan and Services In-house Referral: NA Discharge Planning Services: CM Consult Post Acute Care Choice: Home Health(tried to reach out to significant other.)          DME Arranged: Oxygen(Patient should have a concentrator and portable fill tanks in the home. Recently moved call placed to [place] and Liaison to discuss situation with patient.) DME Agency: [place] Date DME Agency Contacted: 01/14/19 Time DME Agency Contacted: 1227 Representative spoke with at DME Agency: Jeneen Rinks HH Arranged: RN, Disease Management, PT, OT [place] Agency: [place] ([place]) Date [place]: 01/14/19 Time [place]: 1229 Representative spoke with at [place]: [place] ([place]) Interventions     Readmission Risk Interventions No flowsheet data found.

## 2019-01-14 NOTE — Discharge Summary (Addendum)
ELECTROPHYSIOLOGY PROCEDURE DISCHARGE SUMMARY    Patient ID: Ana Martin Martin,  MRN: [id number], DOB/AGE: 06-May-1955 63 y.o.  Admit date: 01/05/2019 Discharge date: 01/14/2019  Primary Care Physician: Celene Squibb, MD  Primary Cardiologist: Dr. Harl Bowie Electrophysiologist: Dr. Rayann Heman  Primary Discharge Diagnosis:  1. ICD site/pocket infection, erosion  2. Acute on chronic CHF exacerbation 3. COPD  Secondary Discharge Diagnosis:  1. NICM 2. LBBB 3. CHB (s/p AV node ablation)      CRT-D 4. Permanent Afib     CHA2DS2Vasc is 3, on warfarin 5. VHD     Mechanical MVR     On warfarin 6. HTN   Allergies  Allergen Reactions  . Codeine Nausea And Vomiting    Stomach cramp     Procedures This Admission:  1. 01/05/2019 Dr. Servando Snare PROCEDURE:   Mini left thoracotomy, removal of epicardial pacing leads and placement of temporary epicardial pacing lead. 2. 01/05/2019 Dr. Lovena Le Conclusion: Successful extraction of a Medtronic BiV ICD, Biotronik atrial and defibrillator leads, and epicardial leads which will be dictated under the direction of Dr. Servando Snare.   3. 01/07/2019 Dr. Lovena Le Insertion of the temporary permanent transvenous pacemaker  4. 01/12/2019 Dr. Lovena Le CONCLUSIONS:   1. 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   2. Successful biventricular ICD implantation.   3. No early apparent complications.   She has had CXR x2 post-op without ptx   O2 walk Rest on RA 82% ambulation RA O2 sat was 79% Rest O2 2L 97-98% Ambulating O2 at 2L 93%     Brief HPI: Ana Martin Martin is a 63 y.o. female with above PMHx with history of her 1st device implant 2009, had an upgrade 2013 and generator change this year.  She developed pocket infection leading to skin erosion requiring system extraction.  She had 2 epicardial leads and would require surgical removal of these.  She has known CHB  requiring interval temp-perm placement, and eventual new device implant on the contralateral side.  Risks, benefits, and alternatives to the procedures, in the environment of a mechanical MV requiring time off a/c were reviewed with the patient who wished to proceed.   Hospital Course:  The patient was admitted and underwent ICD system extraction without complication, initially supported by her surgical epicardial wires.  Extubated POD #1.  She underwent Temp-perm placement 01/07/2019, also without complication.  Given her mechanical MVR her coumadin was resumed throughout her hospitalization.  She developed extraction site hematoma managed with a pressure dressing for a few days.  This remained stable throughout her stay.  She did have a dip in her Hgb though also remains stable.  The patient's inter-op TEE noted LVEF 40%, Dr. Lovena Le discussed replacing with CRT-P vs CRT-D and was decided to purse CRT-D.  The patient slow to mobilize 2/2 thoracotomy site discomfort.  She was transferred out of ICU.  She developed lethargy 2/2 over oxygenation with her baseline COPD and did very well with 1-2L n/c.  Underwent new CRT-D implant 01/12/2019.  POD 1 unable to ambulate on O2 without desaturation and was further diuresed.  She is cumulatively negative -3760ml.   She was recommended initially by PT for SNF, though she declines.  She is agreeable to [facility].  She today ambulated independently to the bathroom and reports she has help at home. SHe has required O2.  She has longstanding diagnosis of COPD though never prescribed O2.  She  mentioned that she feels like she needs oxygen often, and has her Mom's Ot at home that she uses fairly often. She is being ordered for home O2, and instructed to see her PMD after discharge for ongoing management of her O2 and COPD, and recommend she discuss getting a formal referral to a pulmonologist.  The patient was seen examined by Dr. Lovena Le and considered stable for  discharge to home with home health and oxygen.  Early follow up is in place with our service for wound management next week.   ADDEND:(Late entry):  Patient's insurance would not cover [place], I discussed prior to her leaving if she felt comfortable with PT and no home nursing.  The patient felt very comfortable with PT alone, said she had no problems with her medicines or concerns with her wounds, care instructions.  Home health PT/OT was ordered.  AFter discharge, notified OT would not be covered and her home health changed to PT alone.  Physical Exam: Vitals:   01/14/19 0330 01/14/19 0333 01/14/19 0845 01/14/19 0926  BP: 123/64   134/65  Pulse: 70  70 70  Resp: 19  20   Temp: (!) 97.5 F (36.4 C)     TempSrc: Oral     SpO2: 99%  100%   Weight:  88.2 kg    Height:        Labs:   Lab Results  Component Value Date   WBC 9.7 01/12/2019   HGB 9.5 (L) 01/12/2019   HCT 31.5 (L) 01/12/2019   MCV 89.5 01/12/2019   PLT 181 01/12/2019    Recent Labs  Lab 01/14/19 0140  NA 140  K 3.8  CL 92*  CO2 39*  BUN 16  CREATININE 0.70  CALCIUM 8.9  GLUCOSE 103*    Discharge Medications:  Allergies as of 01/14/2019      Reactions   Codeine Nausea And Vomiting   Stomach cramp      Medication List    STOP taking these medications   cephALEXin 500 MG capsule Commonly known as: Keflex     TAKE these medications   acetaminophen 500 MG tablet Commonly known as: TYLENOL Take 1,000 mg by mouth every 8 (eight) hours as needed for moderate pain or headache.   ALPRAZolam 0.5 MG tablet Commonly known as: XANAX Take 0.5 mg by mouth 2 (two) times daily.   amiodarone 200 MG tablet Commonly known as: PACERONE Take 1 tablet (200 mg total) by mouth daily. What changed: when to take this   aspirin EC 81 MG tablet Take 81 mg by mouth at bedtime.   atorvastatin 40 MG tablet Commonly known as: LIPITOR Take 40 mg by mouth at bedtime. for cholesterol   carvedilol 12.5 MG tablet  Commonly known as: COREG Take 12.5 mg by mouth 2 (two) times daily with a meal.   fluticasone 50 MCG/ACT nasal spray Commonly known as: FLONASE Place 1 spray into both nostrils daily as needed for allergies or rhinitis.   furosemide 40 MG tablet Commonly known as: LASIX Take 1 tablet (40 mg total) by mouth daily. What changed: how much to take   levothyroxine 200 MCG tablet Commonly known as: SYNTHROID Take 200 mcg by mouth daily.   losartan 25 MG tablet Commonly known as: COZAAR Take 25 mg by mouth at bedtime.   Melatonin 3 MG Caps Take 3-6 mg by mouth at bedtime as needed (sleep).   metFORMIN 500 MG 24 hr tablet Commonly known as: GLUCOPHAGE-XR Take 500  mg by mouth daily.   potassium chloride SA 20 MEQ tablet Commonly known as: KLOR-CON Take 10-20 mEq by mouth daily. Take 10 meq when taking 20 mg of lasix, take 20 meq when taking 40 mg of lasix   ProAir HFA 108 (90 Base) MCG/ACT inhaler Generic drug: albuterol Inhale 1-2 puffs into the lungs every 4 (four) hours as needed for wheezing or shortness of breath.   promethazine 25 MG tablet Commonly known as: PHENERGAN Take 25 mg by mouth at bedtime.   spironolactone 25 MG tablet Commonly known as: ALDACTONE TAKE 1/2 TABLET BY MOUTH DAILY   Symbicort 160-4.5 MCG/ACT inhaler Generic drug: budesonide-formoterol Inhale 2 puffs into the lungs 2 (two) times daily as needed (shortness of breath).   venlafaxine XR 150 MG 24 hr capsule Commonly known as: EFFEXOR-XR Take 150 mg by mouth at bedtime.   Vitamin D (Ergocalciferol) 1.25 MG (50000 UT) Caps capsule Commonly known as: DRISDOL Take 50,000 Units by mouth every Monday.   warfarin 2.5 MG tablet Commonly known as: COUMADIN Take as directed. If you are unsure how to take this medication, talk to your nurse or doctor. Original instructions: TAKE 1 TABLET BY MOUTH DAILY EXCEPT ON TUESDAY AND FRIDAYS TAKE ONE-HALF TABLET What changed:   how much to take  how to  take this  when to take this  additional instructions            Durable Medical Equipment  (From admission, onward)         Start     Ordered   01/14/19 1101  For home use only DME oxygen  Once    Comments: Please evaluate for portable oxygen as well  Question Answer Comment  Length of Need Lifetime   Mode or (Route) Nasal cannula   Liters per Minute 2   Frequency Continuous (stationary and portable oxygen unit needed)   Oxygen conserving device Yes   Oxygen delivery system Gas      01/14/19 1106          Disposition:  Discharge Instructions    Diet - low sodium heart healthy   Complete by: As directed    Increase activity slowly   Complete by: As directed      Follow-up Information    Grace Isaac, MD Follow up.   Specialty: Cardiothoracic Surgery Why: Appointment to see Dr. Servando Snare on 01/27/2019 at 9 AM.  Obtain a chest x-ray at [facility] at 8:30 AM.  It is located in the same office complex Contact information: [place] [address] [phone]        Baldwin Jamaica, PA-C Follow up.   Specialty: Cardiology Why: 01/17/2019 @ 8:00AM, wound check visit Contact information: [address] [place] [address] [phone]        Shirley Friar, PA-C Follow up.   Specialty: Physician Assistant Why: 01/26/2019 @ 10:30AM, wound check visit Contact information: [address] [place] [address] [phone]        Evans Lance, MD Follow up.   Specialty: Cardiology Why: 04/20/2019 @ 2:15PM Contact information: [address] [place] [address] [phone]        Celene Squibb, MD Follow up.   Specialty: Internal Medicine Why: call to make a 2-3 week post hospital follow up.  Your Primary doctor will need to manage your oxygen orders in the future, recommend discussing pulmonary referral as  well. Contact information: [place] [facility] [address] [phone]           Duration of Discharge Encounter: Greater than 30 minutes including physician time.  Venetia Night, PA-C 01/14/2019 1:17 PM  EP Attending  Patient seen and examined. Agree with the findings as noted above. The patient is stable for DC home. Followup of her ICD as above. She will have her surgical extraction sutures removed next week and a PT/INR in our office.  Mikle Bosworth.D.

## 2019-01-14 NOTE — Plan of Care (Signed)
  Problem: Clinical Measurements: Goal: Ability to maintain clinical measurements within normal limits will improve Outcome: Progressing   Problem: Activity: Goal: Risk for activity intolerance will decrease Outcome: Progressing   

## 2019-01-15 ENCOUNTER — Telehealth: Payer: Self-pay | Admitting: Physician Assistant

## 2019-01-15 LAB — GLUCOSE, CAPILLARY
Glucose-Capillary: 147 mg/dL — ABNORMAL HIGH (ref 70–99)
Glucose-Capillary: 109 mg/dL — ABNORMAL HIGH (ref 70–99)

## 2019-01-15 NOTE — Telephone Encounter (Signed)
Called patient to check in and make sure she had skipped her warfarin does last night and was clear on her instructions, Ana Martin (who told me he was "like her husband" who we have communicated with during her hospitalization) answered, the patient participated in the call as well, confirming her Ana Martin and permission to discuss with Ana Martin her medical information.  She did not take her warfarin last night as we discussed and was reminded to resume at her usual dosing schedule tonight, both the patient and Ana Martin state understanding.    Ana Martin was to be coming to day to get them future supply of her O2 and refill/home tank as well.  Ana Martin reported the pt a little anxious about transitioning to home but doing well.  The patient tells me she was tired this morning but felt OK over all.  She had no current concerns.  Ana Martin mentioned he was going to make her meatloaf and potatoes something she was looking forward to eating, discussed caution with sodium.  Confirmed with them her appt with me on Monday.  Ana Martin asked to accompany, this would be OK with me.  Encouraged them both to call with any concerns, questions.  They were appreciative of the follow up and call.  Tommye Standard, PA-C

## 2019-01-15 NOTE — Progress Notes (Signed)
Cardiology Office Note Date:  01/17/2019  Patient ID:  Kimbrly, Ferwerda 06/29/1955, MRN [id number] PCP:  Celene Squibb, MD  Cardiologist:  Dr. Harl Bowie Electrophysiologist: Dr. Rayann Heman >> pt prefers to transfer to Dr. Lovena Le Linna Hoff)     Chief Complaint: post hospital suture removal   History of Present Illness: PEARLENE HICKMOTT is a 63 y.o. female with history of NICM, LBBB, CHB (s/p AV node ablation), permanent AFib, VHD w/mechanical MVR, HTN, COPD.  She comes in today to be seen for Dr. Lovena Le.  She had h/o her initial device implant 2009, underwent upgrade of her device 2013, and a generator change 2020.  Subsequently developed pocket infection and device erosion requiring extraction of her entire system. This included CTS involvement with L thoracotomy to remove her epicardial wires.   1. 01/05/2019 Dr. Servando Snare PROCEDURE: Mini left thoracotomy, removal of epicardial pacing leads and placement of temporary epicardial pacing lead. 2. 01/05/2019 Dr. Lovena Le Conclusion: Successful extraction of a Medtronic BiV ICD, Biotronik atrial and defibrillator leads, and epicardial leads which will be dictated under the direction of Dr. Servando Snare.   3. 01/07/2019 Dr. Lovena Le Insertion of the temporary permanent transvenous pacemaker  4. 01/12/2019 Dr. Lovena Le CONCLUSIONS:  1. 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  2. Successful biventricular ICD implantation.  3. No early apparent complications.     Post extraction she developed L site hematoma, given her mechanical MVR, attempts to keep her INR low 2's with pressure dressing.. She carries dx of COPD, and was unable to wean off O2 and sent home with oxygen Inter op TEE LVEF 40% verbally by Dr. Lovena Le (unable to find formal report), in d/w the patient, decided to pursue CRT-D again.  She comes in today for removal of her extraction site  sutures, CTS asked at our visit today to remove her CT suture as well.    INR today is planned as well.   She comes today accompanied by her husband.  She mentions they are not marrred, but have been together >20 years.  He is very involved in today's visit and her home care.   Mentions that yesterday was her best day since home, he helped her get bathed and washed her hair.  The patient mentions that felt "Great!".  She mentions the first day or 2 home was difficult, felt weak, anxious, and just kind of "out of it", though not disoriented or confused, just didn't feel like she had the energy to do much.  Yesterday and today are better, though still generally without much energy. She denies any CP, palpitations, or overt cardiac awareness.  She is anxious about bing on O2.Wants to wean herself off.    No dizzy spells, no near syncope or syncope, no shocks   Her INR today was 2.6   Device information MDT CRT-D, implanted 01/12/2019  DEVICE DEPENDENT Prior device hx Initial implant 2009, upgrade 2013, gen change 2020 >>> system extraction 2/2 infection 01/2019     Past Medical History:  Diagnosis Date  . AICD (automatic cardioverter/defibrillator) present   . Anxiety   . Asthma   . Atrial fibrillation ([place])   . Automatic implantable cardiac defibrillator in situ    [place] DR-T 09-20-2007 Dr.Akbary  . COPD (chronic obstructive pulmonary disease) ([place])   . Depression   . Diabetes mellitus without complication ([place])    Type II  . Hyperlipidemia   . Hypersomnia, unspecified  related to known Axis III factor  . Hypertension   . Lump or mass in breast    at the 7 o'clock position of right breast(non-tender)  . Other abnormal glucose   . Other left bundle branch block   . Other primary cardiomyopathies   . PONV (postoperative nausea and vomiting)   . Presence of permanent cardiac pacemaker   . Seizure ([place])   . Thyroid cancer ([place])   . Thyroid disease   . Unspecified vitamin  D deficiency   . Wolff-Parkinson-White syndrome     Past Surgical History:  Procedure Laterality Date  . ABDOMINAL HYSTERECTOMY    . BIV ICD GENERATOR CHANGEOUT N/A 07/29/2018   Procedure: BIV ICD GENERATOR CHANGEOUT;  Surgeon: Thompson Grayer, MD;  Location: [facility];  Service: Cardiovascular;  Laterality: N/A;  . BIV PACEMAKER INSERTION CRT-P N/A 01/12/2019   Procedure: BIV PACEMAKER INSERTION CRT-P;  Surgeon: Evans Lance, MD;  Location: [facility];  Service: Cardiovascular;  Laterality: N/A;  . CARDIAC VALVE REPLACEMENT     st jude silzone mitray mechanical [id number] ss# [id number]  . CHOLECYSTECTOMY    . COLONOSCOPY     April 12, 2008  . EPICARDIAL PACING LEAD PLACEMENT N/A 01/05/2019   Procedure: REMOVAL OF EPICARDIAL PACING LEAD PLEURALS X2 WITH PLACEMENT OF TEMPORARY EPICARDIAL PACING LEADS;  Surgeon: Grace Isaac, MD;  Location: [facility];  Service: Thoracic;  Laterality: N/A;  . ICD LEAD REMOVAL N/A 01/05/2019   Procedure: - MEDTRONIC BIV  ICD SYSTEM REMOVAL AND TEMPORARY PACEMAKER PLACEMENT;  Surgeon: Evans Lance, MD;  Location: [facility];  Service: Cardiovascular;  Laterality: N/A;  . JOINT REPLACEMENT    . PACEMAKER IMPLANT N/A 01/07/2019   Procedure: PACEMAKER IMPLANT;  Surgeon: Evans Lance, MD;  Location: [facility];  Service: Cardiovascular;  Laterality: N/A;  . PACEMAKER PLACEMENT    . SHOULDER SURGERY     Left  . THORACOTOMY Left 01/05/2019   Procedure: LEFT MINI THORACOTOMY;  Surgeon: Grace Isaac, MD;  [facility];  Service: Thoracic;  Laterality: Left;    Current Outpatient Medications  Medication Sig Dispense Refill  . acetaminophen (TYLENOL) 500 MG tablet Take 1,000 mg by mouth every 8 (eight) hours as needed for moderate pain or headache.    . albuterol (PROAIR HFA) 108 (90 Base) MCG/ACT inhaler Inhale 1-2 puffs into the lungs every 4 (four) hours as needed for wheezing or shortness of breath.     . ALPRAZolam (XANAX) 0.5 MG  tablet Take 0.5 mg by mouth 2 (two) times daily.   0  . amiodarone (PACERONE) 200 MG tablet Take 1 tablet (200 mg total) by mouth daily. (Patient taking differently: Take 200 mg by mouth at bedtime. ) 30 tablet 0  . aspirin EC 81 MG tablet Take 81 mg by mouth at bedtime.     Marland Kitchen atorvastatin (LIPITOR) 40 MG tablet Take 40 mg by mouth at bedtime. for cholesterol  0  . budesonide-formoterol (SYMBICORT) 160-4.5 MCG/ACT inhaler Inhale 2 puffs into the lungs 2 (two) times daily as needed (shortness of breath).     . carvedilol (COREG) 12.5 MG tablet Take 12.5 mg by mouth 2 (two) times daily with a meal.    . fluticasone (FLONASE) 50 MCG/ACT nasal spray Place 1 spray into both nostrils daily as needed for allergies or rhinitis.    . furosemide (LASIX) 40 MG tablet Take 1 tablet (40 mg total) by mouth daily. (Patient taking  differently: Take 20-40 mg by mouth daily. ) 30 tablet 0  . levothyroxine (SYNTHROID) 200 MCG tablet Take 200 mcg by mouth daily.     Marland Kitchen losartan (COZAAR) 25 MG tablet Take 25 mg by mouth at bedtime.   4  . Melatonin 3 MG CAPS Take 3-6 mg by mouth at bedtime as needed (sleep).    . metFORMIN (GLUCOPHAGE-XR) 500 MG 24 hr tablet Take 500 mg by mouth daily.     . potassium chloride SA (K-DUR) 20 MEQ tablet Take 10-20 mEq by mouth daily. Take 10 meq when taking 20 mg of lasix, take 20 meq when taking 40 mg of lasix    . promethazine (PHENERGAN) 25 MG tablet Take 25 mg by mouth at bedtime.     Marland Kitchen spironolactone (ALDACTONE) 25 MG tablet TAKE 1/2 TABLET BY MOUTH DAILY (Patient taking differently: Take 12.5 mg by mouth daily. ) 45 tablet 0  . venlafaxine XR (EFFEXOR-XR) 150 MG 24 hr capsule Take 150 mg by mouth at bedtime.    . Vitamin D, Ergocalciferol, (DRISDOL) 1.25 MG (50000 UT) CAPS capsule Take 50,000 Units by mouth every Monday.     . warfarin (COUMADIN) 2.5 MG tablet TAKE 1 TABLET BY MOUTH DAILY EXCEPT ON TUESDAY AND FRIDAYS TAKE ONE-HALF TABLET (Patient taking differently: Take 1.25-2.5 mg  by mouth See admin instructions. Take 2.5 mg every night except Fridays take 1.25 mg at night) 90 tablet 1   No current facility-administered medications for this visit.    Allergies:   Codeine   Social History:  The patient  reports that she quit smoking about 9 years ago. Her smoking use included cigarettes. She has a 25.00 pack-year smoking history. She has never used smokeless tobacco. She reports that she does not drink alcohol or use drugs.   Family History:  The patient's family history includes COPD in her mother; Lung cancer in her father; Sarcoidosis in her mother.  ROS:  Please see the history of present illness. All other systems are reviewed and otherwise negative.   PHYSICAL EXAM:  VS:  BP 134/64   Pulse 71   Ht 5\' 8"  (1.727 m)   Wt 191 lb (86.6 kg)   BMI 29.04 kg/m  BMI: Body mass index is 29.04 kg/m. Well nourished, well developed, in no acute distress, looks older then her age, chronically ill appearing  HEENT: normocephalic, atraumatic  Neck: no JVD, carotid bruits or masses Cardiac:  RRR (paced); no significant murmurs, no rubs, or gallops Lungs:  Diminished at the basesl, no wheezing, no rhonchi or rales  Abd: soft, nontender MS: no deformity or, advanced atrophy for age Ext: no edema  Skin: warm and dry Neuro:  No gross deficits appreciated Psych: euthymic mood, full affect  extraction site (L side): Hematoma size is slightly smaller, ecchymosis of various colors, stages, is soft, nontender, skin edges appear closed, with some scabbing.  Sutures are removed without difficulty, and initially site appeared intact, though when putting her jacket/vest back on noted blood on her shirt and a 2cm are mid incision skin edges has opened with minimal oozing. Discussed with Dr. Lovena Le, described my findings, he mentioned being unsurprised, and instructed to place steri-strips, and ultimately will allow secondary intention healing.  I placed clean gauze/loose cover and  given instructions to change if needed to protect her clothing  implant site (R side), steri  strips remain in place, she has large area of ecchymosis including her L breast, small area of hematoma, unchanged  from discharge day  Lthoracotomy site is clean and dry, some scabbing remains, no erythema, edema, or drainage  L chest tube site is clean, dry, suture removed without difficulty, skin edges are well approximated, no erythema, edema or drainage   EKG:  Not done today  ICD interrogation done today and reviewed by myself  Battery and lead measurements are good She was symptomatic with lightheadedness with loss of V capture, I took her LV lead to 1V and stopped the test, her LV lead threshold <1.0V Acute implant outputs remain Ongoing AFib episode No V arrhythmias or device observed VP 100% Pacer dependent    Recent Labs: 01/07/2019: ALT 17 01/12/2019: Hemoglobin 9.5; Platelets 181 01/14/2019: BUN 16; Creatinine, Ser 0.70; Potassium 3.8; Sodium 140  No results found for requested labs within last 8760 hours.   Estimated Creatinine Clearance: 81.9 mL/min (by C-G formula based on SCr of 0.7 mg/dL).   Wt Readings from Last 3 Encounters:  01/17/19 191 lb (86.6 kg)  01/14/19 194 lb 8 oz (88.2 kg)  12/28/18 198 lb (89.8 kg)     Other studies reviewed: Additional studies/records reviewed today include: summarized above  ASSESSMENT AND PLAN:  1. ICD pocket infection/erosion >> system extraction     Extraction site     As above  I counseled the patient/husband on changing the loose gauze if needed and provided supplies Steri strips in place and dry at completion of out visit She is asked not minimize arm use, reminded of her activity restrictions to avoid tension on her L sided site and allow lead maturation on the R side No showering, OK to sponge bath breast level and down including her thoracotomy/CT site gently but NOT upper chest sites  Home health PT expected to start  today, instructed to relay these instructions to them   2. CRT-D implant     Site was dry, small hematoma, extensive ecchymosis, steri-strips remain in place     intact device function     No changes made  3. Thoracotomy site      some scabbing, appears unchanged from day of discharge      CT site, suture removed, clean, dry, appears well healed      keep follow up scheduled with CTS  4. Mechanical MVR 5. Permanent AFib     On warfarin, INR today is 2.6  Dr. Lovena Le instructed to take 1/2 usual dose today and tomorrow, and Wed resume usual dosing again, she will have INR next week again when she comes back in  6. COPD     Now on home O2     revisisted O2 recommendations, cautions     Reminded to call and get f/u with PMD     Not to wean herself of O2 unless instructed to by her PMD  7. NICM     She does not appear volume OL     Encouraged deep breathing at home    Disposition: F/u with wound check/INR next week as planned./  F/u with CTS as scheduled as well.  Call PMD for appointment to f/u on COPD, oxygen and recommend pulmonary referral.  Any wound concerns to see locally in [place] if needed, call if any questions, concerns.  Current medicines are reviewed at length with the patient today.  The patient did not have any concerns regarding medicines.  Venetia Night, PA-C 01/17/2019 12:40 PM     [address] [place] [address] [phone] (  office)  [phone] (fax)

## 2019-01-16 NOTE — H&P (Signed)
ICD Criteria  Current LVEF:35%. Within 12 months prior to implant: No   Heart failure history: Yes, Class III  Cardiomyopathy history: Yes, Non-Ischemic Cardiomyopathy.  Atrial Fibrillation/Atrial Flutter: Yes, Permanent.  Ventricular tachycardia history: No.  Cardiac arrest history: No.  History of syndromes with risk of sudden death: No.  Previous ICD: Yes, Reason for ICD:  Primary prevention.  Current ICD indication: Primary  PPM indication: Yes. Pacing type: Ventricular. Greater than 40% RV pacing requirement anticipated. Indication: Complete Heart Block  Class I or II Bradycardia indication present: Yes  Beta Blocker therapy for 3 or more months: Yes, prescribed.   Ace Inhibitor/ARB therapy for 3 or more months: Yes, prescribed.    I have seen Ana Martin is a 63 y.o. femalepre-procedural and has been referred by Dr. Rayann Heman for consideration of ICD implant for primary prevention of sudden death after undergoing ICD system extraction.  The patient's chart has been reviewed and they meet criteria for ICD implant.  I have had a thorough discussion with the patient reviewing options.  The patient and their family (if available) have had opportunities to ask questions and have them answered. The patient and I have decided together through the [employer] to proceed with ICD at this time.  Risks, benefits, alternatives to ICD implantation were discussed in detail with the patient today. The patient  understands that the risks include but are not limited to bleeding, infection, pneumothorax, perforation, tamponade, vascular damage, renal failure, MI, stroke, death, inappropriate shocks, and lead dislodgement and she wishes to proceed.

## 2019-01-17 ENCOUNTER — Other Ambulatory Visit: Payer: Self-pay

## 2019-01-17 ENCOUNTER — Ambulatory Visit (INDEPENDENT_AMBULATORY_CARE_PROVIDER_SITE_OTHER): Payer: PPO | Admitting: *Deleted

## 2019-01-17 ENCOUNTER — Other Ambulatory Visit: Payer: Self-pay | Admitting: *Deleted

## 2019-01-17 ENCOUNTER — Ambulatory Visit (INDEPENDENT_AMBULATORY_CARE_PROVIDER_SITE_OTHER): Payer: PPO | Admitting: Physician Assistant

## 2019-01-17 VITALS — BP 134/64 | HR 71 | Ht 68.0 in | Wt 191.0 lb

## 2019-01-17 DIAGNOSIS — Z5181 Encounter for therapeutic drug level monitoring: Secondary | ICD-10-CM

## 2019-01-17 DIAGNOSIS — J449 Chronic obstructive pulmonary disease, unspecified: Secondary | ICD-10-CM

## 2019-01-17 DIAGNOSIS — I4891 Unspecified atrial fibrillation: Secondary | ICD-10-CM

## 2019-01-17 DIAGNOSIS — Z95810 Presence of automatic (implantable) cardiac defibrillator: Secondary | ICD-10-CM | POA: Diagnosis not present

## 2019-01-17 DIAGNOSIS — I4821 Permanent atrial fibrillation: Secondary | ICD-10-CM

## 2019-01-17 DIAGNOSIS — I428 Other cardiomyopathies: Secondary | ICD-10-CM | POA: Diagnosis not present

## 2019-01-17 DIAGNOSIS — I5022 Chronic systolic (congestive) heart failure: Secondary | ICD-10-CM | POA: Diagnosis not present

## 2019-01-17 DIAGNOSIS — Z9889 Other specified postprocedural states: Secondary | ICD-10-CM

## 2019-01-17 DIAGNOSIS — Z952 Presence of prosthetic heart valve: Secondary | ICD-10-CM | POA: Diagnosis not present

## 2019-01-17 DIAGNOSIS — Z4889 Encounter for other specified surgical aftercare: Secondary | ICD-10-CM

## 2019-01-17 LAB — POCT INR: INR: 2.6 (ref 2.0–3.0)

## 2019-01-17 MED ORDER — WARFARIN SODIUM 2.5 MG PO TABS: ORAL_TABLET | ORAL | 0 refills | Status: AC

## 2019-01-17 NOTE — Patient Instructions (Addendum)
Description   Continue to take 1 tablet daily except for 0.5 a tablet on Fridays. Call coumadin clinic for any questions. Recheck INR in 1 week before your appointment with Jonni Sanger.

## 2019-01-17 NOTE — Patient Instructions (Signed)
  Medication Instructions:   AS INSTRUCTED BY COUMADIN CLINIC: 1/2 DOSE TONIGHT 1/2 DOSE TOMORROW THE RESUME AS USUAL    *If you need a refill on your cardiac medications before your next appointment, please call your pharmacy*  Lab Work: NONE ORDERED  TODAY   If you have labs (blood work) drawn today and your tests are completely normal, you will receive your results only by: Marland Kitchen MyChart Message (if you have MyChart) OR . A paper copy in the mail If you have any lab test that is abnormal or we need to change your treatment, we will call you to review the results.  Testing/Procedures: NONE ORDERED  TODAY'  Follow-Up: At [facility], you and your health needs are our priority.  As part of our continuing mission to provide you with exceptional heart care, we have created designated Provider Care Teams.  These Care Teams include your primary Cardiologist (physician) and Advanced Practice Providers (APPs -  Physician Assistants and Nurse Practitioners) who all work together to provide you with the care you need, when you need it.  Your next appointment:    AS SCHEDULED WITH TILLERY PA-C ON 01-26-19   Other Instructions  MAKE SURE YOU CHANGE GUAZE AS NEEDED

## 2019-01-18 DIAGNOSIS — I700 Atherosclerosis of aorta: Secondary | ICD-10-CM | POA: Diagnosis not present

## 2019-01-18 DIAGNOSIS — E079 Disorder of thyroid, unspecified: Secondary | ICD-10-CM | POA: Diagnosis not present

## 2019-01-18 DIAGNOSIS — I2510 Atherosclerotic heart disease of native coronary artery without angina pectoris: Secondary | ICD-10-CM | POA: Diagnosis not present

## 2019-01-18 DIAGNOSIS — I442 Atrioventricular block, complete: Secondary | ICD-10-CM | POA: Diagnosis not present

## 2019-01-18 DIAGNOSIS — I509 Heart failure, unspecified: Secondary | ICD-10-CM | POA: Diagnosis not present

## 2019-01-18 DIAGNOSIS — E119 Type 2 diabetes mellitus without complications: Secondary | ICD-10-CM | POA: Diagnosis not present

## 2019-01-18 DIAGNOSIS — I110 Hypertensive heart disease with heart failure: Secondary | ICD-10-CM | POA: Diagnosis not present

## 2019-01-18 DIAGNOSIS — T827XXD Infection and inflammatory reaction due to other cardiac and vascular devices, implants and grafts, subsequent encounter: Secondary | ICD-10-CM | POA: Diagnosis not present

## 2019-01-18 DIAGNOSIS — I2721 Secondary pulmonary arterial hypertension: Secondary | ICD-10-CM | POA: Diagnosis not present

## 2019-01-18 DIAGNOSIS — F419 Anxiety disorder, unspecified: Secondary | ICD-10-CM | POA: Diagnosis not present

## 2019-01-18 DIAGNOSIS — J432 Centrilobular emphysema: Secondary | ICD-10-CM | POA: Diagnosis not present

## 2019-01-18 DIAGNOSIS — I428 Other cardiomyopathies: Secondary | ICD-10-CM | POA: Diagnosis not present

## 2019-01-18 DIAGNOSIS — I4821 Permanent atrial fibrillation: Secondary | ICD-10-CM | POA: Diagnosis not present

## 2019-01-19 ENCOUNTER — Other Ambulatory Visit: Payer: Self-pay | Admitting: Cardiothoracic Surgery

## 2019-01-19 ENCOUNTER — Telehealth: Payer: Self-pay | Admitting: Physician Assistant

## 2019-01-19 DIAGNOSIS — T82111A Breakdown (mechanical) of cardiac pulse generator (battery), initial encounter: Secondary | ICD-10-CM | POA: Diagnosis not present

## 2019-01-19 DIAGNOSIS — R5381 Other malaise: Secondary | ICD-10-CM | POA: Diagnosis not present

## 2019-01-19 DIAGNOSIS — Z9889 Other specified postprocedural states: Secondary | ICD-10-CM

## 2019-01-19 NOTE — Telephone Encounter (Signed)
Husband of patient called. The patient's butterfly closures on her pacemaker incision split yesterday. Her husband had to take her by EMS to a non-[facility] yesterday to get it re-bandaged. The husband reports that her O2 stats were at 87. He is concerned because she bled a tremendous amount yesterday. The husband had to change her shirts 3 different times. The husband does not know what to do

## 2019-01-19 NOTE — Telephone Encounter (Signed)
Call placed to Pt.  Per Pt she does not need any assistance with wound care.  Pt states Aaron Edelman is overreacting.  Per Pt she just needs oxygen.  Per review of Emily's note, she had contacted [employer] and given [employer]'s number to [place].  Per Pt she does not need to see Dr. Nevada Crane.  She plans on seeing [place] next week at her follow up appointment.  Will advise Dr. Lovena Le.

## 2019-01-19 NOTE — Telephone Encounter (Signed)
Pt husband had to take her to the ER because she would not stop bleeding and he was told to call. Renee or the device clinic.

## 2019-01-19 NOTE — Telephone Encounter (Signed)
Spoke with Ana Martin, patient's significant other. Patient's extraction site incision started bleeding last night, steri-strips came off, patient ended up having to go to [facility] ED for assessment. Ana Martin reports that in the ED they "sucked out a pint of blood" and packed wound. Told him to change dressings daily. Ana Martin is very upset on the phone, reports that he is not able to change the dressings. Recommended f/u today in office, Ana Martin reports that they live in [place] right now and that they cannot get to [facility] as she does not have enough oxygen in her tanks. Patient reports that dressing is intact, they are just trying to make a plan regarding her wound care.   Ana Martin reports that her O2 sat was 72% by the time they got home yesterday as she had run out of oxygen in the car. Some disorientation at that time, now back at baseline and comfortable. Currently on 2L via home concentrator, sat 95% at time of this call. Ana Martin reports that he needs to figure out her home O2 situation as they cannot go anywhere safely. Reports he was never told who to call or when.   Discussed with Dr. Lovena Le. Recommendation to begin home health RN services for wound care.   Contacted Ronnell [place] with [employer]. Pt does have home equipment, including portable tank that was supplied at discharge and a home-fill system with 2-3 tanks. Pt was instructed to contact Marlyce Huge office about any needs going forward as Demetrio Lapping is out of the [place] territory. Jeneen Rinks agrees to reach out to [place] office to request that they contact pt.  Spoke with Ana Martin. He confirms she does have a home-fill system, but disputes that she has the equipment that she needs. Encouraged him to contact [facility] in [place], phone number given. Advised will forward message to Sonia Baller, RN, to assist with setting up home health services. Pt's current address is [address], [place], [place]. Pt has appointment with Dr. Nevada Crane (PCP) in [place]  tomorrow. No further wound-related questions at this time.

## 2019-01-20 ENCOUNTER — Ambulatory Visit: Payer: PPO

## 2019-01-20 DIAGNOSIS — J449 Chronic obstructive pulmonary disease, unspecified: Secondary | ICD-10-CM | POA: Diagnosis not present

## 2019-01-20 NOTE — Telephone Encounter (Signed)
Dr. Lovena Le made aware that Pt is refusing [facility] services at this time.

## 2019-01-21 ENCOUNTER — Telehealth: Payer: Self-pay | Admitting: Internal Medicine

## 2019-01-21 NOTE — Telephone Encounter (Signed)
Patients husband is calling requesting they have a nurse come out to clean the patients wound from her recent procedure preformed by Dr. Lovena Le. He states he has not changed the bandages or cleaned the wound since the procedure and is fearful of it getting infected. Please Advise.

## 2019-01-21 NOTE — Telephone Encounter (Signed)
Returned call to Pt.  No answer, left detailed message.  Advised husband had called office requesting a [place] nurse come out for Pt wound care.  Previously discussed this with Pt.  Per [facility] in [place], each nursing visit is $240.  And they are nOT accepting self pay at this time.  Pt had denied need for [facility] nurse during last phone call.  Had stated she just needed oxygen.  Advised to call back if any further questions.  Pt has appt on 12/23 already, and is earliest a doctor will be available.    Left # to call back if any further questions.

## 2019-01-24 ENCOUNTER — Telehealth: Payer: Self-pay | Admitting: Internal Medicine

## 2019-01-24 DIAGNOSIS — S21102A Unspecified open wound of left front wall of thorax without penetration into thoracic cavity, initial encounter: Secondary | ICD-10-CM | POA: Diagnosis not present

## 2019-01-24 DIAGNOSIS — Z95810 Presence of automatic (implantable) cardiac defibrillator: Secondary | ICD-10-CM | POA: Diagnosis not present

## 2019-01-24 DIAGNOSIS — L7682 Other postprocedural complications of skin and subcutaneous tissue: Secondary | ICD-10-CM | POA: Diagnosis not present

## 2019-01-24 DIAGNOSIS — L03313 Cellulitis of chest wall: Secondary | ICD-10-CM | POA: Diagnosis not present

## 2019-01-24 DIAGNOSIS — T8149XA Infection following a procedure, other surgical site, initial encounter: Secondary | ICD-10-CM | POA: Diagnosis not present

## 2019-01-24 NOTE — Telephone Encounter (Addendum)
Spoke with patient and sister in law at patient request. Call was done by speaker phone at patient's location.Patient reports physical therapy changed dressing at her home on 01/21/19 and  contacted home health to set up care for her. No information received from any home health agency. Reports that sister -in -law changed dressing today on left chest extraction site and there was foul smelling drainage when dressing was changed. Patient reported no chills and no fever. Explained that patient has appointment with [place] PA 01/26/19 and to keep appointment with him. Sister-in -law stated that she was concerned the patient was " septic" and that dressing was becoming saturated with drainage every 90 minutes. Recommended that patient be seen in nearest ED if she has that much drainage from wound and the family is concerned that she is septic. Patient and family requested patient be seen in office tomorrow in case she is "septic". I informed patient that if the patient and family were concerned that the infection had progressed to "sepsis" that the patient should seek treatment at the nearest ED. Educated that sepsis is life threatening condition and patient should seek treatment at nearest ED if the family was concerned. S/Sx of infection and sepsis reviewed with patient and family member and ED precautions given. Patient reports she will wait for office appointment . Will have scheduler place patient on device clinic schedule for 01/25/19 @1430  and patient will be notified of appointment.

## 2019-01-24 NOTE — Telephone Encounter (Signed)
     1. Has your device fired? no  2. Is you device beeping? no  3. Are you experiencing draining or swelling at device site? Draining, bleeding, foul smell x 4 days  4. Are you calling to see if we received your device transmission? no  5. Have you passed out? no    Please route to [place]

## 2019-01-26 ENCOUNTER — Ambulatory Visit (INDEPENDENT_AMBULATORY_CARE_PROVIDER_SITE_OTHER): Payer: PPO | Admitting: Student

## 2019-01-26 ENCOUNTER — Other Ambulatory Visit: Payer: Self-pay

## 2019-01-26 ENCOUNTER — Telehealth: Payer: Self-pay | Admitting: Student

## 2019-01-26 ENCOUNTER — Encounter: Payer: Self-pay | Admitting: Student

## 2019-01-26 ENCOUNTER — Ambulatory Visit (INDEPENDENT_AMBULATORY_CARE_PROVIDER_SITE_OTHER): Payer: PPO | Admitting: Pharmacist

## 2019-01-26 VITALS — Wt 187.6 lb

## 2019-01-26 DIAGNOSIS — Z952 Presence of prosthetic heart valve: Secondary | ICD-10-CM | POA: Diagnosis not present

## 2019-01-26 DIAGNOSIS — I5022 Chronic systolic (congestive) heart failure: Secondary | ICD-10-CM

## 2019-01-26 DIAGNOSIS — I4891 Unspecified atrial fibrillation: Secondary | ICD-10-CM

## 2019-01-26 DIAGNOSIS — J449 Chronic obstructive pulmonary disease, unspecified: Secondary | ICD-10-CM

## 2019-01-26 DIAGNOSIS — Z4502 Encounter for adjustment and management of automatic implantable cardiac defibrillator: Secondary | ICD-10-CM

## 2019-01-26 DIAGNOSIS — Z5181 Encounter for therapeutic drug level monitoring: Secondary | ICD-10-CM

## 2019-01-26 DIAGNOSIS — Z9889 Other specified postprocedural states: Secondary | ICD-10-CM

## 2019-01-26 DIAGNOSIS — I4821 Permanent atrial fibrillation: Secondary | ICD-10-CM

## 2019-01-26 DIAGNOSIS — Z95810 Presence of automatic (implantable) cardiac defibrillator: Secondary | ICD-10-CM

## 2019-01-26 LAB — CUP PACEART INCLINIC DEVICE CHECK
Battery Remaining Longevity: 54 mo
Battery Voltage: 3.11 V
Brady Statistic AP VP Percent: 0.68 %
Brady Statistic AP VS Percent: 0 %
Brady Statistic AS VP Percent: 99.3 %
Brady Statistic AS VS Percent: 0.03 %
Brady Statistic RA Percent Paced: 0.59 %
Brady Statistic RV Percent Paced: 99.97 %
Date Time Interrogation Session: 20201223114227
HighPow Impedance: 73 Ohm
Implantable Lead Implant Date: 20201209
Implantable Lead Implant Date: 20201209
Implantable Lead Implant Date: 20201209
Implantable Lead Location: 753858
Implantable Lead Location: 753859
Implantable Lead Location: 753860
Implantable Lead Model: 3830
Implantable Lead Model: 5076
Implantable Lead Model: 6935
Implantable Pulse Generator Implant Date: 20201209
Lead Channel Impedance Value: 228 Ohm
Lead Channel Impedance Value: 304 Ohm
Lead Channel Impedance Value: 399 Ohm
Lead Channel Impedance Value: 418 Ohm
Lead Channel Impedance Value: 456 Ohm
Lead Channel Impedance Value: 589 Ohm
Lead Channel Pacing Threshold Amplitude: 0.875 V
Lead Channel Pacing Threshold Pulse Width: 0.4 ms
Lead Channel Sensing Intrinsic Amplitude: 2.125 mV
Lead Channel Sensing Intrinsic Amplitude: 2.25 mV
Lead Channel Sensing Intrinsic Amplitude: 3.875 mV
Lead Channel Setting Pacing Amplitude: 3.5 V
Lead Channel Setting Pacing Amplitude: 3.5 V
Lead Channel Setting Pacing Amplitude: 4 V
Lead Channel Setting Pacing Pulse Width: 0.4 ms
Lead Channel Setting Pacing Pulse Width: 1 ms
Lead Channel Setting Sensing Sensitivity: 0.3 mV

## 2019-01-26 LAB — POCT INR: INR: 3 (ref 2.0–3.0)

## 2019-01-26 NOTE — Progress Notes (Addendum)
(  Two part appointment) Wound check appointment. Steri-strips removed. New ICD site without redness or edema. Incision edges approximated, wound well healed. Normal device function. Thresholds, sensing, and impedances consistent with implant measurements. Device programmed at 3.5V for extra safety margin until 3 month visit. Histogram distribution appropriate for patient and level of activity. No mode switches or ventricular arrhythmias noted. Patient educated about wound care, arm mobility, lifting restrictions, shock plan. ROV in 3 months with Dr. Lovena Le, but further pending care of old wound site. See Epic for further. Pt had significant dehiscence of extraction site.   Extraction site with dehiscence as above. Significant as picture. Dr. Lovena Le in to assess. Wet to dry dressings applied and instructions given to continue. She was seen in ED Monday in [place], and started on Bactrim and one other antibiotic, which she is unsure of the name of, and doesn't have with her.   She will see Dr. Servando Snare tomorrow, and Dr. Lovena Le will discuss plan moving forward for her wound care.

## 2019-01-26 NOTE — Telephone Encounter (Signed)
Updated patient device information in [employer] and [employer]  We need serial # for her new monitor so that we can sync with [facility]link, then will need her to send a manual transmission.    Legrand Como [address] [place], PA-C  01/26/2019 2:29 PM

## 2019-01-26 NOTE — Patient Instructions (Signed)
Description   Take your usual 1 tablet today and tomorrow, then take 1/2 tablet on Friday, Saturday, and Sunday because of your Bactrim and cipro antibiotics. Recheck INR on Monday.

## 2019-01-26 NOTE — Patient Instructions (Signed)
DAILY  Remove outer dressing and inner gauze.   Apply STERILE saline to STERILE gauze so that it is MOIST, but NOT dripping.   Pack wound with moist sterile gauze.   OK to apply NON-sterile dressing to the OUTSIDE. Over the sterile dressings.  Do not seal completely with tape, to allow room to breath.    If instructions given by Dr. Servando Snare differ, please defer to his instructions.   Further follow up pending visit and discussion with Dr. Servando Snare

## 2019-01-27 ENCOUNTER — Other Ambulatory Visit: Payer: Self-pay | Admitting: Cardiothoracic Surgery

## 2019-01-27 ENCOUNTER — Ambulatory Visit (INDEPENDENT_AMBULATORY_CARE_PROVIDER_SITE_OTHER): Payer: Self-pay | Admitting: Cardiothoracic Surgery

## 2019-01-27 VITALS — BP 149/87 | HR 70 | Temp 97.5°F | Resp 20 | Wt 187.0 lb

## 2019-01-27 DIAGNOSIS — T8149XA Infection following a procedure, other surgical site, initial encounter: Secondary | ICD-10-CM | POA: Insufficient documentation

## 2019-01-27 DIAGNOSIS — T827XXD Infection and inflammatory reaction due to other cardiac and vascular devices, implants and grafts, subsequent encounter: Secondary | ICD-10-CM

## 2019-01-27 DIAGNOSIS — Z9889 Other specified postprocedural states: Secondary | ICD-10-CM

## 2019-01-27 DIAGNOSIS — R911 Solitary pulmonary nodule: Secondary | ICD-10-CM

## 2019-01-27 NOTE — Telephone Encounter (Signed)
The pt states she will either call or bring the serial number for her monitor on Monday. She is not at home right now she had a doctors appointment.

## 2019-01-27 NOTE — Progress Notes (Signed)
[address]       [place] [address]             [phone]      Ana Martin Medical Record [id number] Date of Birth: Jun 22, 1955  Referring: Evans Lance, MD Primary Care: Celene Squibb, MD Primary Cardiologist: Carlyle Dolly, MD   Chief Complaint:   POST OP FOLLOW UP for removal of epicardial pacing wires, patient is now sent to the office from cardiology for care of the pacemaker pocket wound.  History of Present Illness:     Patient underwent removal of AICD infected and a left infraclavicular pocket Dr. Lovena Le.  Concomitantly the patient required a mini thoracotomy to remove 2 screw and epicardial leads that were for BiV pacing.  The patient noted a rapid recovery from her thoracotomy in spite of her poor pulmonary function.  She was seen yesterday in the cardiology office for follow-up on her pacemaker pocket and implantation of a new pacer on the right.  The left pacer pocket which had been closed by Dr. Lovena Le had significantly opened draining with some necrotic debris.  Patient denies any fever chills     Past Medical History:  Diagnosis Date  . AICD (automatic cardioverter/defibrillator) present   . Anxiety   . Asthma   . Atrial fibrillation ([place])   . Automatic implantable cardiac defibrillator in situ    Lumax DR-T 09-20-2007 Dr.Akbary  . COPD (chronic obstructive pulmonary disease) ([place])   . Depression   . Diabetes mellitus without complication ([place])    Type II  . Hyperlipidemia   . 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 Hypertension   . Lump or mass in breast    at the 7 o'clock position of right breast(non-tender)  . Other abnormal glucose   . Other left bundle branch block   . Other primary cardiomyopathies   . PONV (postoperative nausea and vomiting)   . Presence of permanent cardiac pacemaker   . Seizure ([place])   . Thyroid cancer ([place])   . Thyroid disease   . Unspecified vitamin D  deficiency   . Wolff-Parkinson-White syndrome      Social History   Tobacco Use  Smoking Status Former Smoker  . Packs/day: 1.00  . Years: 25.00  . Pack years: 25.00  . Types: Cigarettes  . Quit date: 10/17/2009  . Years since quitting: 9.2  Smokeless Tobacco Never Used    Social History   Substance and Sexual Activity  Alcohol Use No     Allergies  Allergen Reactions  . Codeine Nausea And Vomiting    Stomach cramp    Current Outpatient Medications  Medication Sig Dispense Refill  . acetaminophen (TYLENOL) 500 MG tablet Take 1,000 mg by mouth every 8 (eight) hours as needed for moderate pain or headache.    . albuterol (PROAIR HFA) 108 (90 Base) MCG/ACT inhaler Inhale 1-2 puffs into the lungs every 4 (four) hours as needed for wheezing or shortness of breath.     . ALPRAZolam (XANAX) 0.5 MG tablet Take 0.5 mg by mouth 2 (two) times daily.   0  . amiodarone (PACERONE) 200 MG tablet Take 1 tablet (200 mg total) by mouth daily. 30 tablet 0  . aspirin EC 81 MG tablet Take 81 mg by mouth at bedtime.     Marland Kitchen atorvastatin (LIPITOR) 40 MG tablet Take 40 mg by mouth at bedtime. for cholesterol  0  . b complex vitamins capsule Take by mouth.    . budesonide-formoterol (SYMBICORT) 160-4.5 MCG/ACT inhaler Inhale 2 puffs into the lungs 2 (two) times daily as needed (shortness of breath).     . carvedilol (COREG) 12.5 MG tablet Take 12.5 mg by mouth 2 (two) times daily with a meal.    . Cholecalciferol 50 MCG (2000 UT) TABS Take by mouth.    . fluticasone (FLONASE) 50 MCG/ACT nasal spray Place 1 spray into both nostrils daily as needed for allergies or rhinitis.    . furosemide (LASIX) 40 MG tablet Take 1 tablet (40 mg total) by mouth daily. 30 tablet 0  . levothyroxine (SYNTHROID) 200 MCG tablet Take 200 mcg by mouth daily.     Marland Kitchen losartan (COZAAR) 25 MG tablet Take 25 mg by mouth at bedtime.   4  . Melatonin 3 MG CAPS Take 3-6 mg by mouth at bedtime as needed (sleep).    . metFORMIN  (GLUCOPHAGE-XR) 500 MG 24 hr tablet Take 500 mg by mouth daily.     . potassium chloride SA (K-DUR) 20 MEQ tablet Take 10-20 mEq by mouth daily. Take 10 meq when taking 20 mg of lasix, take 20 meq when taking 40 mg of lasix    . promethazine (PHENERGAN) 25 MG tablet Take 25 mg by mouth at bedtime.     Marland Kitchen spironolactone (ALDACTONE) 25 MG tablet TAKE 1/2 TABLET BY MOUTH DAILY 45 tablet 0  . venlafaxine XR (EFFEXOR-XR) 150 MG 24 hr capsule Take 150 mg by mouth at bedtime.    . Vitamin D, Ergocalciferol, (DRISDOL) 1.25 MG (50000 UT) CAPS capsule Take 50,000 Units by mouth every Monday.     . warfarin (COUMADIN) 2.5 MG tablet TAKE 1 TABLET BY MOUTH DAILY EXCEPT ON FRIDAYS TAKE ONE-HALF TABLET 90 tablet 0   No current facility-administered medications for this visit.       Physical Exam: BP (!) 149/87   Pulse 70   Temp (!) 97.5 F (36.4 C) (Skin)   Resp 20   Wt 84.8 kg   SpO2 98% Comment: O2 at 3 LPM via [place]  BMI 28.43 kg/m   General appearance: alert, cooperative, appears stated age and no distress Neurologic: intact Heart: regular rate and rhythm, S1, S2 normal, no murmur, click, rub or gallop Lungs: clear to auscultation bilaterally Abdomen: soft, non-tender; bowel sounds normal; no masses,  no organomegaly Extremities: extremities normal, atraumatic, no cyanosis or edema Wound: The patient's mini left thoracotomy is well-healed without evidence of infection The right pacemaker site is intact, the previous pocket for the left pacemaker removal is an open wound 7 cm x 5 cm x 3 cm deep with some undermining in the lower end of the pocket.  The wound was lightly debrided in the office which the patient tolerated without difficulty  A small wound VAC was placed into the wound fit nicely and sealed, due to the lack of ability to obtain a short notice home health needs including a wound VAC pump we used a Praveena plus wound VAC pump temporarily.   Diagnostic Studies & Laboratory data:       Recent Radiology Findings:     DG Chest 2 View  Result Date: 01/13/2019 CLINICAL DATA:  Shortness of breath and hemoptysis, unable to fully raise left arm EXAM: CHEST - 2 VIEW COMPARISON:  Radiograph 01/12/2019 FINDINGS: Pacer/AICD battery pack overlies the right chest wall with leads at the cardiac apex and right atrium in stable  position from prior. Postsurgical changes related to prior CABG including intact and aligned sternotomy wires and multiple surgical clips projecting over the mediastinum. Stable cardiomediastinal contours. Diffuse hazy interstitial opacities with more focal airspace disease in the left lung base and a small to moderate left effusion. No acute osseous or soft tissue abnormality. Surgical clips in the base of the right neck IMPRESSION: Diffuse hazy interstitial opacities with more focal airspace disease in the left lung base and a small to moderate left pleural effusion. Findings may reflect mild edema on a background of atelectatic change. Electronically Signed   By: Lovena Le M.D.   On: 01/13/2019 16:13    Recent Lab Findings: Lab Results  Component Value Date   WBC 9.7 01/12/2019   HGB 9.5 (L) 01/12/2019   HCT 31.5 (L) 01/12/2019   PLT 181 01/12/2019   GLUCOSE 103 (H) 01/14/2019   ALT 17 01/07/2019   AST 19 01/07/2019   NA 140 01/14/2019   K 3.8 01/14/2019   CL 92 (L) 01/14/2019   CREATININE 0.70 01/14/2019   BUN 16 01/14/2019   CO2 39 (H) 01/14/2019   TSH 4.139 09/05/2017   INR 3.0 01/26/2019      Assessment / Plan:   7 x 5 x 3 cm wound breakdown of the left infraclavicular pacemaker pocket-wound was debrided cleaned and complete small black wound VAC sponge was placed attached to a Praveena plus wound VAC suction.  In the meantime we will get the appropriate paperwork for tomorrow per minute suction and have the patient return to the office December 28 for wound check change of wound VAC in her brother and have wound VAC pump for home  use  Medication Changes: No orders of the defined types were placed in this encounter.     Grace Isaac MD      [address] [place] [address] Office [phone]     01/27/2019 10:08 AM

## 2019-01-31 ENCOUNTER — Other Ambulatory Visit: Payer: Self-pay

## 2019-01-31 ENCOUNTER — Ambulatory Visit (INDEPENDENT_AMBULATORY_CARE_PROVIDER_SITE_OTHER): Payer: PPO | Admitting: Pharmacist

## 2019-01-31 ENCOUNTER — Ambulatory Visit
Admission: RE | Admit: 2019-01-31 | Discharge: 2019-01-31 | Disposition: A | Discharge status: Home / Self Care | Payer: PPO | Source: Ambulatory Visit | Attending: Cardiothoracic Surgery | Admitting: Cardiothoracic Surgery

## 2019-01-31 ENCOUNTER — Telehealth: Payer: Self-pay | Admitting: Pharmacist

## 2019-01-31 ENCOUNTER — Ambulatory Visit (INDEPENDENT_AMBULATORY_CARE_PROVIDER_SITE_OTHER): Payer: PPO | Admitting: Physician Assistant

## 2019-01-31 VITALS — BP 142/66 | HR 72 | Temp 97.9°F | Resp 16 | Ht 68.0 in

## 2019-01-31 DIAGNOSIS — R911 Solitary pulmonary nodule: Secondary | ICD-10-CM

## 2019-01-31 DIAGNOSIS — J90 Pleural effusion, not elsewhere classified: Secondary | ICD-10-CM | POA: Diagnosis not present

## 2019-01-31 DIAGNOSIS — Z4800 Encounter for change or removal of nonsurgical wound dressing: Secondary | ICD-10-CM

## 2019-01-31 DIAGNOSIS — Z5181 Encounter for therapeutic drug level monitoring: Secondary | ICD-10-CM

## 2019-01-31 DIAGNOSIS — T827XXD Infection and inflammatory reaction due to other cardiac and vascular devices, implants and grafts, subsequent encounter: Secondary | ICD-10-CM | POA: Diagnosis not present

## 2019-01-31 DIAGNOSIS — T8189XA Other complications of procedures, not elsewhere classified, initial encounter: Secondary | ICD-10-CM | POA: Diagnosis not present

## 2019-01-31 DIAGNOSIS — I4891 Unspecified atrial fibrillation: Secondary | ICD-10-CM | POA: Diagnosis not present

## 2019-01-31 DIAGNOSIS — Z952 Presence of prosthetic heart valve: Secondary | ICD-10-CM | POA: Diagnosis not present

## 2019-01-31 LAB — POCT INR: INR: 2.2 (ref 2.0–3.0)

## 2019-01-31 NOTE — Progress Notes (Signed)
HPI: Patient returns for wound vac change for open left pacemaker pocket.  She was last seen on 01/27/2019 at which time we placed a wound vac dressing and covered with a Pravena closed wound vac system.  Since then the patient has tolerated her wound vac well.  She denies history of fevers, chills.  Her significant other mentions they are not receiving adequate oxygen supplies from their home health supplier.  He states she does not have enough oxygen to make it home.  Current Outpatient Medications  Medication Sig Dispense Refill  . acetaminophen (TYLENOL) 500 MG tablet Take 1,000 mg by mouth every 8 (eight) hours as needed for moderate pain or headache.    . albuterol (PROAIR HFA) 108 (90 Base) MCG/ACT inhaler Inhale 1-2 puffs into the lungs every 4 (four) hours as needed for wheezing or shortness of breath.     . ALPRAZolam (XANAX) 0.5 MG tablet Take 0.5 mg by mouth 2 (two) times daily.   0  . amiodarone (PACERONE) 200 MG tablet Take 1 tablet (200 mg total) by mouth daily. 30 tablet 0  . aspirin EC 81 MG tablet Take 81 mg by mouth at bedtime.     Marland Kitchen atorvastatin (LIPITOR) 40 MG tablet Take 40 mg by mouth at bedtime. for cholesterol  0  . b complex vitamins capsule Take by mouth.    . budesonide-formoterol (SYMBICORT) 160-4.5 MCG/ACT inhaler Inhale 2 puffs into the lungs 2 (two) times daily as needed (shortness of breath).     . carvedilol (COREG) 12.5 MG tablet Take 12.5 mg by mouth 2 (two) times daily with a meal.    . Cholecalciferol 50 MCG (2000 UT) TABS Take by mouth.    . fluticasone (FLONASE) 50 MCG/ACT nasal spray Place 1 spray into both nostrils daily as needed for allergies or rhinitis.    . furosemide (LASIX) 40 MG tablet Take 1 tablet (40 mg total) by mouth daily. 30 tablet 0  . levothyroxine (SYNTHROID) 200 MCG tablet Take 200 mcg by mouth daily.     Marland Kitchen losartan (COZAAR) 25 MG tablet Take 25 mg by mouth at bedtime.   4  . Melatonin 3 MG CAPS Take 3-6 mg by mouth at bedtime as needed  (sleep).    . metFORMIN (GLUCOPHAGE-XR) 500 MG 24 hr tablet Take 500 mg by mouth daily.     . potassium chloride SA (K-DUR) 20 MEQ tablet Take 10-20 mEq by mouth daily. Take 10 meq when taking 20 mg of lasix, take 20 meq when taking 40 mg of lasix    . promethazine (PHENERGAN) 25 MG tablet Take 25 mg by mouth at bedtime.     Marland Kitchen spironolactone (ALDACTONE) 25 MG tablet TAKE 1/2 TABLET BY MOUTH DAILY 45 tablet 0  . venlafaxine XR (EFFEXOR-XR) 150 MG 24 hr capsule Take 150 mg by mouth at bedtime.    . Vitamin D, Ergocalciferol, (DRISDOL) 1.25 MG (50000 UT) CAPS capsule Take 50,000 Units by mouth every Monday.     . warfarin (COUMADIN) 2.5 MG tablet TAKE 1 TABLET BY MOUTH DAILY EXCEPT ON FRIDAYS TAKE ONE-HALF TABLET 90 tablet 0   No current facility-administered medications for this visit.    Physical Exam:  BP (!) 142/66 (BP Location: Right Arm, Patient Position: Sitting, Cuff Size: Normal)   Pulse 72   Temp 97.9 F (36.6 C)   Resp 16   Ht 5\' 8"  (1.727 m)   SpO2 98% Comment: ON O2 @ 2L  BMI 28.43 kg/m  Gen: no apparent distress Heart: RRR Lungs; CTA bilaterally Left chest:  Open pacemaker pocket, beefy red tissue present, + blood present, wound measures 7cm L x 5cm Wx 3 cm Deep  Diagnostic Tests:  CXR shows improvement of left basilar atelectasis/effusion  A/P;  1. We have arranged a home wound vac which will be delivered by [employer] to our office today.  We have also been able to have [facility] care pick up her wound vac nursing care.  This will start on Wednesday 2. Wound vac changed today in the office by me.. patient tolerated without difficulty 3. Resume home MWF wound vac changes starting this Wednesday 12/30 4. RTC on 02/16/2018 for wound check   Ellwood Handler, PA-C [facility] [phone]

## 2019-01-31 NOTE — Telephone Encounter (Signed)
Orders placed with [facility] to have patient INR checked on Monday 1/4

## 2019-02-01 DIAGNOSIS — T8189XA Other complications of procedures, not elsewhere classified, initial encounter: Secondary | ICD-10-CM | POA: Diagnosis not present

## 2019-02-02 NOTE — Telephone Encounter (Signed)
The pt tried giving me the serial number but the serial number on the monitor was not the correct number. I gave the pt my [facility] email address to take the picture of the monitor so I can see the serial number to put into the system. The husband got on the phone and states he is her caregiver and is the one that handle everything. He instruct my not to call her but to call his phone number instead. He states she is disoriented but she sounded fine to me. As soon as I get the photo of the serial number I will put it into [facility].

## 2019-02-04 DIAGNOSIS — J449 Chronic obstructive pulmonary disease, unspecified: Secondary | ICD-10-CM | POA: Diagnosis not present

## 2019-02-04 DIAGNOSIS — I2510 Atherosclerotic heart disease of native coronary artery without angina pectoris: Secondary | ICD-10-CM | POA: Diagnosis not present

## 2019-02-04 DIAGNOSIS — E119 Type 2 diabetes mellitus without complications: Secondary | ICD-10-CM | POA: Diagnosis not present

## 2019-02-04 DIAGNOSIS — T8189XA Other complications of procedures, not elsewhere classified, initial encounter: Secondary | ICD-10-CM | POA: Diagnosis not present

## 2019-02-04 DIAGNOSIS — I2721 Secondary pulmonary arterial hypertension: Secondary | ICD-10-CM | POA: Diagnosis not present

## 2019-02-04 DIAGNOSIS — I110 Hypertensive heart disease with heart failure: Secondary | ICD-10-CM | POA: Diagnosis not present

## 2019-02-04 DIAGNOSIS — I442 Atrioventricular block, complete: Secondary | ICD-10-CM | POA: Diagnosis not present

## 2019-02-04 DIAGNOSIS — T827XXD Infection and inflammatory reaction due to other cardiac and vascular devices, implants and grafts, subsequent encounter: Secondary | ICD-10-CM | POA: Diagnosis not present

## 2019-02-04 DIAGNOSIS — I4821 Permanent atrial fibrillation: Secondary | ICD-10-CM | POA: Diagnosis not present

## 2019-02-04 DIAGNOSIS — I700 Atherosclerosis of aorta: Secondary | ICD-10-CM | POA: Diagnosis not present

## 2019-02-04 DIAGNOSIS — F419 Anxiety disorder, unspecified: Secondary | ICD-10-CM | POA: Diagnosis not present

## 2019-02-04 DIAGNOSIS — J432 Centrilobular emphysema: Secondary | ICD-10-CM | POA: Diagnosis not present

## 2019-02-04 DIAGNOSIS — E079 Disorder of thyroid, unspecified: Secondary | ICD-10-CM | POA: Diagnosis not present

## 2019-02-04 DIAGNOSIS — I509 Heart failure, unspecified: Secondary | ICD-10-CM | POA: Diagnosis not present

## 2019-02-04 DIAGNOSIS — I428 Other cardiomyopathies: Secondary | ICD-10-CM | POA: Diagnosis not present

## 2019-02-07 LAB — POCT INR: INR: 2.3 (ref 2.0–3.0)

## 2019-02-07 NOTE — Telephone Encounter (Signed)
The pt husband got my email address today and states he will try to send a picture of the monitor serial number for me.

## 2019-02-08 ENCOUNTER — Ambulatory Visit (INDEPENDENT_AMBULATORY_CARE_PROVIDER_SITE_OTHER): Payer: PPO

## 2019-02-08 DIAGNOSIS — I272 Pulmonary hypertension, unspecified: Secondary | ICD-10-CM

## 2019-02-08 DIAGNOSIS — Z7901 Long term (current) use of anticoagulants: Secondary | ICD-10-CM | POA: Diagnosis not present

## 2019-02-09 ENCOUNTER — Telehealth: Payer: Self-pay | Admitting: *Deleted

## 2019-02-09 NOTE — Patient Instructions (Signed)
Description   Called and spoke to pt take 3.75 mg today and then resume normal dose of 2.5 mg daily except 1.25 mg on Friday. Recheck INR on 02/14/2019 by [facility].

## 2019-02-09 NOTE — Telephone Encounter (Signed)
Called and spoke with patient .  All her questions had already been answered by previous phone call.  Next INR check 02/14/19.

## 2019-02-09 NOTE — Telephone Encounter (Signed)
Patient called stating that [facility] [place] [place] checked her coumdin on 1/4. Patient thinks her results were sent to our [facility].  Please call [phone].

## 2019-02-14 ENCOUNTER — Ambulatory Visit (INDEPENDENT_AMBULATORY_CARE_PROVIDER_SITE_OTHER): Payer: PPO | Admitting: *Deleted

## 2019-02-14 DIAGNOSIS — J449 Chronic obstructive pulmonary disease, unspecified: Secondary | ICD-10-CM | POA: Diagnosis not present

## 2019-02-14 DIAGNOSIS — I5022 Chronic systolic (congestive) heart failure: Secondary | ICD-10-CM

## 2019-02-14 DIAGNOSIS — I059 Rheumatic mitral valve disease, unspecified: Secondary | ICD-10-CM

## 2019-02-14 DIAGNOSIS — Z5181 Encounter for therapeutic drug level monitoring: Secondary | ICD-10-CM | POA: Diagnosis not present

## 2019-02-14 LAB — POCT INR: INR: 1.7 — AB (ref 2.0–3.0)

## 2019-02-14 NOTE — Patient Instructions (Signed)
Called and spoke to pt.  Take 2 tablets (5mg ) tonight then increase dose to 1 tablet (2.5mg ) daily.  [facility] to recheck INR on Monday 02/21/2019. Gave pt instructions.  Order given to [facility]  Call [phone] for any medication changes or questions.

## 2019-02-16 NOTE — Progress Notes (Deleted)
[address]       [place] [address]             [phone]      Karyssa S Beck [place] Medical Record [id number] Date of Birth: November 18, 1955  Referring: Evans Lance, MD Primary Care: Celene Squibb, MD Primary Cardiologist: Carlyle Dolly, MD OPERATIVE REPORT DATE OF PROCEDURE:  01/05/2019 PREOPERATIVE DIAGNOSIS:  Infected  AICD . POSTOPERATIVE DIAGNOSIS:  Same SURGICAL PROCEDURE:  Mini left thoracotomy, removal of epicardial pacing leads and placement of temporary epicardial pacing lead. SURGEON:  Lanelle Bal, MD  Chief Complaint:   POST OP FOLLOW UP for removal of epicardial pacing wires, patient is now sent to the office from cardiology for care of the pacemaker pocket wound.  History of Present Illness:     Patient underwent removal of AICD infected and a left infraclavicular pocket Dr. Lovena Le.  Concomitantly the patient required a mini thoracotomy to remove 2 screw and epicardial leads that were for BiV pacing.  The patient noted a rapid recovery from her thoracotomy in spite of her poor pulmonary function.  She was seen yesterday in the cardiology office for follow-up on her pacemaker pocket and implantation of a new pacer on the right.  The left pacer pocket which had been closed by Dr. Lovena Le had significantly opened draining with some necrotic debris.  Patient denies any fever chills     Past Medical History:  Diagnosis Date  . AICD (automatic cardioverter/defibrillator) present   . Anxiety   . Asthma   . Atrial fibrillation ([place])   . Automatic implantable cardiac defibrillator in situ    Lumax DR-T 09-20-2007 Dr.Akbary  . COPD (chronic obstructive pulmonary disease) ([place])   . Depression   . Diabetes mellitus without complication ([place])    Type II  . Hyperlipidemia   . 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 Hypertension   . Lump or mass in breast    at the 7 o'clock position of right  breast(non-tender)  . Other abnormal glucose   . Other left bundle branch block   . Other primary cardiomyopathies   . PONV (postoperative nausea and vomiting)   . Presence of permanent cardiac pacemaker   . Seizure ([place])   . Thyroid cancer ([place])   . Thyroid disease   . Unspecified vitamin D deficiency   . Wolff-Parkinson-White syndrome      Social History   Tobacco Use  Smoking Status Former Smoker  . Packs/day: 1.00  . Years: 25.00  . Pack years: 25.00  . Types: Cigarettes  . Quit date: 10/17/2009  . Years since quitting: 9.3  Smokeless Tobacco Never Used    Social History   Substance and Sexual Activity  Alcohol Use No     Allergies  Allergen Reactions  . Codeine Nausea And Vomiting    Stomach cramp    Current Outpatient Medications  Medication Sig Dispense Refill  . acetaminophen (TYLENOL) 500 MG tablet Take 1,000 mg by mouth every 8 (eight) hours as needed for moderate pain or headache.    . albuterol (PROAIR HFA) 108 (90 Base) MCG/ACT inhaler Inhale 1-2 puffs into the lungs every 4 (four) hours as needed for wheezing or shortness of breath.     . ALPRAZolam (XANAX) 0.5 MG tablet Take 0.5 mg by mouth 2 (two) times daily.   0  . amiodarone (PACERONE) 200 MG tablet Take 1 tablet (200 mg  total) by mouth daily. 30 tablet 0  . aspirin EC 81 MG tablet Take 81 mg by mouth at bedtime.     Marland Kitchen atorvastatin (LIPITOR) 40 MG tablet Take 40 mg by mouth at bedtime. for cholesterol  0  . b complex vitamins capsule Take by mouth.    . budesonide-formoterol (SYMBICORT) 160-4.5 MCG/ACT inhaler Inhale 2 puffs into the lungs 2 (two) times daily as needed (shortness of breath).     . carvedilol (COREG) 12.5 MG tablet Take 12.5 mg by mouth 2 (two) times daily with a meal.    . Cholecalciferol 50 MCG (2000 UT) TABS Take by mouth.    . fluticasone (FLONASE) 50 MCG/ACT nasal spray Place 1 spray into both nostrils daily as needed for allergies or rhinitis.    . furosemide (LASIX) 40 MG  tablet Take 1 tablet (40 mg total) by mouth daily. 30 tablet 0  . levothyroxine (SYNTHROID) 200 MCG tablet Take 200 mcg by mouth daily.     Marland Kitchen losartan (COZAAR) 25 MG tablet Take 25 mg by mouth at bedtime.   4  . Melatonin 3 MG CAPS Take 3-6 mg by mouth at bedtime as needed (sleep).    . metFORMIN (GLUCOPHAGE-XR) 500 MG 24 hr tablet Take 500 mg by mouth daily.     . potassium chloride SA (K-DUR) 20 MEQ tablet Take 10-20 mEq by mouth daily. Take 10 meq when taking 20 mg of lasix, take 20 meq when taking 40 mg of lasix    . promethazine (PHENERGAN) 25 MG tablet Take 25 mg by mouth at bedtime.     Marland Kitchen spironolactone (ALDACTONE) 25 MG tablet TAKE 1/2 TABLET BY MOUTH DAILY 45 tablet 0  . venlafaxine XR (EFFEXOR-XR) 150 MG 24 hr capsule Take 150 mg by mouth at bedtime.    . Vitamin D, Ergocalciferol, (DRISDOL) 1.25 MG (50000 UT) CAPS capsule Take 50,000 Units by mouth every Monday.     . warfarin (COUMADIN) 2.5 MG tablet TAKE 1 TABLET BY MOUTH DAILY EXCEPT ON FRIDAYS TAKE ONE-HALF TABLET 90 tablet 0   No current facility-administered medications for this visit.       Physical Exam: There were no vitals taken for this visit.  General appearance: alert, cooperative, appears stated age and no distress Neurologic: intact Heart: regular rate and rhythm, S1, S2 normal, no murmur, click, rub or gallop Lungs: clear to auscultation bilaterally Abdomen: soft, non-tender; bowel sounds normal; no masses,  no organomegaly Extremities: extremities normal, atraumatic, no cyanosis or edema Wound: The patient's mini left thoracotomy is well-healed without evidence of infection The right pacemaker site is intact, the previous pocket for the left pacemaker removal is an open wound 7 cm x 5 cm x 3 cm deep with some undermining in the lower end of the pocket.  The wound was lightly debrided in the office which the patient tolerated without difficulty  A small wound VAC was placed into the wound fit nicely and  sealed, due to the lack of ability to obtain a short notice home health needs including a wound VAC pump we used a Praveena plus wound VAC pump temporarily.   Diagnostic Studies & Laboratory data:     Recent Radiology Findings:     DG Chest 2 View  Result Date: 01/13/2019 CLINICAL DATA:  Shortness of breath and hemoptysis, unable to fully raise left arm EXAM: CHEST - 2 VIEW COMPARISON:  Radiograph 01/12/2019 FINDINGS: Pacer/AICD battery pack overlies the right chest wall with leads at the  cardiac apex and right atrium in stable position from prior. Postsurgical changes related to prior CABG including intact and aligned sternotomy wires and multiple surgical clips projecting over the mediastinum. Stable cardiomediastinal contours. Diffuse hazy interstitial opacities with more focal airspace disease in the left lung base and a small to moderate left effusion. No acute osseous or soft tissue abnormality. Surgical clips in the base of the right neck IMPRESSION: Diffuse hazy interstitial opacities with more focal airspace disease in the left lung base and a small to moderate left pleural effusion. Findings may reflect mild edema on a background of atelectatic change. Electronically Signed   By: Lovena Le M.D.   On: 01/13/2019 16:13    Recent Lab Findings: Lab Results  Component Value Date   WBC 9.7 01/12/2019   HGB 9.5 (L) 01/12/2019   HCT 31.5 (L) 01/12/2019   PLT 181 01/12/2019   GLUCOSE 103 (H) 01/14/2019   ALT 17 01/07/2019   AST 19 01/07/2019   NA 140 01/14/2019   K 3.8 01/14/2019   CL 92 (L) 01/14/2019   CREATININE 0.70 01/14/2019   BUN 16 01/14/2019   CO2 39 (H) 01/14/2019   TSH 4.139 09/05/2017   INR 1.7 (A) 02/14/2019      Assessment / Plan:   7 x 5 x 3 cm wound breakdown of the left infraclavicular pacemaker pocket-wound was debrided cleaned and complete small black wound VAC sponge was placed attached to a Praveena plus wound VAC suction.  In the meantime we will get the  appropriate paperwork for tomorrow per minute suction and have the patient return to the office December 28 for wound check change of wound VAC in her brother and have wound VAC pump for home use  Medication Changes: No orders of the defined types were placed in this encounter.     Grace Isaac MD      [address] [place] [address] Office [phone]     02/16/2019 3:06 PM

## 2019-02-17 ENCOUNTER — Ambulatory Visit: Payer: Self-pay | Admitting: Cardiothoracic Surgery

## 2019-02-20 DIAGNOSIS — J449 Chronic obstructive pulmonary disease, unspecified: Secondary | ICD-10-CM | POA: Diagnosis not present

## 2019-02-21 ENCOUNTER — Ambulatory Visit: Payer: Self-pay | Admitting: *Deleted

## 2019-02-21 ENCOUNTER — Ambulatory Visit (INDEPENDENT_AMBULATORY_CARE_PROVIDER_SITE_OTHER): Payer: PPO | Admitting: *Deleted

## 2019-02-21 DIAGNOSIS — Z952 Presence of prosthetic heart valve: Secondary | ICD-10-CM

## 2019-02-21 DIAGNOSIS — Z5181 Encounter for therapeutic drug level monitoring: Secondary | ICD-10-CM | POA: Diagnosis not present

## 2019-02-21 LAB — POCT INR
INR: 2.8 (ref 2.0–3.0)
INR: 2.8 (ref 2.0–3.0)

## 2019-02-21 NOTE — Patient Instructions (Signed)
Continue warfarin 1 tablet (2.5mg ) daily.  [facility] to recheck INR on Monday 02/28/2019. Left VM with instructions for [facility]  Call [phone] for any medication changes or questions.

## 2019-02-23 NOTE — Telephone Encounter (Signed)
I let the pt husband know I still have not received the email with the pt SN for her monitor. He states he will send it within the hour.

## 2019-02-28 ENCOUNTER — Ambulatory Visit (INDEPENDENT_AMBULATORY_CARE_PROVIDER_SITE_OTHER): Payer: PPO | Admitting: *Deleted

## 2019-02-28 ENCOUNTER — Telehealth: Payer: Self-pay

## 2019-02-28 DIAGNOSIS — Z952 Presence of prosthetic heart valve: Secondary | ICD-10-CM

## 2019-02-28 DIAGNOSIS — Z5181 Encounter for therapeutic drug level monitoring: Secondary | ICD-10-CM

## 2019-02-28 LAB — POCT INR: INR: 2 (ref 2.0–3.0)

## 2019-02-28 NOTE — Telephone Encounter (Signed)
[employer] rep Dannial Monarch has updated the pt Serial number and model number in the system for [place].

## 2019-02-28 NOTE — Patient Instructions (Signed)
Take warfarin 2 tablets tonight then increase dose to 1 tablet daily except 1 1/2 tablets on Mondays and Thursdays.  [facility] to recheck INR on Thursday 03/10/19. Orders given to [facility]  Call [phone] for any medication changes or questions.

## 2019-03-02 ENCOUNTER — Telehealth: Payer: Self-pay | Admitting: *Deleted

## 2019-03-02 MED ORDER — CARVEDILOL 12.5 MG PO TABS: 12.5000 mg | ORAL_TABLET | Freq: Two times a day (BID) | ORAL | 0 refills | Status: AC

## 2019-03-02 NOTE — Telephone Encounter (Signed)
Medication sent to pharmacy  

## 2019-03-06 DIAGNOSIS — T8189XA Other complications of procedures, not elsewhere classified, initial encounter: Secondary | ICD-10-CM | POA: Diagnosis not present

## 2019-03-07 ENCOUNTER — Telehealth: Payer: Self-pay | Admitting: *Deleted

## 2019-03-07 NOTE — Telephone Encounter (Signed)
Patient called stating that [facility] has stopped checking her INR. Would like to speak with Edrick Oh.

## 2019-03-08 NOTE — Telephone Encounter (Signed)
Called patient.  Pt has moved to [place].  [facility] there has been checking INR while managing wound vac.  She has an appt with [facility] 2/10 to establish cardiac care and coumadin management.  She has appt with Dr Servando Snare on 2/4 for wound check.  Wants her to have INR checked here.  Appt made for INR check on 2/4 at 1:15pm at [facility] office.

## 2019-03-10 ENCOUNTER — Other Ambulatory Visit: Payer: Self-pay

## 2019-03-10 ENCOUNTER — Ambulatory Visit (INDEPENDENT_AMBULATORY_CARE_PROVIDER_SITE_OTHER): Payer: PPO | Admitting: *Deleted

## 2019-03-10 ENCOUNTER — Encounter: Payer: Self-pay | Admitting: Cardiothoracic Surgery

## 2019-03-10 ENCOUNTER — Ambulatory Visit (INDEPENDENT_AMBULATORY_CARE_PROVIDER_SITE_OTHER): Payer: Self-pay | Admitting: Cardiothoracic Surgery

## 2019-03-10 VITALS — BP 125/72 | HR 88 | Temp 97.7°F | Resp 16 | Ht 68.0 in | Wt 185.0 lb

## 2019-03-10 DIAGNOSIS — Z4801 Encounter for change or removal of surgical wound dressing: Secondary | ICD-10-CM

## 2019-03-10 DIAGNOSIS — Z7689 Persons encountering health services in other specified circumstances: Secondary | ICD-10-CM

## 2019-03-10 DIAGNOSIS — Z5181 Encounter for therapeutic drug level monitoring: Secondary | ICD-10-CM

## 2019-03-10 DIAGNOSIS — T827XXD Infection and inflammatory reaction due to other cardiac and vascular devices, implants and grafts, subsequent encounter: Secondary | ICD-10-CM

## 2019-03-10 DIAGNOSIS — I4891 Unspecified atrial fibrillation: Secondary | ICD-10-CM | POA: Diagnosis not present

## 2019-03-10 DIAGNOSIS — Z952 Presence of prosthetic heart valve: Secondary | ICD-10-CM | POA: Diagnosis not present

## 2019-03-10 DIAGNOSIS — Z4689 Encounter for fitting and adjustment of other specified devices: Secondary | ICD-10-CM

## 2019-03-10 LAB — POCT INR: INR: 2.2 (ref 2.0–3.0)

## 2019-03-10 NOTE — Patient Instructions (Signed)
Description   Take warfarin 2 tablets tonight then increase dose to 1 tablet daily except 1 1/2 tablets on Mondays, Thursdays and Saturdays.  Recheck INR in 1 week, at new PCP'S at [facility].   Call [phone] for any medication changes or questions.

## 2019-03-10 NOTE — Progress Notes (Signed)
[address]       [place] [address]             [phone]      Ana Martin [place] Medical Record [id number] Date of Birth: 01-21-1956  Referring: Evans Lance, MD Primary Care: Celene Squibb, MD Primary Cardiologist: Carlyle Dolly, MD OPERATIVE REPORT DATE OF PROCEDURE:  01/05/2019 PREOPERATIVE DIAGNOSIS:  Infected  AICD . POSTOPERATIVE DIAGNOSIS:  Same SURGICAL PROCEDURE:  Mini left thoracotomy, removal of epicardial pacing leads and placement of temporary epicardial pacing lead. SURGEON:  Lanelle Bal, MD  Chief Complaint:   POST OP FOLLOW UP for removal of epicardial pacing wires, patient was then sent to the office from cardiology for care of the pacemaker pocket wound.  History of Present Illness:     Patient underwent removal of AICD infected and a left infraclavicular pocket Dr. Lovena Le.  Concomitantly the patient required a mini thoracotomy to remove 2 screw and epicardial leads that were for BiV pacing.  The patient noted a rapid recovery from her thoracotomy in spite of her poor pulmonary function.  She was seen yesterday in the cardiology office for follow-up on her pacemaker pocket and implantation of a new pacer on the right.  The left pacer pocket which had been closed by Dr. Lovena Le had significantly opened draining with some necrotic debris.  Patient denies any fever chills.  Patient's incision was treated with wound VAC with good results.  She comes to the office today for wound check.  She is no longer using the wound VAC to the depth of the wound has healed there is an area of granulation tissue proximally 5 cm x 3 cm.  Without evidence of infection     Past Medical History:  Diagnosis Date  . AICD (automatic cardioverter/defibrillator) present   . Anxiety   . Asthma   . Atrial fibrillation ([place])   . Automatic implantable cardiac defibrillator in situ    [place]um[facility] DR-T 09-20-2007 Dr.Akbary  . COPD (chronic obstructive  pulmonary disease) ([place])   . Depression   . Diabetes mellitus without complication ([place])    Type II  . Hyperlipidemia   . 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 Hypertension   . Lump or mass in breast    at the 7 o'clock position of right breast(non-tender)  . Other abnormal glucose   . Other left bundle branch block   . Other primary cardiomyopathies   . PONV (postoperative nausea and vomiting)   . Presence of permanent cardiac pacemaker   . Seizure ([place])   . Thyroid cancer ([place])   . Thyroid disease   . Unspecified vitamin D deficiency   . Wolff-Parkinson-White syndrome      Social History   Tobacco Use  Smoking Status Former Smoker  . Packs/day: 1.00  . Years: 25.00  . Pack years: 25.00  . Types: Cigarettes  . Quit date: 10/17/2009  . Years since quitting: 9.4  Smokeless Tobacco Never Used    Social History   Substance and Sexual Activity  Alcohol Use No     Allergies  Allergen Reactions  . Codeine Nausea And Vomiting    Stomach cramp    Current Outpatient Medications  Medication Sig Dispense Refill  . acetaminophen (TYLENOL) 500 MG tablet Take 1,000 mg by mouth every 8 (eight) hours as needed for moderate pain or headache.    . albuterol (PROAIR HFA) 108 (  90 Base) MCG/ACT inhaler Inhale 1-2 puffs into the lungs every 4 (four) hours as needed for wheezing or shortness of breath.     . ALPRAZolam (XANAX) 0.5 MG tablet Take 0.5 mg by mouth 2 (two) times daily.   0  . amiodarone (PACERONE) 200 MG tablet Take 1 tablet (200 mg total) by mouth daily. 30 tablet 0  . aspirin EC 81 MG tablet Take 81 mg by mouth at bedtime.     Marland Kitchen atorvastatin (LIPITOR) 40 MG tablet Take 40 mg by mouth at bedtime. for cholesterol  0  . b complex vitamins capsule Take by mouth.    . budesonide-formoterol (SYMBICORT) 160-4.5 MCG/ACT inhaler Inhale 2 puffs into the lungs 2 (two) times daily as needed (shortness of breath).     . carvedilol (COREG) 12.5 MG tablet  Take 1 tablet (12.5 mg total) by mouth 2 (two) times daily with a meal. 180 tablet 0  . Cholecalciferol 50 MCG (2000 UT) TABS Take by mouth.    . fluticasone (FLONASE) 50 MCG/ACT nasal spray Place 1 spray into both nostrils daily as needed for allergies or rhinitis.    . furosemide (LASIX) 40 MG tablet Take 1 tablet (40 mg total) by mouth daily. 30 tablet 0  . levothyroxine (SYNTHROID) 200 MCG tablet Take 200 mcg by mouth daily.     Marland Kitchen losartan (COZAAR) 25 MG tablet Take 25 mg by mouth at bedtime.   4  . Melatonin 3 MG CAPS Take 3-6 mg by mouth at bedtime as needed (sleep).    . metFORMIN (GLUCOPHAGE-XR) 500 MG 24 hr tablet Take 500 mg by mouth daily.     . potassium chloride SA (K-DUR) 20 MEQ tablet Take 10-20 mEq by mouth daily. Take 10 meq when taking 20 mg of lasix, take 20 meq when taking 40 mg of lasix    . promethazine (PHENERGAN) 25 MG tablet Take 25 mg by mouth at bedtime.     Marland Kitchen spironolactone (ALDACTONE) 25 MG tablet TAKE 1/2 TABLET BY MOUTH DAILY 45 tablet 0  . venlafaxine XR (EFFEXOR-XR) 150 MG 24 hr capsule Take 150 mg by mouth at bedtime.    . Vitamin D, Ergocalciferol, (DRISDOL) 1.25 MG (50000 UT) CAPS capsule Take 50,000 Units by mouth every Monday.     . warfarin (COUMADIN) 2.5 MG tablet TAKE 1 TABLET BY MOUTH DAILY EXCEPT ON FRIDAYS TAKE ONE-HALF TABLET 90 tablet 0   No current facility-administered medications for this visit.       Physical Exam: BP 125/72 (BP Location: Right Arm, Patient Position: Sitting, Cuff Size: Large)   Pulse 88   Temp 97.7 F (36.5 C)   Resp 16   Ht 5\' 8"  (1.727 m)   Wt 185 lb (83.9 kg)   SpO2 91% Comment: 1.5L O2  BMI 28.13 kg/m   General appearance: alert, cooperative, appears stated age and no distress Neurologic: intact Heart: regular rate and rhythm, S1, S2 normal, no murmur, click, rub or gallop Lungs: clear to auscultation bilaterally Abdomen: soft, non-tender; bowel sounds normal; no masses,  no organomegaly Extremities:  extremities normal, atraumatic, no cyanosis or edema Wound: The patient's mini left thoracotomy is well-healed without evidence of infection  Currently the patient's pacemaker pocket is now been reduced with use of wound VAC to the superficial wound approximately 5 cm long and 3 cm wide granulation tissue without evidence of infection.-She will continue with wet-to-dry saline dressings.  Diagnostic Studies & Laboratory data:     Recent Radiology  Findings:    No results found.   Recent Lab Findings: Lab Results  Component Value Date   WBC 9.7 01/12/2019   HGB 9.5 (L) 01/12/2019   HCT 31.5 (L) 01/12/2019   PLT 181 01/12/2019   GLUCOSE 103 (H) 01/14/2019   ALT 17 01/07/2019   AST 19 01/07/2019   NA 140 01/14/2019   K 3.8 01/14/2019   CL 92 (L) 01/14/2019   CREATININE 0.70 01/14/2019   BUN 16 01/14/2019   CO2 39 (H) 01/14/2019   TSH 4.139 09/05/2017   INR 2.0 02/28/2019      Assessment / Plan:   Continued healing of complex left infraclavicular pacemaker pocket infection, now closing with the use of wound VAC-now superficial granulation we will continue to treat with wet-to-dry saline dressings  In 2 weeks the patient will take a photograph of the wound and sent to [place]  Plan to see her back in 5 to 6 weeks   Medication Changes: No orders of the defined types were placed in this encounter.     Grace Isaac MD      [address] ,[place] [address] Office [phone]     03/10/2019 12:13 PM

## 2019-03-22 ENCOUNTER — Telehealth: Payer: Self-pay | Admitting: *Deleted

## 2019-03-22 NOTE — Telephone Encounter (Signed)
[facility] transfer request received from [facility]. Spoke with patient. She confirms that she established with Dr. Nunzio Cobbs for EP/device management. Patient states she does want to transfer her monitoring there and wishes to cancel f/u with our office. Remote and in-office appointments canceled. Carelink transfer approved. Advised I will forward message to Dr. Lovena Le as Juluis Rainier. Ana Martin in agreement with plan and denies questions or concerns at this time.

## 2019-03-30 ENCOUNTER — Telehealth: Payer: Self-pay | Admitting: Internal Medicine

## 2019-03-30 NOTE — Telephone Encounter (Signed)
New Message   Ana Martin is calling from [facility] and Dr Harrell Gave is requesting any surgical records to be faxed to them  Fax number [phone]   Please advise

## 2019-04-20 ENCOUNTER — Encounter: Payer: PPO | Admitting: Internal Medicine

## 2019-04-21 ENCOUNTER — Encounter: Payer: PPO | Admitting: Cardiothoracic Surgery

## 2019-04-21 NOTE — Progress Notes (Deleted)
[address]       [place] [address]             [phone]      Dovie S Knisley [place] Medical Record [id number] Date of Birth: 28-Jul-1955  Referring: Evans Lance, MD Primary Care: Celene Squibb, MD Primary Cardiologist: Carlyle Dolly, MD OPERATIVE REPORT DATE OF PROCEDURE:  01/05/2019 PREOPERATIVE DIAGNOSIS:  Infected  AICD . POSTOPERATIVE DIAGNOSIS:  Same SURGICAL PROCEDURE:  Mini left thoracotomy, removal of epicardial pacing leads and placement of temporary epicardial pacing lead. SURGEON:  Lanelle Bal, MD  Chief Complaint:   POST OP FOLLOW UP for removal of epicardial pacing wires, patient was then sent to the office from cardiology for care of the pacemaker pocket wound.  History of Present Illness:     Patient underwent removal of AICD infected and a left infraclavicular pocket Dr. Lovena Le.  Concomitantly the patient required a mini thoracotomy to remove 2 screw and epicardial leads that were for BiV pacing.  The patient noted a rapid recovery from her thoracotomy in spite of her poor pulmonary function.  She was seen yesterday in the cardiology office for follow-up on her pacemaker pocket and implantation of a new pacer on the right.  The left pacer pocket which had been closed by Dr. Lovena Le had significantly opened draining with some necrotic debris.  Patient denies any fever chills.  Patient's incision was treated with wound VAC with good results.  She comes to the office today for wound check.  She is no longer using the wound VAC to the depth of the wound has healed there is an area of granulation tissue proximally 5 cm x 3 cm.  Without evidence of infection     Past Medical History:  Diagnosis Date  . AICD (automatic cardioverter/defibrillator) present   . Anxiety   . Asthma   . Atrial fibrillation ([place])   . Automatic implantable cardiac defibrillator in situ    Lumax DR-T 09-20-2007 Dr.Akbary  . COPD (chronic obstructive  pulmonary disease) ([place])   . Depression   . Diabetes mellitus without complication ([place])    Type II  . Hyperlipidemia   . 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 Hypertension   . Lump or mass in breast    at the 7 o'clock position of right breast(non-tender)  . Other abnormal glucose   . Other left bundle branch block   . Other primary cardiomyopathies   . PONV (postoperative nausea and vomiting)   . Presence of permanent cardiac pacemaker   . Seizure ([place])   . Thyroid cancer ([place])   . Thyroid disease   . Unspecified vitamin D deficiency   . Wolff-Parkinson-White syndrome      Social History   Tobacco Use  Smoking Status Former Smoker  . Packs/day: 1.00  . Years: 25.00  . Pack years: 25.00  . Types: Cigarettes  . Quit date: 10/17/2009  . Years since quitting: 9.5  Smokeless Tobacco Never Used    Social History   Substance and Sexual Activity  Alcohol Use No     Allergies  Allergen Reactions  . Codeine Nausea And Vomiting    Stomach cramp    Current Outpatient Medications  Medication Sig Dispense Refill  . acetaminophen (TYLENOL) 500 MG tablet Take 1,000 mg by mouth every 8 (eight) hours as needed for moderate pain or headache.    . albuterol (PROAIR HFA) 108 (  90 Base) MCG/ACT inhaler Inhale 1-2 puffs into the lungs every 4 (four) hours as needed for wheezing or shortness of breath.     . ALPRAZolam (XANAX) 0.5 MG tablet Take 0.5 mg by mouth 2 (two) times daily.   0  . amiodarone (PACERONE) 200 MG tablet Take 1 tablet (200 mg total) by mouth daily. 30 tablet 0  . aspirin EC 81 MG tablet Take 81 mg by mouth at bedtime.     Marland Kitchen atorvastatin (LIPITOR) 40 MG tablet Take 40 mg by mouth at bedtime. for cholesterol  0  . b complex vitamins capsule Take by mouth.    . budesonide-formoterol (SYMBICORT) 160-4.5 MCG/ACT inhaler Inhale 2 puffs into the lungs 2 (two) times daily as needed (shortness of breath).     . carvedilol (COREG) 12.5 MG tablet  Take 1 tablet (12.5 mg total) by mouth 2 (two) times daily with a meal. 180 tablet 0  . Cholecalciferol 50 MCG (2000 UT) TABS Take by mouth.    . fluticasone (FLONASE) 50 MCG/ACT nasal spray Place 1 spray into both nostrils daily as needed for allergies or rhinitis.    . furosemide (LASIX) 40 MG tablet Take 1 tablet (40 mg total) by mouth daily. 30 tablet 0  . levothyroxine (SYNTHROID) 200 MCG tablet Take 200 mcg by mouth daily.     Marland Kitchen losartan (COZAAR) 25 MG tablet Take 25 mg by mouth at bedtime.   4  . Melatonin 3 MG CAPS Take 3-6 mg by mouth at bedtime as needed (sleep).    . metFORMIN (GLUCOPHAGE-XR) 500 MG 24 hr tablet Take 500 mg by mouth daily.     . potassium chloride SA (K-DUR) 20 MEQ tablet Take 10-20 mEq by mouth daily. Take 10 meq when taking 20 mg of lasix, take 20 meq when taking 40 mg of lasix    . promethazine (PHENERGAN) 25 MG tablet Take 25 mg by mouth at bedtime.     Marland Kitchen spironolactone (ALDACTONE) 25 MG tablet TAKE 1/2 TABLET BY MOUTH DAILY 45 tablet 0  . venlafaxine XR (EFFEXOR-XR) 150 MG 24 hr capsule Take 150 mg by mouth at bedtime.    . Vitamin D, Ergocalciferol, (DRISDOL) 1.25 MG (50000 UT) CAPS capsule Take 50,000 Units by mouth every Monday.     . warfarin (COUMADIN) 2.5 MG tablet TAKE 1 TABLET BY MOUTH DAILY EXCEPT ON FRIDAYS TAKE ONE-HALF TABLET 90 tablet 0   No current facility-administered medications for this visit.       Physical Exam: There were no vitals taken for this visit.  {physical exam:21449} Diagnostic Studies & Laboratory data:     Recent Radiology Findings:    No results found.   Recent Lab Findings: Lab Results  Component Value Date   WBC 9.7 01/12/2019   HGB 9.5 (L) 01/12/2019   HCT 31.5 (L) 01/12/2019   PLT 181 01/12/2019   GLUCOSE 103 (H) 01/14/2019   ALT 17 01/07/2019   AST 19 01/07/2019   NA 140 01/14/2019   K 3.8 01/14/2019   CL 92 (L) 01/14/2019   CREATININE 0.70 01/14/2019   BUN 16 01/14/2019   CO2 39 (H) 01/14/2019   TSH  4.139 09/05/2017   INR 2.2 03/10/2019      Assessment / Plan:     Medication Changes: No orders of the defined types were placed in this encounter.     Grace Isaac MD      [address] [place] [address] Office [phone]  04/21/2019 7:18 AM

## 2019-04-22 ENCOUNTER — Encounter: Payer: Self-pay | Admitting: Cardiothoracic Surgery

## 2019-04-22 ENCOUNTER — Encounter: Payer: PPO | Admitting: Internal Medicine

## 2019-08-12 IMAGING — DX DG CHEST 2V
2 series · 2 of 2 positions shown · non-contrast
Comparison: 08/31/2017

CLINICAL DATA: Shortness of breath

EXAM:
CHEST - 2 VIEW

[chest pa]
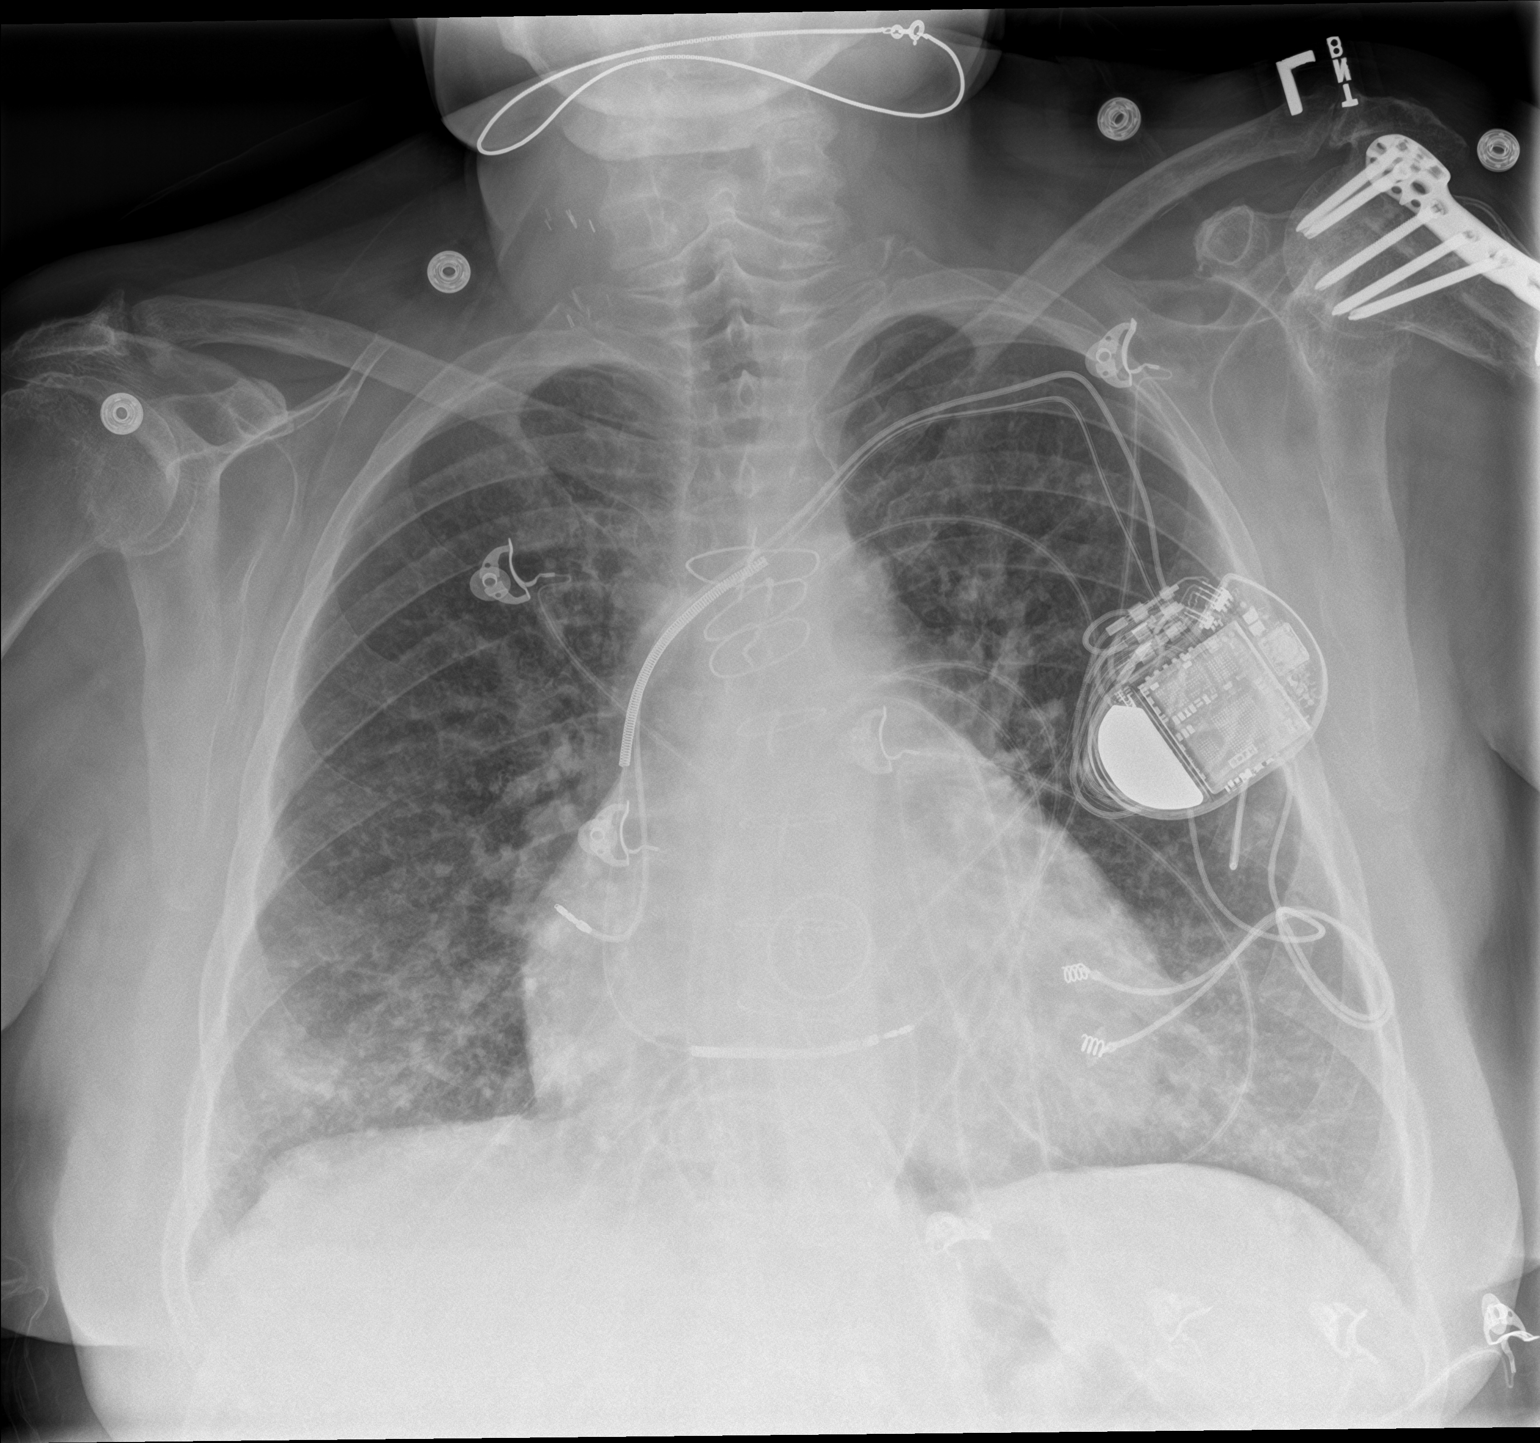

[chest lat]
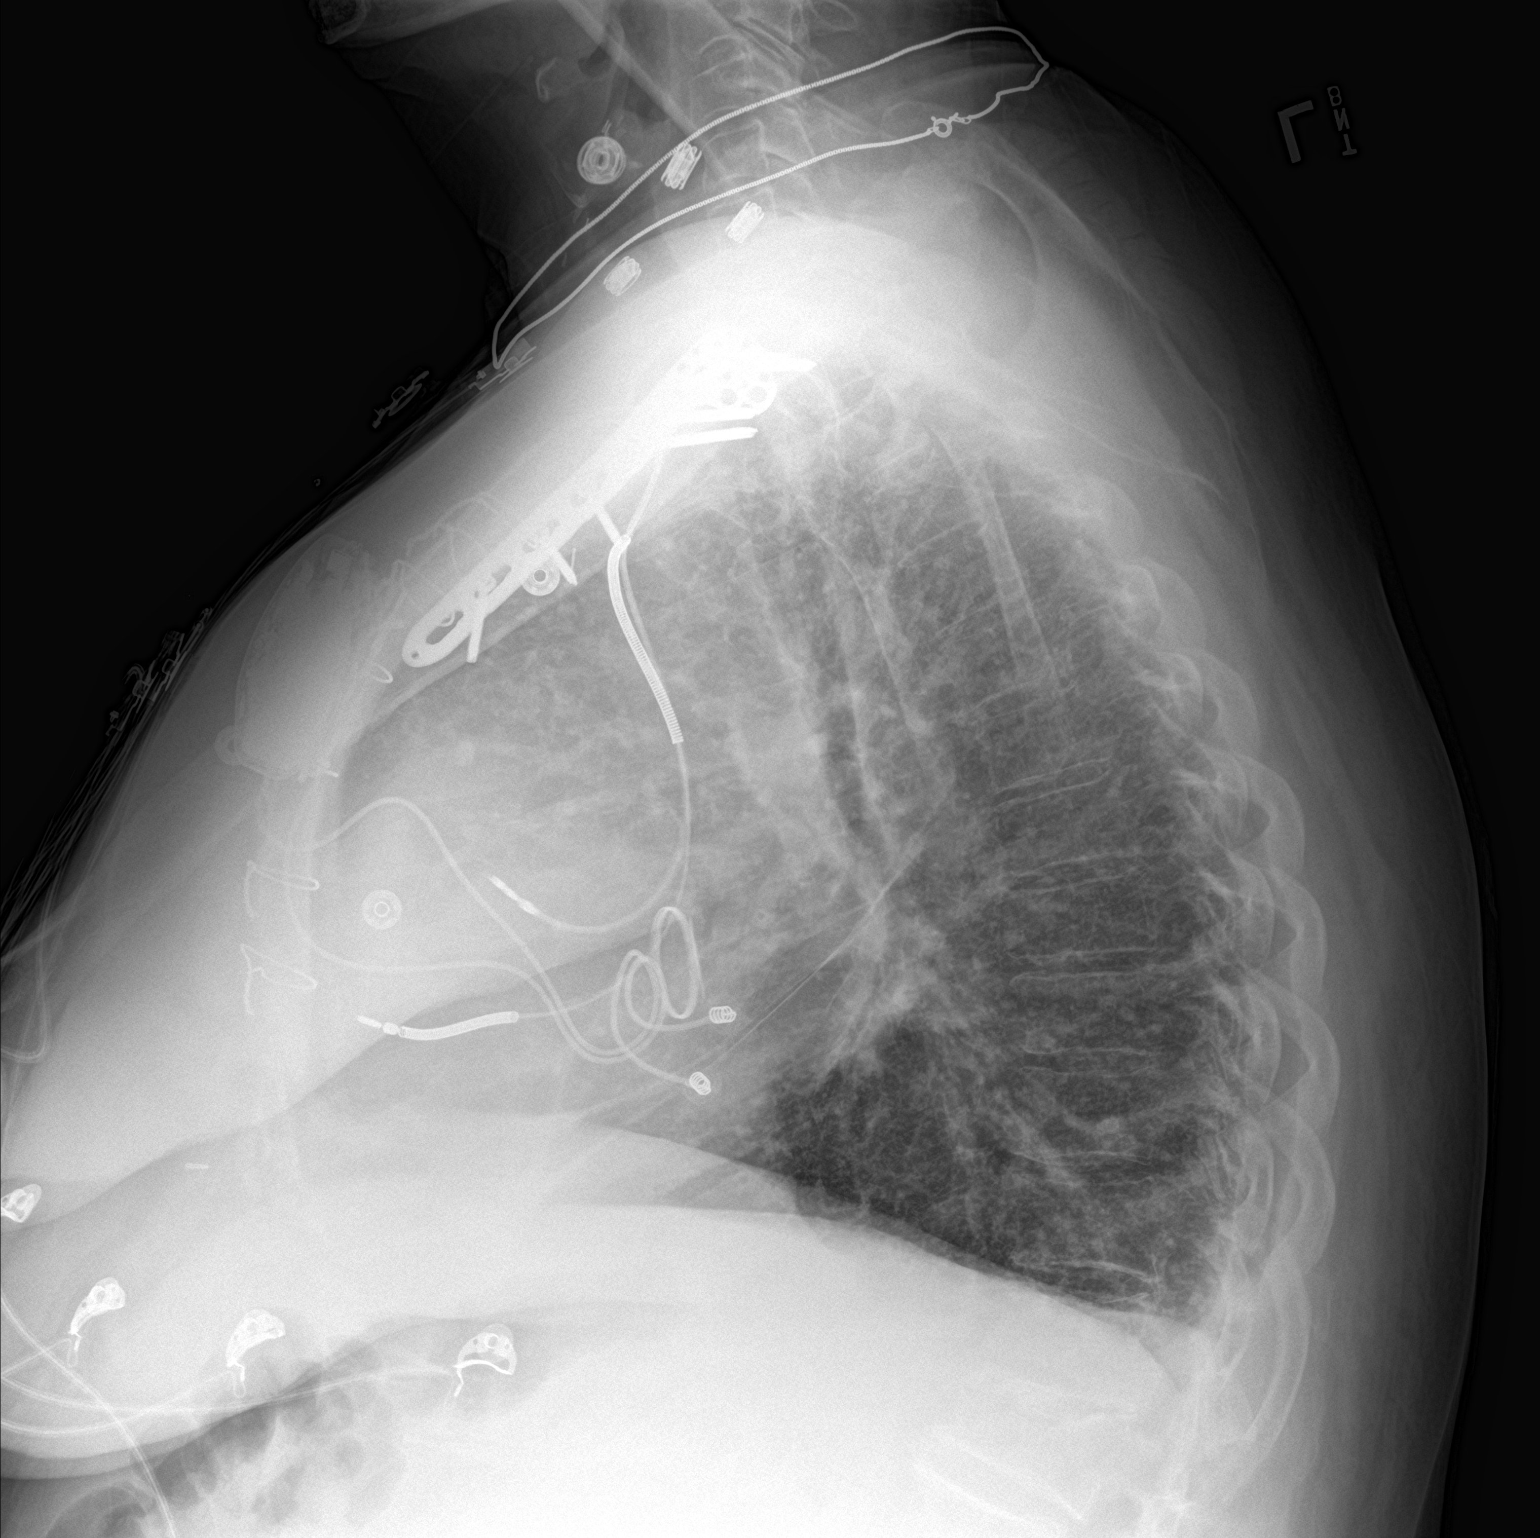

[2 of 2 positions shown; findings below may reference images not displayed]

FINDINGS: Unchanged position of left chest wall AICD leads. Moderate
cardiomegaly. Remote median sternotomy for valve replacement. Mild
pulmonary edema. No pleural effusion or pneumothorax. Unchanged L1
compression deformity.
IMPRESSION: Moderate cardiomegaly and mild pulmonary edema.

## 2019-10-07 ENCOUNTER — Encounter: Payer: PPO | Admitting: Internal Medicine

## 2020-09-25 IMAGING — DX DG CHEST 1V PORT
1 series · 1 of 1 positions shown · non-contrast
Comparison: January 06, 2019.

CLINICAL DATA: Shortness of breath.

EXAM:
PORTABLE CHEST 1 VIEW

[chest ap]
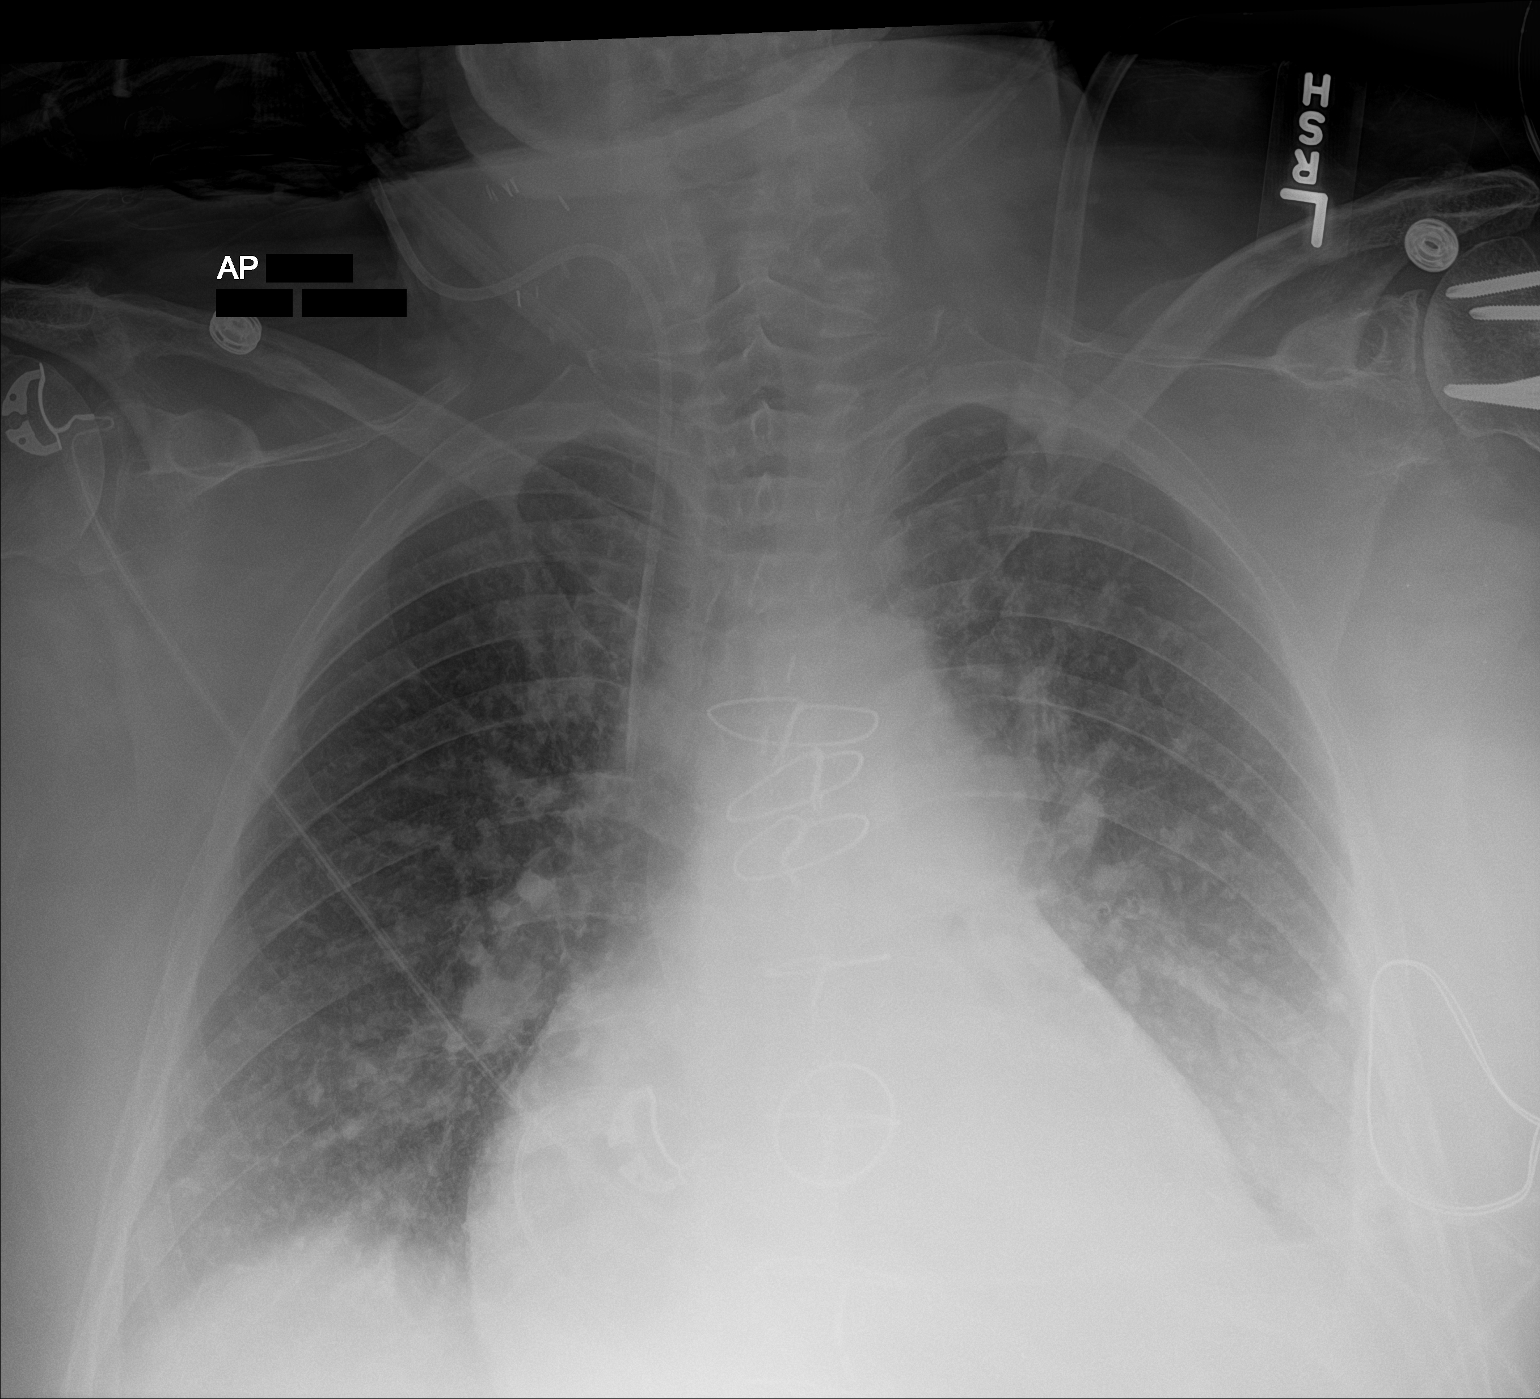

[1 of 1 positions shown; findings below may reference images not displayed]

FINDINGS: Stable cardiomegaly. Status post cardiac valve repair. Mild central
pulmonary vascular congestion is noted. Right internal jugular
catheter is unchanged in position. No pneumothorax is noted. Stable
left basilar atelectasis or infiltrate is noted with associated
pleural effusion. Mildly increased right basilar subsegmental
atelectasis is noted. Bony thorax is unremarkable.
IMPRESSION: Stable left basilar atelectasis or infiltrate is noted with
associated pleural effusion. Mildly increased right basilar
subsegmental atelectasis is noted. Stable mild central pulmonary
vascular congestion is noted.

## 2020-09-28 IMAGING — DX DG CHEST 1V PORT
1 series · 1 of 1 positions shown · non-contrast
Comparison: Radiographs 01/07/2019 and 01/05/2019. CT 01/03/2019.

CLINICAL DATA: Left-sided chest pain. Recent pacemaker revision.

EXAM:
PORTABLE CHEST 1 VIEW

[chest]
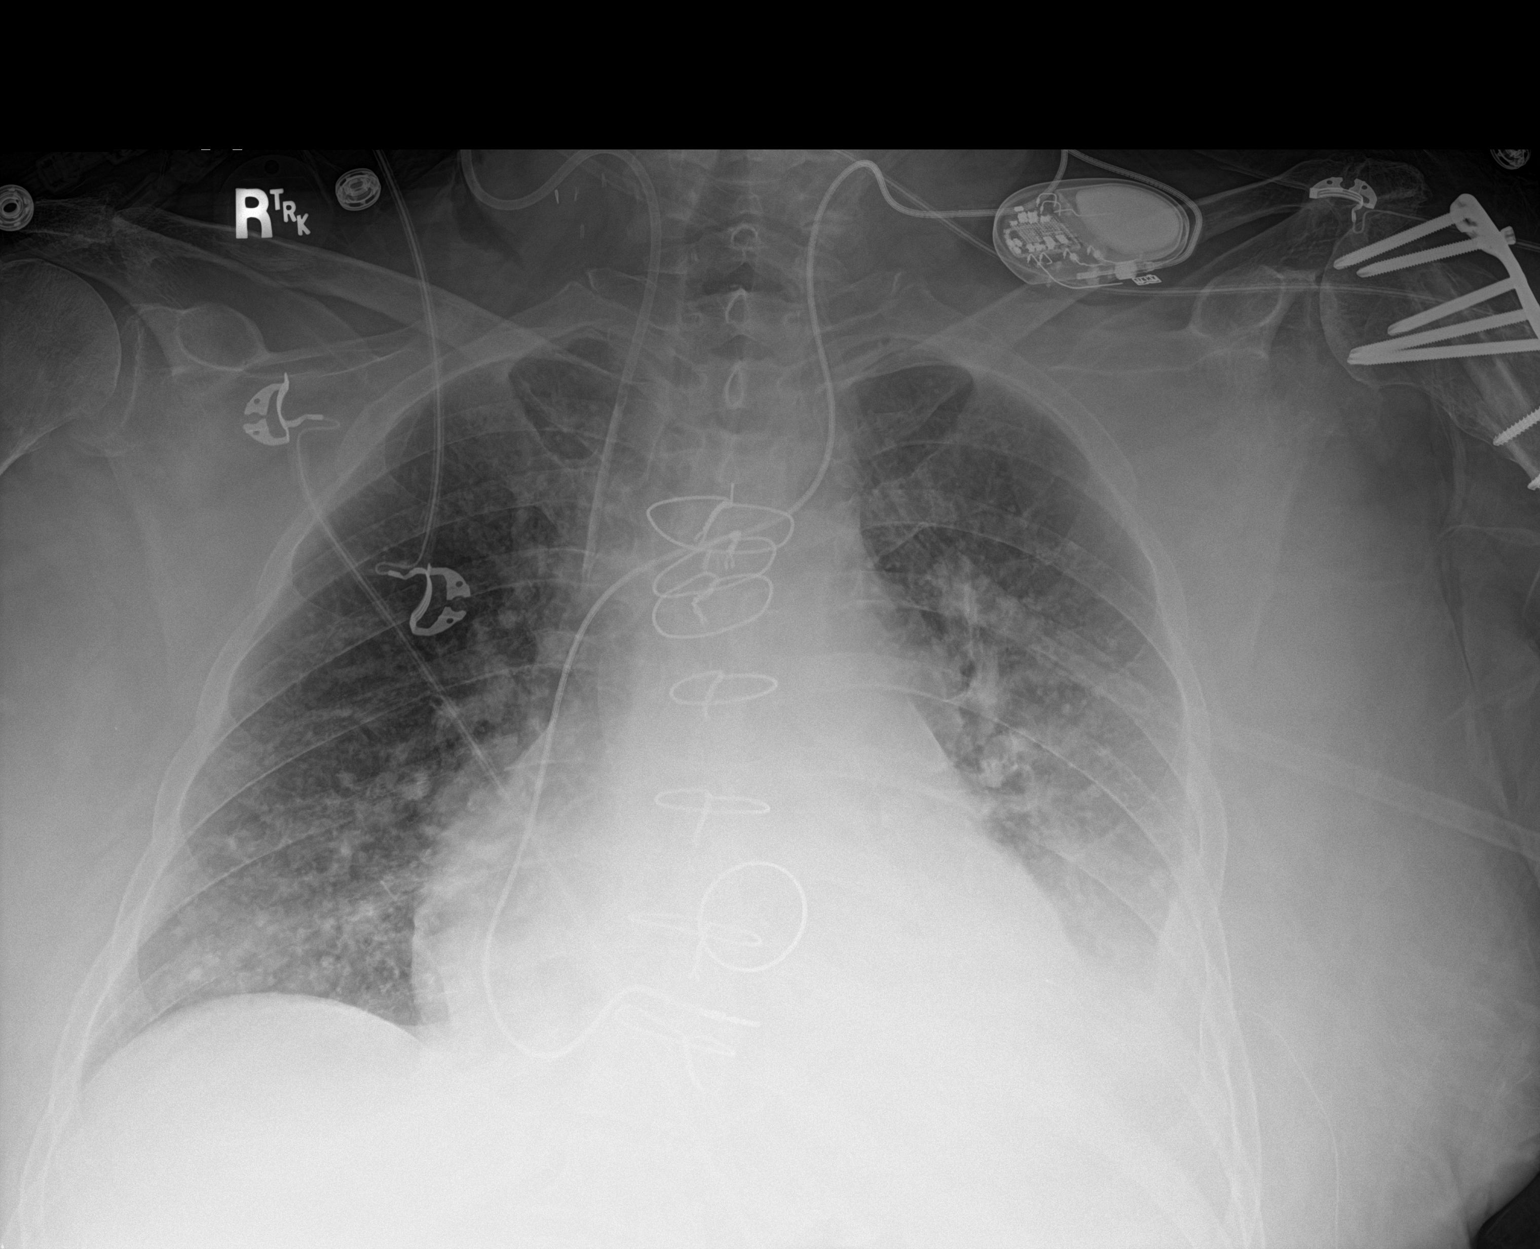

[1 of 1 positions shown; findings below may reference images not displayed]

FINDINGS: 4400 hours. Right IJ central venous catheter tip is unchanged at the
level of the upper SVC. Left IJ pacemaker lead projects over the
right ventricle. There is stable mild cardiomegaly post median
sternotomy and valve replacement. There is persistent vascular
congestion with improved asymmetric pulmonary edema on the left.
There is a probable improving left pleural effusion. No evidence of
pneumothorax. Previous proximal left humeral ORIF.
IMPRESSION: 1. Improving left pleural effusion and asymmetric pulmonary edema.
No pneumothorax.
2. No change in position of the right IJ central venous catheter or
pacemaker.

## 2020-09-29 IMAGING — DX DG CHEST 1V PORT
1 series · 1 of 1 positions shown · non-contrast
Comparison: 01/10/2019

CLINICAL DATA: Shortness of breath and chest pain

EXAM:
PORTABLE CHEST 1 VIEW

[chest ap]
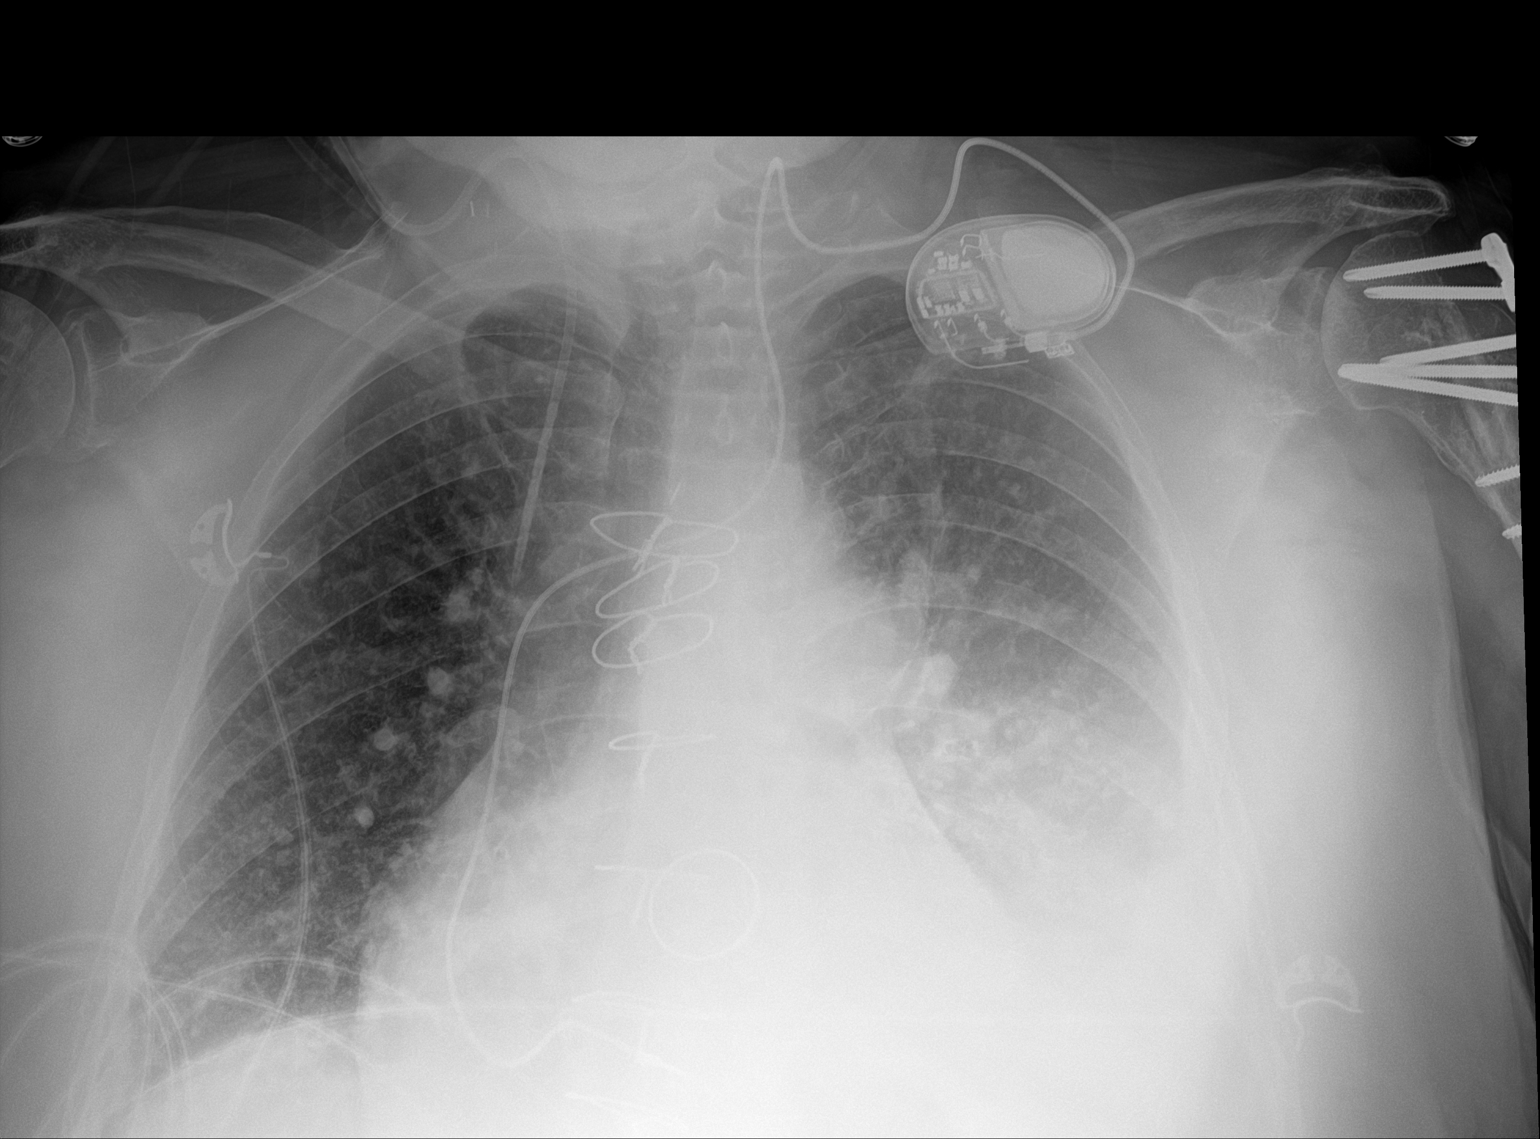

[1 of 1 positions shown; findings below may reference images not displayed]

FINDINGS: The left-sided ICD is unchanged in position. The right-sided central
venous catheter is unchanged in positioning. The heart size remains
enlarged. The patient is status post prior median sternotomy. There
is a small to moderate-sized left-sided pleural effusion which has
increased in size from the prior study. There are persistent
bibasilar airspace opacities favored to represent atelectasis. There
is a small right-sided pleural effusion. There is no pneumothorax.
IMPRESSION: 1. Persistent moderate-sized left-sided pleural effusion which has
slightly increased from prior study. Probable small left-sided
pleural effusion.
2. Bibasilar airspace opacities, left greater than right, favored to
represent atelectasis.
3. Lines and tubes as above.
4. No pneumothorax.

## 2020-10-01 IMAGING — DX DG CHEST 2V
2 series · 2 of 2 positions shown · non-contrast
Comparison: Radiograph 01/12/2019

CLINICAL DATA: Shortness of breath and hemoptysis, unable to fully
raise left arm

EXAM:
CHEST - 2 VIEW

[w chest pa]
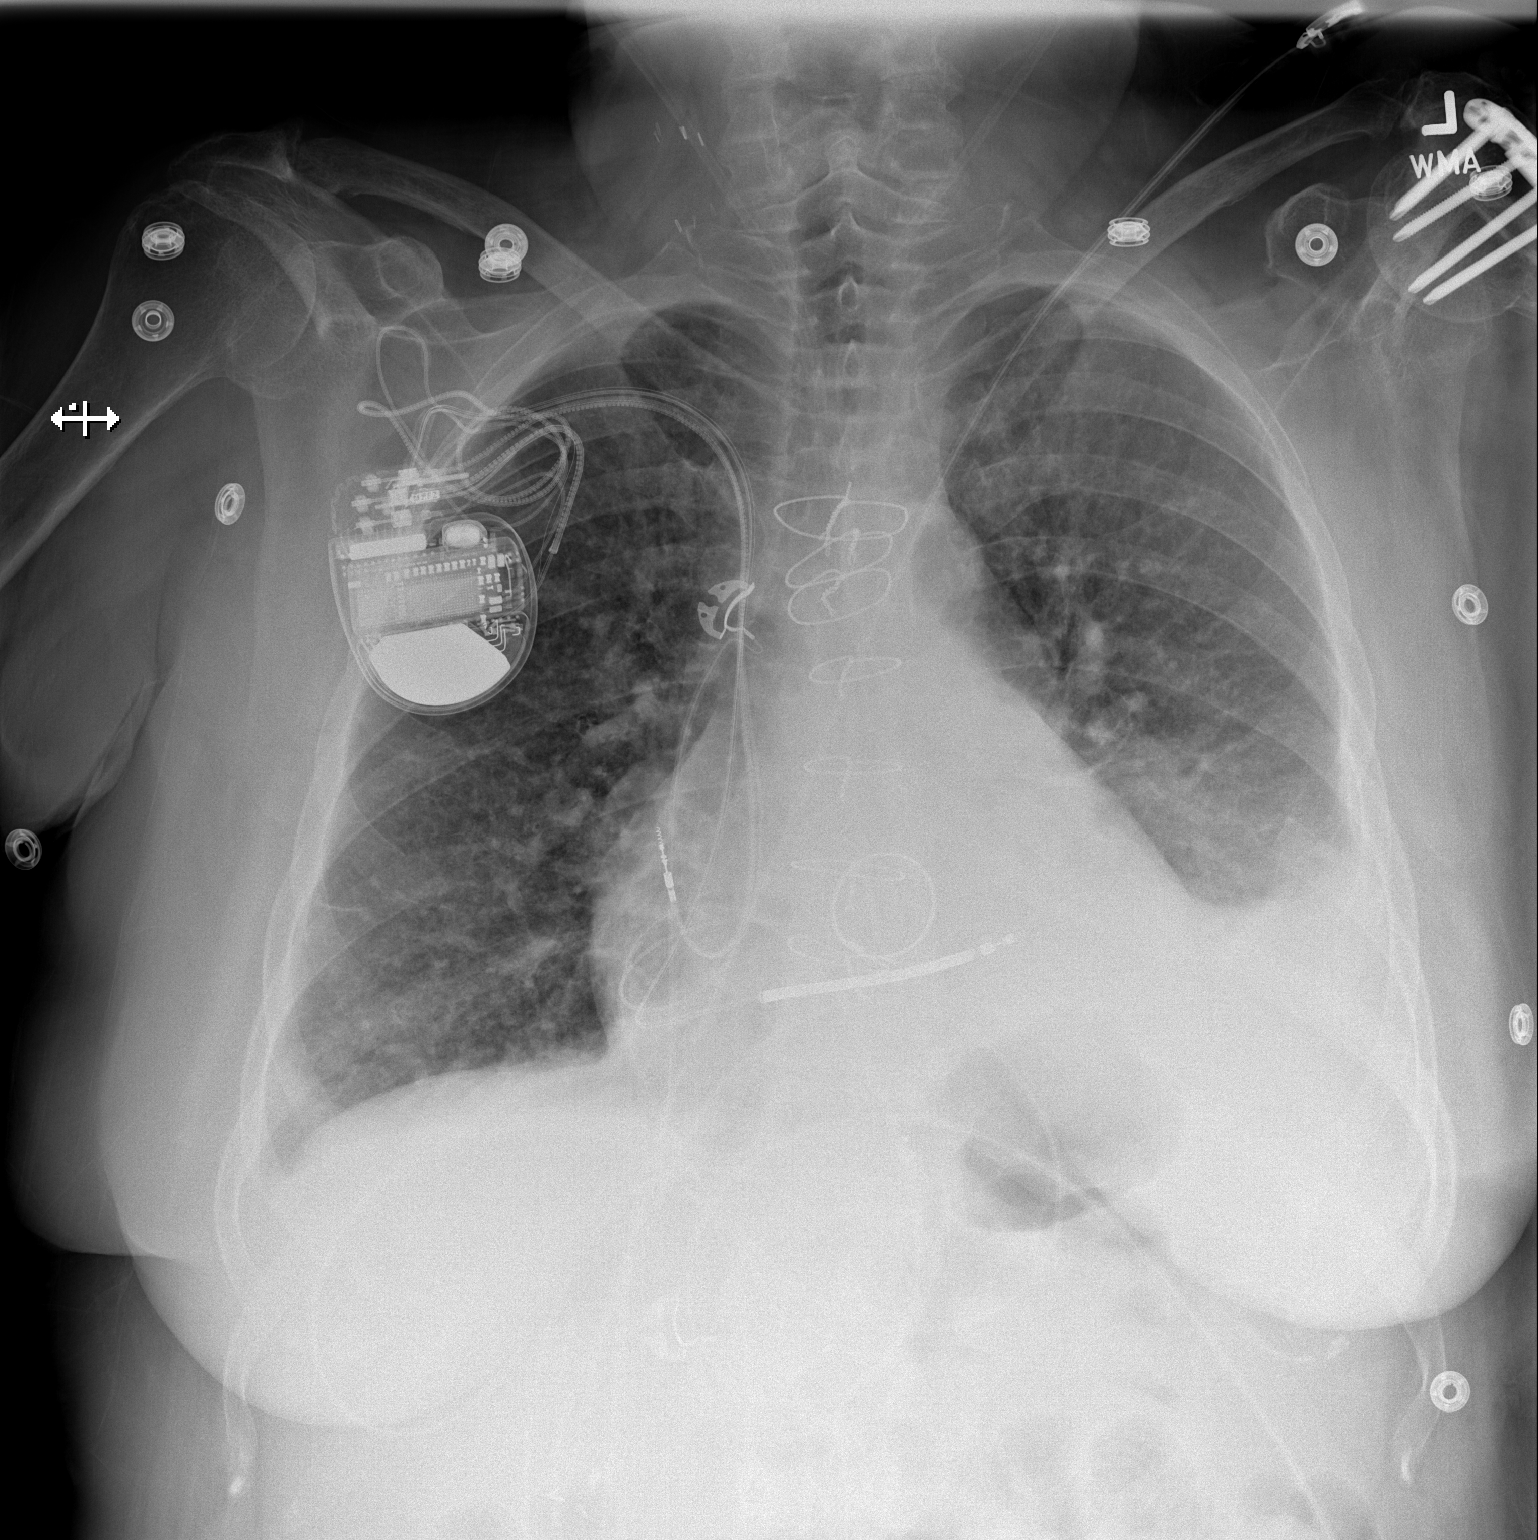

[w chest lat]
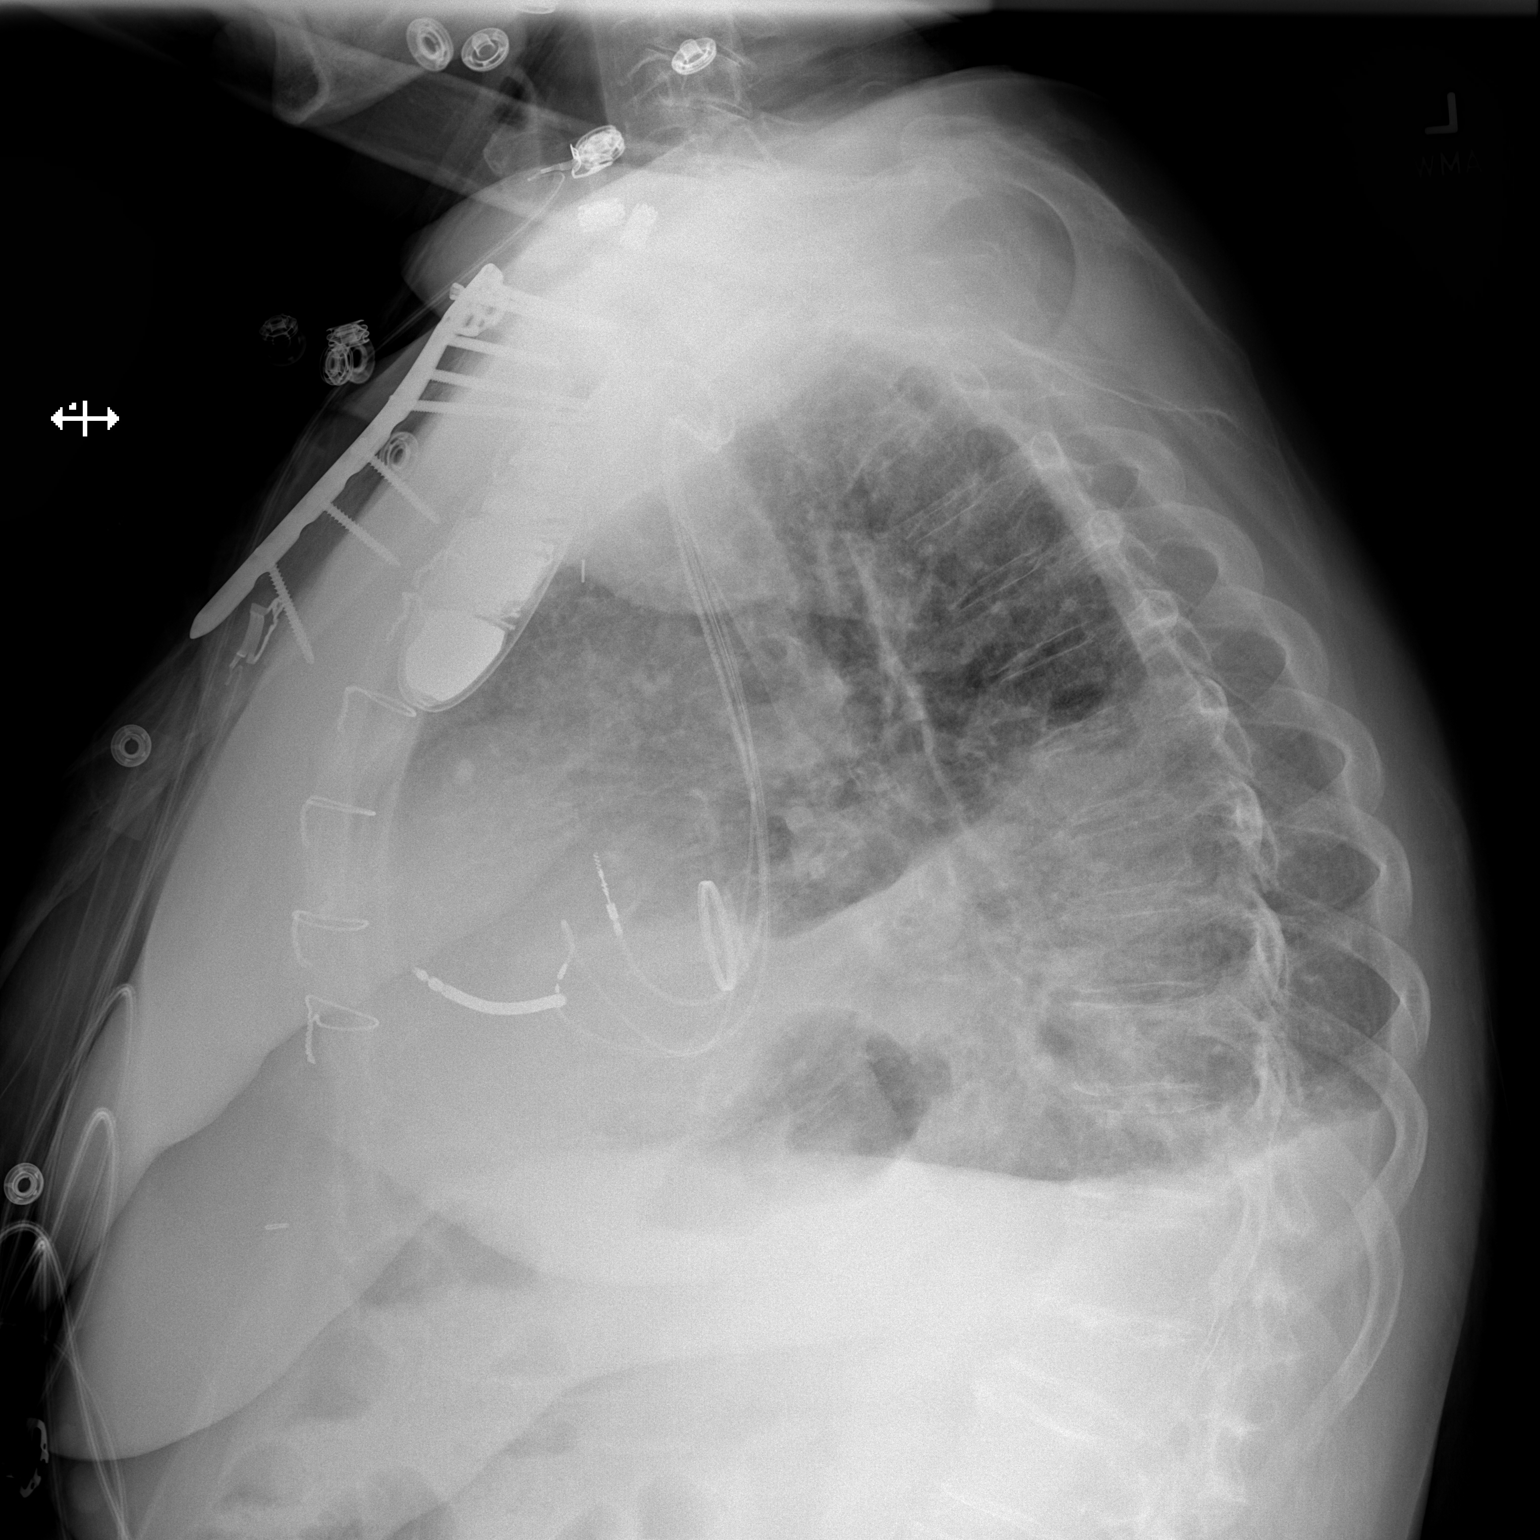

[2 of 2 positions shown; findings below may reference images not displayed]

FINDINGS: Pacer/AICD battery pack overlies the right chest wall with leads at
the cardiac apex and right atrium in stable position from prior.
Postsurgical changes related to prior CABG including intact and
aligned sternotomy wires and multiple surgical clips projecting over
the mediastinum. Stable cardiomediastinal contours.

Diffuse hazy interstitial opacities with more focal airspace disease
in the left lung base and a small to moderate left effusion. No
acute osseous or soft tissue abnormality. Surgical clips in the base
of the right neck
IMPRESSION: Diffuse hazy interstitial opacities with more focal airspace disease
in the left lung base and a small to moderate left pleural effusion.
Findings may reflect mild edema on a background of atelectatic
change.

## 2020-11-03 DEATH — deceased
